# Patient Record
Sex: Male | Born: 1960 | Race: White | Hispanic: No | Marital: Married | Smoking: Former smoker
Health system: Southern US, Community
[De-identification: ages and names within clinical notes are randomized; demographics above are authoritative.]

## PROBLEM LIST (undated history)

## (undated) DIAGNOSIS — M1990 Unspecified osteoarthritis, unspecified site: Secondary | ICD-10-CM

## (undated) DIAGNOSIS — I10 Essential (primary) hypertension: Secondary | ICD-10-CM

## (undated) DIAGNOSIS — Z9889 Other specified postprocedural states: Secondary | ICD-10-CM

## (undated) DIAGNOSIS — R112 Nausea with vomiting, unspecified: Secondary | ICD-10-CM

## (undated) DIAGNOSIS — G4730 Sleep apnea, unspecified: Secondary | ICD-10-CM

## (undated) DIAGNOSIS — Z9289 Personal history of other medical treatment: Secondary | ICD-10-CM

## (undated) DIAGNOSIS — C801 Malignant (primary) neoplasm, unspecified: Secondary | ICD-10-CM

## (undated) DIAGNOSIS — R0989 Other specified symptoms and signs involving the circulatory and respiratory systems: Secondary | ICD-10-CM

## (undated) HISTORY — PX: OTHER SURGICAL HISTORY: SHX169

## (undated) HISTORY — PX: PICC LINE PLACE PERIPHERAL (ARMC HX): HXRAD1248

## (undated) HISTORY — PX: JOINT REPLACEMENT: SHX530

## (undated) HISTORY — PX: TONSILLECTOMY: SUR1361

## (undated) HISTORY — PX: BACK SURGERY: SHX140

---

## 2000-10-14 HISTORY — PX: UVULOPALATOPHARYNGOPLASTY: SHX827

## 2010-04-12 ENCOUNTER — Ambulatory Visit (HOSPITAL_COMMUNITY): Admission: RE | Admit: 2010-04-12 | Discharge: 2010-04-12 | Payer: Self-pay | Admitting: Orthopedic Surgery

## 2010-05-15 ENCOUNTER — Inpatient Hospital Stay (HOSPITAL_COMMUNITY): Admission: RE | Admit: 2010-05-15 | Discharge: 2010-05-18 | Payer: Self-pay | Admitting: Orthopedic Surgery

## 2010-05-19 ENCOUNTER — Inpatient Hospital Stay (HOSPITAL_COMMUNITY): Admission: EM | Admit: 2010-05-19 | Discharge: 2010-05-21 | Payer: Self-pay | Admitting: Emergency Medicine

## 2010-07-02 ENCOUNTER — Encounter
Admission: RE | Admit: 2010-07-02 | Discharge: 2010-09-30 | Payer: Self-pay | Source: Home / Self Care | Attending: Orthopedic Surgery | Admitting: Orthopedic Surgery

## 2010-11-06 ENCOUNTER — Ambulatory Visit
Admission: RE | Admit: 2010-11-06 | Discharge: 2010-11-06 | Payer: Self-pay | Source: Home / Self Care | Admitting: Emergency Medicine

## 2010-11-06 DIAGNOSIS — I10 Essential (primary) hypertension: Secondary | ICD-10-CM | POA: Insufficient documentation

## 2010-11-15 NOTE — Assessment & Plan Note (Signed)
Summary: HEAD CONGESTION/COUGH/SORE THROAT/NH   Vital Signs:  Patient Profile:   50 Years Old Male CC:      congestion x 1 week, sore throat x 1 week Height:     68 inches Weight:      221 pounds O2 Sat:      100 % O2 treatment:    Room Air Temp:     97.6 degrees F oral Pulse rate:   70 / minute Resp:     16 per minute BP sitting:   114 / 73  (left arm) Cuff size:   large  Vitals Entered By: Lajean Saver RN (November 06, 2010 8:23 AM)                  Updated Prior Medication List: AMLODIPINE BESYLATE 10 MG TABS (AMLODIPINE BESYLATE) once daily CARVEDILOL 6.25 MG TABS (CARVEDILOL) bid HYDROCHLOROTHIAZIDE 25 MG TABS (HYDROCHLOROTHIAZIDE)  DIOVAN 40 MG TABS (VALSARTAN)   Current Allergies: ! KEFLEXHistory of Present Illness Chief Complaint: congestion x 1 week, sore throat x 1 week History of Present Illness: 50 Years Old Male complains of onset of cold symptoms for7 days.  [name] has been using no OTC meds + sore throat (main symptom) No cough No pleuritic pain No wheezing + nasal congestion No post-nasal drainage + sinus pain/pressure No chest congestion No itchy/red eyes No earache No hemoptysis No SOB No chills/sweats No fever No nausea No vomiting No abdominal pain No diarrhea No skin rashes No fatigue No myalgias + headache    REVIEW OF SYSTEMS Constitutional Symptoms      Denies fever, chills, night sweats, weight loss, weight gain, and fatigue.  Eyes       Denies change in vision, eye pain, eye discharge, glasses, contact lenses, and eye surgery. Ear/Nose/Throat/Mouth       Complains of sinus problems and sore throat.      Denies hearing loss/aids, change in hearing, ear pain, ear discharge, dizziness, frequent runny nose, frequent nose bleeds, hoarseness, and tooth pain or bleeding.      Comments: congestion Respiratory       Complains of dry cough.      Denies productive cough, wheezing, shortness of breath, asthma, bronchitis, and  emphysema/COPD.  Cardiovascular       Denies murmurs, chest pain, and tires easily with exhertion.    Gastrointestinal       Denies stomach pain, nausea/vomiting, diarrhea, constipation, blood in bowel movements, and indigestion. Genitourniary       Denies painful urination, kidney stones, and loss of urinary control. Neurological       Denies paralysis, seizures, and fainting/blackouts. Musculoskeletal       Denies muscle pain, joint pain, joint stiffness, decreased range of motion, redness, swelling, muscle weakness, and gout.  Skin       Denies bruising, unusual mles/lumps or sores, and hair/skin or nail changes.  Psych       Denies mood changes, temper/anger issues, anxiety/stress, speech problems, depression, and sleep problems. Other Comments: Patient c/o congestion x 1 week and sore throat x 2 days. Minimal cough. Denies fever. No OTC meds   Past History:  Past Medical History: Hypertension  Past Surgical History: Total hip replacement spinal fusion  Social History: Occupation: Med Best boy for [employer] Married Never Smoked Alcohol use-yes Drug use-no Smoking Status:  never Drug Use:  no Physical Exam General appearance: well developed, well nourished, no acute distress Ears: normal, no lesions or deformities Nasal: mucosa pink, nonedematous, no septal deviation,  turbinates normal Oral/Pharynx: clear PND, no erythema Chest/Lungs: no rale or rhonchi bilateral, breath sounds equal without effort.  Mild upper chest wheezes, clear with coughing Heart: regular rate and  rhythm, no murmur Skin: no obvious rashes or lesions MSE: oriented to time, place, and person Assessment New Problems: UPPER RESPIRATORY INFECTION, ACUTE (ICD-465.9) HYPERTENSION (ICD-401.9)   Plan New Medications/Changes: ZITHROMAX Z-PAK 250 MG TABS (AZITHROMYCIN) use as directed  #1 x 0, 11/06/2010, Hoyt Koch MD PREDNISONE (PAK) 10 MG TABS (PREDNISONE) 6 day pack, use as directed  #1 x 0,  11/06/2010, Hoyt Koch MD  New Orders: New Patient Level III [phone] Rapid Strep [32440] Planning Comments:   1)  Take the prescribed antibiotic as instructed (hold for a few days to see if you feel better with the Prednisone) 2)  Use nasal saline solution (over the counter) at least 3 times a day. 3)  Use over the counter decongestants like Zyrtec-D every 12 hours as needed to help with congestion (caution with sudafed since you have HTN) 4)  Can take tylenol every 6 hours or motrin every 8 hours for pain or fever. 5)  Follow up with your primary doctor  if no improvement in 5-7 days, sooner if increasing pain, fever, or new symptoms.    The patient and/or caregiver has been counseled thoroughly with regard to medications prescribed including dosage, schedule, interactions, rationale for use, and possible side effects and they verbalize understanding.  Diagnoses and expected course of recovery discussed and will return if not improved as expected or if the condition worsens. Patient and/or caregiver verbalized understanding.  Prescriptions: ZITHROMAX Z-PAK 250 MG TABS (AZITHROMYCIN) use as directed  #1 x 0   Entered and Authorized by:   Hoyt Koch MD   Signed by:   Hoyt Koch MD on 11/06/2010   Method used:   Print then Give to Patient   RxID:   [id number] PREDNISONE (PAK) 10 MG TABS (PREDNISONE) 6 day pack, use as directed  #1 x 0   Entered and Authorized by:   Hoyt Koch MD   Signed by:   Hoyt Koch MD on 11/06/2010   Method used:   Print then Give to Patient   RxID:   [id number]   Orders Added: 1)  New Patient Level III [18841] 2)  Rapid Strep [66063]    Laboratory Results  Date/Time Received: November 06, 2010 8:28 AM  Date/Time Reported: November 06, 2010 8:28 AM   Other Tests  Rapid Strep: negative  Kit Test Internal QC: Negative   (Normal Range: Negative)

## 2010-12-28 LAB — DIFFERENTIAL
Eosinophils Absolute: 0.1 10*3/uL (ref 0.0–0.7)
Lymphocytes Relative: 18 % (ref 12–46)
Lymphs Abs: 0.7 10*3/uL (ref 0.7–4.0)
Neutro Abs: 2.6 10*3/uL (ref 1.7–7.7)
Neutrophils Relative %: 64 % (ref 43–77)

## 2010-12-28 LAB — CBC
HCT: 26.7 % — ABNORMAL LOW (ref 39.0–52.0)
Hemoglobin: 7.8 g/dL — ABNORMAL LOW (ref 13.0–17.0)
Hemoglobin: 8.9 g/dL — ABNORMAL LOW (ref 13.0–17.0)
MCH: 34.3 pg — ABNORMAL HIGH (ref 26.0–34.0)
MCH: 35 pg — ABNORMAL HIGH (ref 26.0–34.0)
MCHC: 35.4 g/dL (ref 30.0–36.0)
Platelets: 129 10*3/uL — ABNORMAL LOW (ref 150–400)
Platelets: 236 10*3/uL (ref 150–400)
RBC: 2.23 MIL/uL — ABNORMAL LOW (ref 4.22–5.81)
RBC: 2.59 MIL/uL — ABNORMAL LOW (ref 4.22–5.81)
RDW: 15.2 % (ref 11.5–15.5)
RDW: 15.4 % (ref 11.5–15.5)
RDW: 16 % — ABNORMAL HIGH (ref 11.5–15.5)
WBC: 3.8 10*3/uL — ABNORMAL LOW (ref 4.0–10.5)

## 2010-12-28 LAB — URINALYSIS, ROUTINE W REFLEX MICROSCOPIC
Glucose, UA: NEGATIVE mg/dL
Hgb urine dipstick: NEGATIVE
Nitrite: NEGATIVE
Protein, ur: NEGATIVE mg/dL

## 2010-12-28 LAB — ANAEROBIC CULTURE

## 2010-12-28 LAB — TYPE AND SCREEN: Antibody Screen: NEGATIVE

## 2010-12-28 LAB — BASIC METABOLIC PANEL
BUN: 7 mg/dL (ref 6–23)
CO2: 23 mEq/L (ref 19–32)
CO2: 28 mEq/L (ref 19–32)
Calcium: 7.9 mg/dL — ABNORMAL LOW (ref 8.4–10.5)
Calcium: 8.1 mg/dL — ABNORMAL LOW (ref 8.4–10.5)
Chloride: 100 mEq/L (ref 96–112)
Chloride: 96 mEq/L (ref 96–112)
Creatinine, Ser: 0.92 mg/dL (ref 0.4–1.5)
Creatinine, Ser: 1 mg/dL (ref 0.4–1.5)
GFR calc Af Amer: >60 mL/min (ref >60)
GFR calc Af Amer: >60 mL/min (ref >60)
GFR calc non Af Amer: >60 mL/min (ref >60)
GFR calc non Af Amer: >60 mL/min (ref >60)
Glucose, Bld: 112 mg/dL — ABNORMAL HIGH (ref 70–99)
Potassium: 3 mEq/L — ABNORMAL LOW (ref 3.5–5.1)
Potassium: 3.9 mEq/L (ref 3.5–5.1)
Sodium: 133 mEq/L — ABNORMAL LOW (ref 135–145)

## 2010-12-28 LAB — APTT: aPTT: 45 seconds — ABNORMAL HIGH (ref 24–37)

## 2010-12-28 LAB — CULTURE, BLOOD (ROUTINE X 2)

## 2010-12-28 LAB — ABO/RH: ABO/RH(D): A NEG

## 2010-12-28 LAB — PREPARE RBC (CROSSMATCH)

## 2010-12-29 LAB — URINALYSIS, ROUTINE W REFLEX MICROSCOPIC
Glucose, UA: NEGATIVE mg/dL
Ketones, ur: NEGATIVE mg/dL
Nitrite: NEGATIVE
Protein, ur: NEGATIVE mg/dL
pH: 6 (ref 5.0–8.0)

## 2010-12-29 LAB — BASIC METABOLIC PANEL
CO2: 26 mEq/L (ref 19–32)
Calcium: 9.1 mg/dL (ref 8.4–10.5)
Creatinine, Ser: 0.79 mg/dL (ref 0.4–1.5)
GFR calc non Af Amer: >60 mL/min (ref >60)
Glucose, Bld: 91 mg/dL (ref 70–99)
Sodium: 132 mEq/L — ABNORMAL LOW (ref 135–145)

## 2010-12-29 LAB — DIFFERENTIAL
Basophils Absolute: 0 10*3/uL (ref 0.0–0.1)
Basophils Relative: 1 % (ref 0–1)
Eosinophils Absolute: 0 10*3/uL (ref 0.0–0.7)
Monocytes Relative: 16 % — ABNORMAL HIGH (ref 3–12)
Neutro Abs: 1.5 10*3/uL — ABNORMAL LOW (ref 1.7–7.7)
Neutrophils Relative %: 59 % (ref 43–77)

## 2010-12-29 LAB — CBC
Hemoglobin: 12 g/dL — ABNORMAL LOW (ref 13.0–17.0)
MCH: 34.2 pg — ABNORMAL HIGH (ref 26.0–34.0)
MCHC: 35.9 g/dL (ref 30.0–36.0)
Platelets: 245 10*3/uL (ref 150–400)
RDW: 13.9 % (ref 11.5–15.5)

## 2010-12-29 LAB — APTT: aPTT: 36 seconds (ref 24–37)

## 2010-12-29 LAB — SURGICAL PCR SCREEN: Staphylococcus aureus: POSITIVE — AB

## 2010-12-29 LAB — PROTIME-INR
INR: 1.06 (ref 0.00–1.49)
Prothrombin Time: 13.7 seconds (ref 11.6–15.2)

## 2015-05-30 ENCOUNTER — Other Ambulatory Visit (HOSPITAL_COMMUNITY): Payer: Self-pay | Admitting: Orthopedic Surgery

## 2015-05-30 DIAGNOSIS — M25552 Pain in left hip: Secondary | ICD-10-CM

## 2015-06-02 ENCOUNTER — Encounter (HOSPITAL_COMMUNITY)
Admission: RE | Admit: 2015-06-02 | Discharge: 2015-06-02 | Disposition: A | Discharge status: Home / Self Care | Payer: Worker's Compensation | Source: Ambulatory Visit | Attending: Orthopedic Surgery | Admitting: Orthopedic Surgery

## 2015-06-02 ENCOUNTER — Ambulatory Visit (HOSPITAL_COMMUNITY)
Admission: RE | Admit: 2015-06-02 | Discharge: 2015-06-02 | Disposition: A | Discharge status: Home / Self Care | Payer: Worker's Compensation | Source: Ambulatory Visit | Attending: Orthopedic Surgery | Admitting: Orthopedic Surgery

## 2015-06-02 DIAGNOSIS — M25552 Pain in left hip: Secondary | ICD-10-CM | POA: Diagnosis present

## 2015-06-02 DIAGNOSIS — Z96642 Presence of left artificial hip joint: Secondary | ICD-10-CM | POA: Diagnosis not present

## 2015-06-02 MED ORDER — TECHNETIUM TC 99M MEDRONATE IV KIT
25.0000 | PACK | Freq: Once | INTRAVENOUS | Status: AC | PRN
  Administered 2015-06-02: 26.1 via INTRAVENOUS

## 2015-07-06 NOTE — Patient Instructions (Addendum)
Brandom Kerwin  07/06/2015   Your procedure is scheduled on: July 17, 2015  Report to [facility] Main  Entrance take [place]  elevators to 3rd floor to  [employer] at  [employer].   Call this number if you have problems the morning of surgery [phone]   Remember: ONLY 1 PERSON MAY GO WITH YOU TO SHORT STAY TO GET  READY MORNING OF YOUR SURGERY.  Do not eat food after midnight Sunday night.  Follow clear liquid diet until 7:30 a.m. Monday morning.  The nothing by mouth after 7:30 a.m. Morning of surgery    Take these medicines the morning of surgery with A SIP OF WATER: Carvedilol (coreg)                                 You may not have any metal on your body including hair pins and              piercings  Do not wear jewelry, lotions, powders or perfumes, deodorant                       Men may shave face and neck.   Do not bring valuables to the hospital. [place].  Contacts, dentures or bridgework may not be worn into surgery.  Leave suitcase in the car. After surgery it may be brought to your room.    Special Instructions: coughing and deep breathing exercises, leg exercises              Please read over the following fact sheets you were given: _____________________________________________________________________             [facility] - Preparing for Surgery Before surgery, you can play an important role.  Because skin is not sterile, your skin needs to be as free of germs as possible.  You can reduce the number of germs on your skin by washing with CHG (chlorahexidine gluconate) soap before surgery.  CHG is an antiseptic cleaner which kills germs and bonds with the skin to continue killing germs even after washing. Please DO NOT use if you have an allergy to CHG or antibacterial soaps.  If your skin becomes reddened/irritated stop using the CHG and inform your nurse when you arrive at Short  Stay. Do not shave (including legs and underarms) for at least 48 hours prior to the first CHG shower.  You may shave your face/neck. Please follow these instructions carefully:  1.  Shower with CHG Soap the night before surgery and the  morning of Surgery.  2.  If you choose to wash your hair, wash your hair first as usual with your  normal  shampoo.  3.  After you shampoo, rinse your hair and body thoroughly to remove the  shampoo.                           4.  Use CHG as you would any other liquid soap.  You can apply chg directly  to the skin and wash                       Gently  with a scrungie or clean washcloth.  5.  Apply the CHG Soap to your body ONLY FROM THE NECK DOWN.   Do not use on face/ open                           Wound or open sores. Avoid contact with eyes, ears mouth and genitals (private parts).                       Wash face,  Genitals (private parts) with your normal soap.             6.  Wash thoroughly, paying special attention to the area where your surgery  will be performed.  7.  Thoroughly rinse your body with warm water from the neck down.  8.  DO NOT shower/wash with your normal soap after using and rinsing off  the CHG Soap.                9.  Pat yourself dry with a clean towel.            10.  Wear clean pajamas.            11.  Place clean sheets on your bed the night of your first shower and do not  sleep with pets. Day of Surgery : Do not apply any lotions/deodorants the morning of surgery.  Please wear clean clothes to the hospital/surgery center.  FAILURE TO FOLLOW THESE INSTRUCTIONS MAY RESULT IN THE CANCELLATION OF YOUR SURGERY PATIENT SIGNATURE_________________________________  NURSE SIGNATURE__________________________________  ________________________________________________________________________  WHAT IS A BLOOD TRANSFUSION? Blood Transfusion Information  A transfusion is the replacement of blood or some of its parts. Blood is made up of  multiple cells which provide different functions.  Red blood cells carry oxygen and are used for blood loss replacement.  White blood cells fight against infection.  Platelets control bleeding.  Plasma helps clot blood.  Other blood products are available for specialized needs, such as hemophilia or other clotting disorders. BEFORE THE TRANSFUSION  Who gives blood for transfusions?   Healthy volunteers who are fully evaluated to make sure their blood is safe. This is blood bank blood. Transfusion therapy is the safest it has ever been in the practice of medicine. Before blood is taken from a donor, a complete history is taken to make sure that person has no history of diseases nor engages in risky social behavior (examples are intravenous drug use or sexual activity with multiple partners). The donor's travel history is screened to minimize risk of transmitting infections, such as malaria. The donated blood is tested for signs of infectious diseases, such as HIV and hepatitis. The blood is then tested to be sure it is compatible with you in order to minimize the chance of a transfusion reaction. If you or a relative donates blood, this is often done in anticipation of surgery and is not appropriate for emergency situations. It takes many days to process the donated blood. RISKS AND COMPLICATIONS Although transfusion therapy is very safe and saves many lives, the main dangers of transfusion include:  1. Getting an infectious disease. 2. Developing a transfusion reaction. This is an allergic reaction to something in the blood you were given. Every precaution is taken to prevent this. The decision to have a blood transfusion has been considered carefully by your caregiver before blood is given. Blood is not given unless the benefits outweigh the  risks. AFTER THE TRANSFUSION  Right after receiving a blood transfusion, you will usually feel much better and more energetic. This is especially true if  your red blood cells have gotten low (anemic). The transfusion raises the level of the red blood cells which carry oxygen, and this usually causes an energy increase.  The nurse administering the transfusion will monitor you carefully for complications. HOME CARE INSTRUCTIONS  No special instructions are needed after a transfusion. You may find your energy is better. Speak with your caregiver about any limitations on activity for underlying diseases you may have. SEEK MEDICAL CARE IF:   Your condition is not improving after your transfusion.  You develop redness or irritation at the intravenous (IV) site. SEEK IMMEDIATE MEDICAL CARE IF:  Any of the following symptoms occur over the next 12 hours:  Shaking chills.  You have a temperature by mouth above 102 F (38.9 C), not controlled by medicine.  Chest, back, or muscle pain.  People around you feel you are not acting correctly or are confused.  Shortness of breath or difficulty breathing.  Dizziness and fainting.  You get a rash or develop hives.  You have a decrease in urine output.  Your urine turns a dark color or changes to pink, red, or brown. Any of the following symptoms occur over the next 10 days:  You have a temperature by mouth above 102 F (38.9 C), not controlled by medicine.  Shortness of breath.  Weakness after normal activity.  The white part of the eye turns yellow (jaundice).  You have a decrease in the amount of urine or are urinating less often.  Your urine turns a dark color or changes to pink, red, or brown. Document Released: 09/27/2000 Document Revised: 12/23/2011 Document Reviewed: 05/16/2008 ExitCare Patient Information 2014 [place].  _______________________________________________________________________  Incentive Spirometer  An incentive spirometer is a tool that can help keep your lungs clear and active. This tool measures how well you are filling your lungs with each breath.  Taking long deep breaths may help reverse or decrease the chance of developing breathing (pulmonary) problems (especially infection) following:  A long period of time when you are unable to move or be active. BEFORE THE PROCEDURE   If the spirometer includes an indicator to show your best effort, your nurse or respiratory therapist will set it to a desired goal.  If possible, sit up straight or lean slightly forward. Try not to slouch.  Hold the incentive spirometer in an upright position. INSTRUCTIONS FOR USE  3. Sit on the edge of your bed if possible, or sit up as far as you can in bed or on a chair. 4. Hold the incentive spirometer in an upright position. 5. Breathe out normally. 6. Place the mouthpiece in your mouth and seal your lips tightly around it. 7. Breathe in slowly and as deeply as possible, raising the piston or the ball toward the top of the column. 8. Hold your breath for 3-5 seconds or for as long as possible. Allow the piston or ball to fall to the bottom of the column. 9. Remove the mouthpiece from your mouth and breathe out normally. 10. Rest for a few seconds and repeat Steps 1 through 7 at least 10 times every 1-2 hours when you are awake. Take your time and take a few normal breaths between deep breaths. 11. The spirometer may include an indicator to show your best effort. Use the indicator as a goal to work toward  during each repetition. 12. After each set of 10 deep breaths, practice coughing to be sure your lungs are clear. If you have an incision (the cut made at the time of surgery), support your incision when coughing by placing a pillow or rolled up towels firmly against it. Once you are able to get out of bed, walk around indoors and cough well. You may stop using the incentive spirometer when instructed by your caregiver.  RISKS AND COMPLICATIONS  Take your time so you do not get dizzy or light-headed.  If you are in pain, you may need to take or ask for  pain medication before doing incentive spirometry. It is harder to take a deep breath if you are having pain. AFTER USE  Rest and breathe slowly and easily.  It can be helpful to keep track of a log of your progress. Your caregiver can provide you with a simple table to help with this. If you are using the spirometer at home, follow these instructions: [place] IF:   You are having difficultly using the spirometer.  You have trouble using the spirometer as often as instructed.  Your pain medication is not giving enough relief while using the spirometer.  You develop fever of 100.5 F (38.1 C) or higher. SEEK IMMEDIATE MEDICAL CARE IF:   You cough up bloody sputum that had not been present before.  You develop fever of 102 F (38.9 C) or greater.  You develop worsening pain at or near the incision site. MAKE SURE YOU:   Understand these instructions.  Will watch your condition.  Will get help right away if you are not doing well or get worse. Document Released: 02/10/2007 Document Revised: 12/23/2011 Document Reviewed: 04/13/2007 ExitCare Patient Information 2014 ExitCare, [place].   ________________________________________________________________________    CLEAR LIQUID DIET   Foods Allowed                                                                     Foods Excluded  Coffee and tea, regular and decaf                             liquids that you cannot  Plain Jell-O in any flavor                                             see through such as: Fruit ices (not with fruit pulp)                                     milk, soups, orange juice  Iced Popsicles                                    All solid food Carbonated beverages, regular and diet  Cranberry, grape and apple juices Sports drinks like Gatorade Lightly seasoned clear broth or consume(fat free) Sugar, honey syrup  Sample Menu Breakfast                                 Lunch                                     Supper Cranberry juice                    Beef broth                            Chicken broth Jell-O                                     Grape juice                           Apple juice Coffee or tea                        Jell-O                                      Popsicle                                                Coffee or tea                        Coffee or tea  _____________________________________________________________________

## 2015-07-10 ENCOUNTER — Encounter (HOSPITAL_COMMUNITY): Payer: Self-pay

## 2015-07-10 ENCOUNTER — Encounter (INDEPENDENT_AMBULATORY_CARE_PROVIDER_SITE_OTHER): Payer: Self-pay

## 2015-07-10 ENCOUNTER — Encounter (HOSPITAL_COMMUNITY)
Admission: RE | Admit: 2015-07-10 | Discharge: 2015-07-10 | Disposition: A | Discharge status: Home / Self Care | Payer: Worker's Compensation | Source: Ambulatory Visit | Attending: Orthopedic Surgery | Admitting: Orthopedic Surgery

## 2015-07-10 DIAGNOSIS — Z9660 Presence of unspecified orthopedic joint implant: Secondary | ICD-10-CM | POA: Diagnosis not present

## 2015-07-10 DIAGNOSIS — Z01818 Encounter for other preprocedural examination: Secondary | ICD-10-CM | POA: Insufficient documentation

## 2015-07-10 HISTORY — DX: Nausea with vomiting, unspecified: R11.2

## 2015-07-10 HISTORY — DX: Essential (primary) hypertension: I10

## 2015-07-10 HISTORY — DX: Other specified postprocedural states: Z98.890

## 2015-07-10 LAB — URINALYSIS, ROUTINE W REFLEX MICROSCOPIC
BILIRUBIN URINE: NEGATIVE
Glucose, UA: NEGATIVE mg/dL
HGB URINE DIPSTICK: NEGATIVE
Ketones, ur: NEGATIVE mg/dL
Leukocytes, UA: NEGATIVE
Nitrite: NEGATIVE
PROTEIN: NEGATIVE mg/dL
Specific Gravity, Urine: 1.012 (ref 1.005–1.030)
UROBILINOGEN UA: 0.2 mg/dL (ref 0.0–1.0)
pH: 6 (ref 5.0–8.0)

## 2015-07-10 LAB — BASIC METABOLIC PANEL
ANION GAP: 7 (ref 5–15)
BUN: 16 mg/dL (ref 6–20)
CALCIUM: 9 mg/dL (ref 8.9–10.3)
CO2: 22 mmol/L (ref 22–32)
CREATININE: 0.71 mg/dL (ref 0.61–1.24)
Chloride: 101 mmol/L (ref 101–111)
GFR calc Af Amer: >60 mL/min (ref >60)
GFR calc non Af Amer: >60 mL/min (ref >60)
GLUCOSE: 94 mg/dL (ref 65–99)
Potassium: 4.4 mmol/L (ref 3.5–5.1)
Sodium: 130 mmol/L — ABNORMAL LOW (ref 135–145)

## 2015-07-10 LAB — SURGICAL PCR SCREEN
MRSA, PCR: NEGATIVE
STAPHYLOCOCCUS AUREUS: NEGATIVE

## 2015-07-10 LAB — APTT: aPTT: 33 seconds (ref 24–37)

## 2015-07-10 LAB — PROTIME-INR
INR: 1.12 (ref 0.00–1.49)
Prothrombin Time: 14.6 seconds (ref 11.6–15.2)

## 2015-07-10 NOTE — Progress Notes (Signed)
06-21-15 - EKG - in chart 06-21-15 - CBC w/diff - in chart 06-21-15 - CMP - in chart  (drew BMP at preop visit on 07-10-15 due to increased sodium level on 06-21-15) 06-21-15 - LOV - surgical clearance - D. [place], [place] ([facility] - care everywhere - EPIC

## 2015-07-14 NOTE — Progress Notes (Signed)
07-14-15 - Pt. Notified of surgery time change (from 1400 to 1445).  Advised pt. To be at short stay at 1245 on 07-17-15.

## 2015-07-16 MED ORDER — SODIUM CHLORIDE 0.9 % IV SOLN
1500.0000 mg | INTRAVENOUS | Status: AC
  Administered 2015-07-17: 1500 mg via INTRAVENOUS
  Filled 2015-07-16: qty 1500

## 2015-07-16 NOTE — H&P (Signed)
TOTAL HIP REVISION ADMISSION H&P  Patient is admitted for left revision total hip arthroplasty.  Subjective:  Chief Complaint: Failed left THA  HPI: [name], 54 y.o. 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 The indications for the revision total hip arthroplasty are fracture or mechanical failure of one or more component.  Onset of symptoms was gradual starting >10 years ago with gradually worsening course since that time.  Prior procedures on the left hip include arthroplasty.  Patient currently rates pain in the left hip at 9 out of 10 with activity.  There is night pain, worsening of pain with activity and weight bearing, trendelenberg gait, pain that interfers with activities of daily living and pain with passive range of motion. Patient has evidence of malalignment by imaging studies.  This condition presents safety issues increasing the risk of falls.   There is no current active infection.   Risks, benefits and expectations were discussed with the patient.  Risks including but not limited to the risk of anesthesia, blood clots, nerve damage, blood vessel damage, failure of the prosthesis, infection and up to and including death.  Patient understand the risks, benefits and expectations and wishes to proceed with surgery.   PCP: No primary care provider on file.  D/C Plans:      Home with HHPT  Post-op Meds:       No Rx given   Tranexamic Acid:      To be given - IV  Decadron:      Is to be given  FYI:     ASA post-op  Norco post-op    Patient Active Problem List   Diagnosis Date Noted  . HYPERTENSION 11/06/2010   Past Medical History  Diagnosis Date  . PONV (postoperative nausea and vomiting)   . Hypertension   . Pneumonia     hx of    Past  Surgical History  Procedure Laterality Date  . Tonsillectomy      adnoids removed  . Back surgery      four back surgeries (3 fusions)  . Joint replacement      3 surgeries on right hip, 1 on left hip  . Right knee surgery - torn maniscus      No prescriptions prior to admission   Allergies  Allergen Reactions  . Ace Inhibitors Rash  . Cephalexin Rash  . Doxycycline Rash    Social History  Substance Use Topics  . Smoking status: Former Smoker    Quit date: 10/14/2004  . Smokeless tobacco: Never Used  . Alcohol Use: 1.2 oz/week    2 Cans of beer per week     Comment: occasionally    No family history on file.    Review of Systems  Constitutional: Negative.   HENT: Negative.   Eyes: Negative.   Respiratory: Negative.   Cardiovascular: Negative.   Gastrointestinal: Negative.   Genitourinary: Negative.   Musculoskeletal: Positive for back pain and joint pain.  Skin: Negative.   Neurological: Negative.   Endo/Heme/Allergies: Negative.   Psychiatric/Behavioral: Negative.     Objective:  Physical Exam  Constitutional: He is oriented to person, place, and time. He appears well-developed.  HENT:  Head: Normocephalic.  Eyes: Pupils are equal, round, and reactive to light.  Neck: Neck supple. No  JVD present. No tracheal deviation present. No thyromegaly present.  Cardiovascular: Normal rate, regular rhythm, normal heart sounds and intact distal pulses.   Respiratory: Effort normal and breath sounds normal. No stridor. No respiratory distress. He has no wheezes.  GI: Soft. There is no tenderness. There is no guarding.  Musculoskeletal:       Left hip: He exhibits decreased range of motion, decreased strength, tenderness, bony tenderness and laceration (healed previous incision). He exhibits no swelling and no deformity.  Lymphadenopathy:    He has no cervical adenopathy.  Neurological: He is alert and oriented to person, place, and time.  Skin: Skin is warm and dry.   Psychiatric: He has a normal mood and affect.      Labs:  Estimated body mass index is 33.61 kg/(m^2) as calculated from the following:   Height as of 11/06/10: 5\' 8"  (1.727 m).   Weight as of 11/06/10: 100.245 kg (221 lb).  Imaging Review:  Plain radiographs demonstrate previous THA of the left hip(s). There is evidence of failure of the components.The bone quality appears to be good for age and reported activity level.   Assessment/Plan:  Left hip with failed previous arthroplasty.  The patient history, physical examination, clinical judgement of the provider and imaging studies are consistent with failure of the left hip(s), previous total hip arthroplasty. Revision total hip arthroplasty is deemed medically necessary. The treatment options including medical management, injection therapy, arthroscopy and arthroplasty were discussed at length. The risks and benefits of total hip arthroplasty were presented and reviewed. The risks due to aseptic loosening, infection, stiffness, dislocation/subluxation,  thromboembolic complications and other imponderables were discussed.  The patient acknowledged the explanation, agreed to proceed with the plan and consent was signed. Patient is being admitted for inpatient treatment for surgery, pain control, PT, OT, prophylactic antibiotics, VTE prophylaxis, progressive ambulation and ADL's and discharge planning. The patient is planning to be discharged home with home health services.      West Pugh Toris Laverdiere   PA-C  07/16/2015, 10:12 PM

## 2015-07-17 ENCOUNTER — Inpatient Hospital Stay (HOSPITAL_COMMUNITY): Payer: Worker's Compensation | Admitting: Anesthesiology

## 2015-07-17 ENCOUNTER — Encounter (HOSPITAL_COMMUNITY)
Admission: RE | Disposition: A | Discharge status: Home Health | Payer: Self-pay | Source: Ambulatory Visit | Attending: Orthopedic Surgery

## 2015-07-17 ENCOUNTER — Inpatient Hospital Stay (HOSPITAL_COMMUNITY)
Admission: RE | Admit: 2015-07-17 | Discharge: 2015-07-19 | DRG: 468 | Disposition: A | Discharge status: Home Health | Payer: Worker's Compensation | Source: Ambulatory Visit | Attending: Orthopedic Surgery | Admitting: Orthopedic Surgery

## 2015-07-17 ENCOUNTER — Inpatient Hospital Stay (HOSPITAL_COMMUNITY): Payer: Worker's Compensation

## 2015-07-17 ENCOUNTER — Encounter (HOSPITAL_COMMUNITY): Payer: Self-pay | Admitting: *Deleted

## 2015-07-17 DIAGNOSIS — Z96649 Presence of unspecified artificial hip joint: Secondary | ICD-10-CM

## 2015-07-17 DIAGNOSIS — Z01812 Encounter for preprocedural laboratory examination: Secondary | ICD-10-CM | POA: Diagnosis not present

## 2015-07-17 DIAGNOSIS — E669 Obesity, unspecified: Secondary | ICD-10-CM | POA: Diagnosis present

## 2015-07-17 DIAGNOSIS — T84091A Other mechanical complication of internal left hip prosthesis, initial encounter: Secondary | ICD-10-CM | POA: Diagnosis present

## 2015-07-17 DIAGNOSIS — Z881 Allergy status to other antibiotic agents status: Secondary | ICD-10-CM | POA: Diagnosis not present

## 2015-07-17 DIAGNOSIS — Z6835 Body mass index (BMI) 35.0-35.9, adult: Secondary | ICD-10-CM

## 2015-07-17 DIAGNOSIS — I10 Essential (primary) hypertension: Secondary | ICD-10-CM | POA: Diagnosis present

## 2015-07-17 DIAGNOSIS — Z87891 Personal history of nicotine dependence: Secondary | ICD-10-CM | POA: Diagnosis not present

## 2015-07-17 DIAGNOSIS — Y792 Prosthetic and other implants, materials and accessory orthopedic devices associated with adverse incidents: Secondary | ICD-10-CM | POA: Diagnosis present

## 2015-07-17 DIAGNOSIS — M25552 Pain in left hip: Secondary | ICD-10-CM | POA: Diagnosis present

## 2015-07-17 DIAGNOSIS — T84018A Broken internal joint prosthesis, other site, initial encounter: Secondary | ICD-10-CM

## 2015-07-17 HISTORY — PX: ANTERIOR HIP REVISION: SHX6527

## 2015-07-17 LAB — TYPE AND SCREEN
ABO/RH(D): A NEG
Antibody Screen: NEGATIVE

## 2015-07-17 SURGERY — REVISION, TOTAL ARTHROPLASTY, HIP, ANTERIOR APPROACH
Anesthesia: General | Site: Hip | Laterality: Left

## 2015-07-17 MED ORDER — IRBESARTAN 300 MG PO TABS
300.0000 mg | ORAL_TABLET | Freq: Every day | ORAL | Status: DC
  Administered 2015-07-17 – 2015-07-19 (×3): 300 mg via ORAL
  Filled 2015-07-17 (×3): qty 1

## 2015-07-17 MED ORDER — ONDANSETRON HCL 4 MG/2ML IJ SOLN
INTRAMUSCULAR | Status: DC | PRN
  Administered 2015-07-17: 4 mg via INTRAVENOUS

## 2015-07-17 MED ORDER — DEXAMETHASONE SODIUM PHOSPHATE 10 MG/ML IJ SOLN
INTRAMUSCULAR | Status: DC | PRN
  Administered 2015-07-17: 10 mg via INTRAVENOUS

## 2015-07-17 MED ORDER — FERROUS SULFATE 325 (65 FE) MG PO TABS
325.0000 mg | ORAL_TABLET | Freq: Three times a day (TID) | ORAL | Status: DC
  Administered 2015-07-18 – 2015-07-19 (×4): 325 mg via ORAL
  Filled 2015-07-17 (×7): qty 1

## 2015-07-17 MED ORDER — DIPHENHYDRAMINE HCL 25 MG PO CAPS
25.0000 mg | ORAL_CAPSULE | Freq: Four times a day (QID) | ORAL | Status: DC | PRN
  Administered 2015-07-18: 25 mg via ORAL
  Filled 2015-07-17: qty 1

## 2015-07-17 MED ORDER — MEPERIDINE HCL 50 MG/ML IJ SOLN: 6.2500 mg | INTRAMUSCULAR | Status: DC | PRN

## 2015-07-17 MED ORDER — FENTANYL CITRATE (PF) 100 MCG/2ML IJ SOLN
INTRAMUSCULAR | Status: DC | PRN
  Administered 2015-07-17: 100 ug via INTRAVENOUS
  Administered 2015-07-17: 50 ug via INTRAVENOUS
  Administered 2015-07-17 (×2): 100 ug via INTRAVENOUS

## 2015-07-17 MED ORDER — PROMETHAZINE HCL 25 MG/ML IJ SOLN: 6.2500 mg | INTRAMUSCULAR | Status: DC | PRN

## 2015-07-17 MED ORDER — NEOSTIGMINE METHYLSULFATE 10 MG/10ML IV SOLN
INTRAVENOUS | Status: AC
  Filled 2015-07-17: qty 1

## 2015-07-17 MED ORDER — FENTANYL CITRATE (PF) 100 MCG/2ML IJ SOLN
INTRAMUSCULAR | Status: AC
  Filled 2015-07-17: qty 4

## 2015-07-17 MED ORDER — CELECOXIB 200 MG PO CAPS
200.0000 mg | ORAL_CAPSULE | Freq: Two times a day (BID) | ORAL | Status: DC
  Administered 2015-07-17 – 2015-07-19 (×4): 200 mg via ORAL
  Filled 2015-07-17 (×5): qty 1

## 2015-07-17 MED ORDER — DOCUSATE SODIUM 100 MG PO CAPS
100.0000 mg | ORAL_CAPSULE | Freq: Two times a day (BID) | ORAL | Status: DC
  Administered 2015-07-17 – 2015-07-19 (×4): 100 mg via ORAL

## 2015-07-17 MED ORDER — LIDOCAINE HCL (CARDIAC) 20 MG/ML IV SOLN
INTRAVENOUS | Status: DC | PRN
  Administered 2015-07-17: 100 mg via INTRAVENOUS

## 2015-07-17 MED ORDER — LACTATED RINGERS IV SOLN
INTRAVENOUS | Status: DC
  Administered 2015-07-17 (×3): via INTRAVENOUS

## 2015-07-17 MED ORDER — ROCURONIUM BROMIDE 100 MG/10ML IV SOLN
INTRAVENOUS | Status: AC
  Filled 2015-07-17: qty 1

## 2015-07-17 MED ORDER — EPHEDRINE SULFATE 50 MG/ML IJ SOLN
INTRAMUSCULAR | Status: DC | PRN
  Administered 2015-07-17: 5 mg via INTRAVENOUS

## 2015-07-17 MED ORDER — PROPOFOL 10 MG/ML IV BOLUS
INTRAVENOUS | Status: DC | PRN
  Administered 2015-07-17: 170 mg via INTRAVENOUS
  Administered 2015-07-17: 200 mg via INTRAVENOUS

## 2015-07-17 MED ORDER — DEXAMETHASONE SODIUM PHOSPHATE 10 MG/ML IJ SOLN
INTRAMUSCULAR | Status: AC
  Filled 2015-07-17: qty 1

## 2015-07-17 MED ORDER — FENTANYL CITRATE (PF) 100 MCG/2ML IJ SOLN
INTRAMUSCULAR | Status: AC
  Filled 2015-07-17: qty 2

## 2015-07-17 MED ORDER — MIDAZOLAM HCL 5 MG/5ML IJ SOLN
INTRAMUSCULAR | Status: DC | PRN
  Administered 2015-07-17: 2 mg via INTRAVENOUS

## 2015-07-17 MED ORDER — PHENOL 1.4 % MT LIQD: 1.0000 | OROMUCOSAL | Status: DC | PRN

## 2015-07-17 MED ORDER — GLYCOPYRROLATE 0.2 MG/ML IJ SOLN
INTRAMUSCULAR | Status: AC
  Filled 2015-07-17: qty 1

## 2015-07-17 MED ORDER — HYDROMORPHONE HCL 1 MG/ML IJ SOLN
0.5000 mg | INTRAMUSCULAR | Status: DC | PRN
  Administered 2015-07-17: 0.5 mg via INTRAVENOUS
  Administered 2015-07-17: 1 mg via INTRAVENOUS
  Filled 2015-07-17 (×2): qty 1

## 2015-07-17 MED ORDER — MENTHOL 3 MG MT LOZG: 1.0000 | LOZENGE | OROMUCOSAL | Status: DC | PRN

## 2015-07-17 MED ORDER — LACTATED RINGERS IV SOLN: INTRAVENOUS | Status: DC

## 2015-07-17 MED ORDER — INFLUENZA VAC SPLIT QUAD 0.5 ML IM SUSY
0.5000 mL | PREFILLED_SYRINGE | INTRAMUSCULAR | Status: DC
  Filled 2015-07-17 (×2): qty 0.5

## 2015-07-17 MED ORDER — SODIUM CHLORIDE 0.9 % IV SOLN
100.0000 mL/h | INTRAVENOUS | Status: DC
  Administered 2015-07-17: 100 mL/h via INTRAVENOUS
  Filled 2015-07-17 (×6): qty 1000

## 2015-07-17 MED ORDER — SODIUM CHLORIDE 0.9 % IR SOLN
Status: DC | PRN
  Administered 2015-07-17: 1000 mL

## 2015-07-17 MED ORDER — CETYLPYRIDINIUM CHLORIDE 0.05 % MT LIQD
7.0000 mL | Freq: Two times a day (BID) | OROMUCOSAL | Status: DC
  Administered 2015-07-17 – 2015-07-19 (×4): 7 mL via OROMUCOSAL

## 2015-07-17 MED ORDER — BISACODYL 10 MG RE SUPP: 10.0000 mg | Freq: Every day | RECTAL | Status: DC | PRN

## 2015-07-17 MED ORDER — GLYCOPYRROLATE 0.2 MG/ML IJ SOLN
INTRAMUSCULAR | Status: AC
  Filled 2015-07-17: qty 2

## 2015-07-17 MED ORDER — METHOCARBAMOL 1000 MG/10ML IJ SOLN
500.0000 mg | Freq: Four times a day (QID) | INTRAVENOUS | Status: DC | PRN
  Administered 2015-07-17: 500 mg via INTRAVENOUS
  Filled 2015-07-17 (×2): qty 5

## 2015-07-17 MED ORDER — MAGNESIUM CITRATE PO SOLN: 1.0000 | Freq: Once | ORAL | Status: DC | PRN

## 2015-07-17 MED ORDER — HYDROCODONE-ACETAMINOPHEN 7.5-325 MG PO TABS
1.0000 | ORAL_TABLET | ORAL | Status: DC
  Administered 2015-07-17: 1 via ORAL
  Administered 2015-07-18 (×6): 2 via ORAL
  Administered 2015-07-19 (×2): 1 via ORAL
  Administered 2015-07-19: 2 via ORAL
  Administered 2015-07-19: 1 via ORAL
  Filled 2015-07-17: qty 1
  Filled 2015-07-17 (×5): qty 2
  Filled 2015-07-17 (×2): qty 1
  Filled 2015-07-17 (×2): qty 2
  Filled 2015-07-17: qty 1

## 2015-07-17 MED ORDER — METOCLOPRAMIDE HCL 5 MG PO TABS
5.0000 mg | ORAL_TABLET | Freq: Three times a day (TID) | ORAL | Status: DC | PRN
  Filled 2015-07-17: qty 2

## 2015-07-17 MED ORDER — ROCURONIUM BROMIDE 100 MG/10ML IV SOLN
INTRAVENOUS | Status: DC | PRN
  Administered 2015-07-17: 40 mg via INTRAVENOUS

## 2015-07-17 MED ORDER — ONDANSETRON HCL 4 MG/2ML IJ SOLN
INTRAMUSCULAR | Status: AC
  Filled 2015-07-17: qty 2

## 2015-07-17 MED ORDER — TRANEXAMIC ACID 1000 MG/10ML IV SOLN
1000.0000 mg | Freq: Once | INTRAVENOUS | Status: AC
  Administered 2015-07-17: 1000 mg via INTRAVENOUS
  Filled 2015-07-17: qty 10

## 2015-07-17 MED ORDER — METOCLOPRAMIDE HCL 5 MG/ML IJ SOLN: 5.0000 mg | Freq: Three times a day (TID) | INTRAMUSCULAR | Status: DC | PRN

## 2015-07-17 MED ORDER — CHLORHEXIDINE GLUCONATE 4 % EX LIQD: 60.0000 mL | Freq: Once | CUTANEOUS | Status: DC

## 2015-07-17 MED ORDER — FENTANYL CITRATE (PF) 100 MCG/2ML IJ SOLN
25.0000 ug | INTRAMUSCULAR | Status: DC | PRN
  Administered 2015-07-17 (×4): 25 ug via INTRAVENOUS

## 2015-07-17 MED ORDER — PROPOFOL 10 MG/ML IV BOLUS
INTRAVENOUS | Status: AC
  Filled 2015-07-17: qty 20

## 2015-07-17 MED ORDER — ONDANSETRON HCL 4 MG PO TABS: 4.0000 mg | ORAL_TABLET | Freq: Four times a day (QID) | ORAL | Status: DC | PRN

## 2015-07-17 MED ORDER — LIDOCAINE HCL (CARDIAC) 20 MG/ML IV SOLN
INTRAVENOUS | Status: AC
  Filled 2015-07-17: qty 5

## 2015-07-17 MED ORDER — METHOCARBAMOL 500 MG PO TABS
500.0000 mg | ORAL_TABLET | Freq: Four times a day (QID) | ORAL | Status: DC | PRN
  Administered 2015-07-19: 500 mg via ORAL
  Filled 2015-07-17 (×2): qty 1

## 2015-07-17 MED ORDER — ASPIRIN EC 325 MG PO TBEC
325.0000 mg | DELAYED_RELEASE_TABLET | Freq: Two times a day (BID) | ORAL | Status: DC
  Administered 2015-07-18 – 2015-07-19 (×3): 325 mg via ORAL
  Filled 2015-07-17 (×5): qty 1

## 2015-07-17 MED ORDER — VANCOMYCIN HCL 10 G IV SOLR
1500.0000 mg | Freq: Two times a day (BID) | INTRAVENOUS | Status: AC
  Administered 2015-07-18: 1500 mg via INTRAVENOUS
  Filled 2015-07-17: qty 1500

## 2015-07-17 MED ORDER — NEOSTIGMINE METHYLSULFATE 10 MG/10ML IV SOLN
INTRAVENOUS | Status: DC | PRN
  Administered 2015-07-17: 5 mg via INTRAVENOUS

## 2015-07-17 MED ORDER — POLYETHYLENE GLYCOL 3350 17 G PO PACK
17.0000 g | PACK | Freq: Two times a day (BID) | ORAL | Status: DC
  Administered 2015-07-17 – 2015-07-18 (×3): 17 g via ORAL

## 2015-07-17 MED ORDER — DEXAMETHASONE SODIUM PHOSPHATE 10 MG/ML IJ SOLN: 10.0000 mg | Freq: Once | INTRAMUSCULAR | Status: DC

## 2015-07-17 MED ORDER — GLYCOPYRROLATE 0.2 MG/ML IJ SOLN
INTRAMUSCULAR | Status: DC | PRN
  Administered 2015-07-17: 0.6 mg via INTRAVENOUS
  Administered 2015-07-17: 0.4 mg via INTRAVENOUS

## 2015-07-17 MED ORDER — MIDAZOLAM HCL 2 MG/2ML IJ SOLN
INTRAMUSCULAR | Status: AC
  Filled 2015-07-17: qty 4

## 2015-07-17 MED ORDER — ALUM & MAG HYDROXIDE-SIMETH 200-200-20 MG/5ML PO SUSP: 30.0000 mL | ORAL | Status: DC | PRN

## 2015-07-17 MED ORDER — FENTANYL CITRATE (PF) 250 MCG/5ML IJ SOLN
INTRAMUSCULAR | Status: AC
  Filled 2015-07-17: qty 25

## 2015-07-17 MED ORDER — CARVEDILOL 12.5 MG PO TABS
12.5000 mg | ORAL_TABLET | Freq: Two times a day (BID) | ORAL | Status: DC
  Administered 2015-07-17 – 2015-07-19 (×4): 12.5 mg via ORAL
  Filled 2015-07-17 (×7): qty 1

## 2015-07-17 MED ORDER — SUCCINYLCHOLINE CHLORIDE 20 MG/ML IJ SOLN
INTRAMUSCULAR | Status: DC | PRN
  Administered 2015-07-17 (×2): 100 mg via INTRAVENOUS

## 2015-07-17 MED ORDER — ONDANSETRON HCL 4 MG/2ML IJ SOLN: 4.0000 mg | Freq: Four times a day (QID) | INTRAMUSCULAR | Status: DC | PRN

## 2015-07-17 MED ORDER — DEXAMETHASONE SODIUM PHOSPHATE 10 MG/ML IJ SOLN
10.0000 mg | Freq: Once | INTRAMUSCULAR | Status: AC
  Administered 2015-07-18: 10 mg via INTRAVENOUS
  Filled 2015-07-17: qty 1

## 2015-07-17 SURGICAL SUPPLY — 49 items
BAG ZIPLOCK 12X15 (MISCELLANEOUS) IMPLANT
COVER PERINEAL POST (MISCELLANEOUS) IMPLANT
DRAPE C-ARM 42X120 X-RAY (DRAPES) IMPLANT
DRAPE STERI IOBAN 125X83 (DRAPES) ×2 IMPLANT
DRAPE SURG 17X11 SM STRL (DRAPES) ×2 IMPLANT
DRAPE U-SHAPE 47X51 STRL (DRAPES) ×2 IMPLANT
DRSG AQUACEL AG ADV 3.5X10 (GAUZE/BANDAGES/DRESSINGS) IMPLANT
DRSG AQUACEL AG ADV 3.5X14 (GAUZE/BANDAGES/DRESSINGS) ×2 IMPLANT
DURAPREP 26ML APPLICATOR (WOUND CARE) ×2 IMPLANT
ELECT BLADE TIP CTD 4 INCH (ELECTRODE) IMPLANT
ELECT PENCIL ROCKER SW 15FT (MISCELLANEOUS) IMPLANT
ELECT REM PT RETURN 15FT ADLT (MISCELLANEOUS) IMPLANT
ELECT REM PT RETURN 9FT ADLT (ELECTROSURGICAL) ×2
ELECTRODE REM PT RTRN 9FT ADLT (ELECTROSURGICAL) ×1 IMPLANT
FACESHIELD WRAPAROUND (MASK) ×10 IMPLANT
GLOVE BIOGEL PI IND STRL 7.5 (GLOVE) ×1 IMPLANT
GLOVE BIOGEL PI IND STRL 8.5 (GLOVE) ×1 IMPLANT
GLOVE BIOGEL PI INDICATOR 7.5 (GLOVE) ×1
GLOVE BIOGEL PI INDICATOR 8.5 (GLOVE) ×1
GLOVE ECLIPSE 8.0 STRL XLNG CF (GLOVE) ×4 IMPLANT
GLOVE ORTHO TXT STRL SZ7.5 (GLOVE) ×4 IMPLANT
GOWN SPEC L3 XXLG W/TWL (GOWN DISPOSABLE) ×2 IMPLANT
GOWN STRL REUS W/TWL LRG LVL3 (GOWN DISPOSABLE) ×2 IMPLANT
GRAFT IC CHAMBER LG (Bone Implant) ×2 IMPLANT
HOLDER FOLEY CATH W/STRAP (MISCELLANEOUS) ×2 IMPLANT
INSERT REST ADM X3 28 28/52 (Orthopedic Implant) ×2 IMPLANT
KIT BASIN OR (CUSTOM PROCEDURE TRAY) ×2 IMPLANT
LINER MDM 46MM (Orthopedic Implant) ×2 IMPLANT
LIQUID BAND (GAUZE/BANDAGES/DRESSINGS) ×2 IMPLANT
PACK TOTAL JOINT (CUSTOM PROCEDURE TRAY) ×2 IMPLANT
SAW OSC TIP CART 19.5X105X1.3 (SAW) IMPLANT
SCREW GAP PLATE REST 6.5X15MM (Screw) ×2 IMPLANT
SCREW GAP PLATE REST 6.5X25MM (Screw) ×4 IMPLANT
SCREW GAP PLATE REST 6.5X30MM (Screw) ×2 IMPLANT
SHELL HEMI TRID TRITANIUM 58 (Shell) ×2 IMPLANT
SUCTION FRAZIER 12FR DISP (SUCTIONS) ×2 IMPLANT
SUT MNCRL 3 0 VIOLET RB1 (SUTURE) ×1 IMPLANT
SUT MNCRL AB 3-0 PS2 18 (SUTURE) ×2 IMPLANT
SUT MNCRL AB 4-0 PS2 18 (SUTURE) ×2 IMPLANT
SUT MONOCRYL 3 0 RB1 (SUTURE) ×1
SUT VIC AB 1 CT1 36 (SUTURE) ×8 IMPLANT
SUT VIC AB 2-0 CT1 27 (SUTURE) ×2
SUT VIC AB 2-0 CT1 TAPERPNT 27 (SUTURE) ×2 IMPLANT
SUT VLOC 180 0 24IN GS25 (SUTURE) ×2 IMPLANT
TOWEL OR 17X26 10 PK STRL BLUE (TOWEL DISPOSABLE) ×4 IMPLANT
TOWEL OR NON WOVEN STRL DISP B (DISPOSABLE) ×2 IMPLANT
TRAY FOLEY W/METER SILVER 14FR (SET/KITS/TRAYS/PACK) IMPLANT
TRAY FOLEY W/METER SILVER 16FR (SET/KITS/TRAYS/PACK) ×2 IMPLANT
WATER STERILE IRR 1500ML POUR (IV SOLUTION) ×2 IMPLANT

## 2015-07-17 NOTE — Anesthesia Preprocedure Evaluation (Addendum)
Anesthesia Evaluation  Patient identified by MRN, date of birth, ID band Patient awake    Reviewed: Allergy & Precautions, NPO status , Patient's Chart, lab work & pertinent test results  History of Anesthesia Complications (+) PONV  Airway Mallampati: II  TM Distance: >3 FB Neck ROM: Full    Dental no notable dental hx.    Pulmonary former smoker,    Pulmonary exam normal breath sounds clear to auscultation       Cardiovascular hypertension, Pt. on medications Normal cardiovascular exam Rhythm:Regular Rate:Normal     Neuro/Psych negative neurological ROS  negative psych ROS   GI/Hepatic negative GI ROS, Neg liver ROS,   Endo/Other  negative endocrine ROS  Renal/GU negative Renal ROS  negative genitourinary   Musculoskeletal negative musculoskeletal ROS (+)   Abdominal   Peds negative pediatric ROS (+)  Hematology negative hematology ROS (+)   Anesthesia Other Findings   Reproductive/Obstetrics negative OB ROS                            Anesthesia Physical Anesthesia Plan  ASA: II  Anesthesia Plan: General   Post-op Pain Management:    Induction: Intravenous  Airway Management Planned: Oral ETT  Additional Equipment:   Intra-op Plan:   Post-operative Plan: Extubation in OR  Informed Consent: I have reviewed the patients History and Physical, chart, labs and discussed the procedure including the risks, benefits and alternatives for the proposed anesthesia with the patient or authorized representative who has indicated his/her understanding and acceptance.   Dental advisory given  Plan Discussed with: CRNA  Anesthesia Plan Comments: (S/p spinal fusions L3-S1. Not a candidate for spinal)        Anesthesia Quick Evaluation

## 2015-07-17 NOTE — Anesthesia Postprocedure Evaluation (Signed)
  Anesthesia Post-op Note  Patient: [name]  Procedure(s) Performed: Procedure(s): LEFT POSTERIOR  HIP REVISION (Left)  Patient Location: PACU  Anesthesia Type:General  Level of Consciousness: awake  Airway and Oxygen Therapy: Patient Spontanous Breathing  Post-op Pain: mild  Post-op Assessment: Post-op Vital signs reviewed LLE Motor Response: Purposeful movement LLE Sensation: Full sensation RLE Motor Response: Purposeful movement RLE Sensation: Full sensation L Sensory Level: S1-Sole of foot, small toes R Sensory Level: S1-Sole of foot, small toes  Post-op Vital Signs: Reviewed  Last Vitals:  Filed Vitals:   07/17/15 2035  BP: 186/67  Pulse: 60  Temp: 36.5 C  Resp: 16    Complications: No apparent anesthesia complications

## 2015-07-17 NOTE — Brief Op Note (Signed)
07/17/2015  5:20 PM  PATIENT:  [name]  54 y.o. male  PRE-OPERATIVE DIAGNOSIS:  failed left total hip, aseptic loosening of the acetabular component  POST-OPERATIVE DIAGNOSIS:  failed left total hip, aseptic loosening of the acetabular component  PROCEDURE:  Procedure(s): Revision left total hip replacement   SURGEON:  Surgeon(s) and Role:    * Paralee Cancel, MD - Primary  PHYSICIAN ASSISTANT: Danae Orleans, PA-C  ANESTHESIA:   general  EBL:  Total I/O In: 2000 [I.V.:2000] Out: 950 [Urine:600; Blood:350]  BLOOD ADMINISTERED:none1  DRAINS: none   LOCAL MEDICATIONS USED:  NONE  SPECIMEN:  No Specimen  DISPOSITION OF SPECIMEN:  N/A  COUNTS:  YES  TOURNIQUET:  * No tourniquets in log *  DICTATION: .Other Dictation: Dictation Number [id number]  PLAN OF CARE: Admit to inpatient   PATIENT DISPOSITION:  PACU - hemodynamically stable.   Delay start of Pharmacological VTE agent (>24hrs) due to surgical blood loss or risk of bleeding: no

## 2015-07-17 NOTE — Transfer of Care (Signed)
Immediate Anesthesia Transfer of Care Note  Patient: [name]  Procedure(s) Performed: Procedure(s): LEFT POSTERIOR  HIP REVISION (Left)  Patient Location: PACU  Anesthesia Type:General  Level of Consciousness: sedated  Airway & Oxygen Therapy: Patient Spontanous Breathing and Patient connected to face mask oxygen  Post-op Assessment: Report given to RN and Post -op Vital signs reviewed and stable  Post vital signs: Reviewed and stable  Last Vitals:  Filed Vitals:   07/17/15 1306  BP: 187/80  Pulse:   Temp:   Resp:     Complications: No apparent anesthesia complications

## 2015-07-17 NOTE — Anesthesia Procedure Notes (Signed)
Procedure Name: Intubation Date/Time: 07/17/2015 3:30 PM Performed by: Lind Covert Pre-anesthesia Checklist: Patient identified, Emergency Drugs available, Suction available, Patient being monitored and Timeout performed Patient Re-evaluated:Patient Re-evaluated prior to inductionOxygen Delivery Method: Circle system utilized Preoxygenation: Pre-oxygenation with 100% oxygen Intubation Type: IV induction Ventilation: Oral airway inserted - appropriate to patient size Laryngoscope Size: Glidescope and 3 Grade View: Grade II Tube type: Oral Tube size: 7.5 mm Number of attempts: 3 (Tried once by [employer], once by CRNA, and lastly by MDA with glidescope) Airway Equipment and Method: Video-laryngoscopy Placement Confirmation: ETT inserted through vocal cords under direct vision,  positive ETCO2 and breath sounds checked- equal and bilateral Secured at: 21 cm Tube secured with: Tape Dental Injury: Teeth and Oropharynx as per pre-operative assessment  Difficulty Due To: Difficulty was unanticipated Future Recommendations: Recommend- induction with short-acting agent, and alternative techniques readily available Comments: Pt was unexpected difficult airway, direct visualization of cords via MAC 4 was not successful, Attempt with bougie unsuccessful. Glidescope gave grade 2 view with successful intubation. Pt was a 2 hand mask with oral airway.

## 2015-07-17 NOTE — Interval H&P Note (Signed)
History and Physical Interval Note:  07/17/2015 2:27 PM  [name]  has presented today for surgery, with the diagnosis of failed left total hip  The various methods of treatment have been discussed with the patient and family. After consideration of risks, benefits and other options for treatment, the patient has consented to  Procedure(s): LEFT POSTERIOR  HIP REVISION (Left) as a surgical intervention .  The patient's history has been reviewed, patient examined, no change in status, stable for surgery.  I have reviewed the patient's chart and labs.  Questions were answered to the patient's satisfaction.     [name]

## 2015-07-18 ENCOUNTER — Encounter (HOSPITAL_COMMUNITY): Payer: Self-pay | Admitting: Orthopedic Surgery

## 2015-07-18 LAB — BASIC METABOLIC PANEL
ANION GAP: 8 (ref 5–15)
BUN: 9 mg/dL (ref 6–20)
CALCIUM: 8.9 mg/dL (ref 8.9–10.3)
CO2: 25 mmol/L (ref 22–32)
CREATININE: 0.75 mg/dL (ref 0.61–1.24)
Chloride: 98 mmol/L — ABNORMAL LOW (ref 101–111)
GFR calc Af Amer: >60 mL/min (ref >60)
Glucose, Bld: 158 mg/dL — ABNORMAL HIGH (ref 65–99)
Potassium: 4.3 mmol/L (ref 3.5–5.1)
SODIUM: 131 mmol/L — AB (ref 135–145)

## 2015-07-18 LAB — CBC
HEMATOCRIT: 33.3 % — AB (ref 39.0–52.0)
Hemoglobin: 11.4 g/dL — ABNORMAL LOW (ref 13.0–17.0)
MCH: 33.3 pg (ref 26.0–34.0)
MCHC: 34.2 g/dL (ref 30.0–36.0)
MCV: 97.4 fL (ref 78.0–100.0)
PLATELETS: 187 10*3/uL (ref 150–400)
RBC: 3.42 MIL/uL — ABNORMAL LOW (ref 4.22–5.81)
RDW: 13.4 % (ref 11.5–15.5)
WBC: 9.9 10*3/uL (ref 4.0–10.5)

## 2015-07-18 NOTE — Evaluation (Signed)
Physical Therapy Evaluation Patient Details Name: Haydyn Girvan MRN: [id number] DOB: 03/26/1961 Today's Date: 07/18/2015   History of Present Illness  54 yo male s/p L TH revision 07/17/15. Hx of R TH revision.  Clinical Impression  On eval, pt was Min guard assist for mobility-walked ~65 feet with RW. Pain rated 2/10. Pt tolerated activity well.     Follow Up Recommendations Home health PT    Equipment Recommendations  None recommended by PT    Recommendations for Other Services       Precautions / Restrictions Precautions Precautions: None Restrictions Weight Bearing Restrictions: Yes LLE Weight Bearing: Partial weight bearing LLE Partial Weight Bearing Percentage or Pounds: 25-50%      Mobility  Bed Mobility Overal bed mobility: Needs Assistance Bed Mobility: Supine to Sit     Supine to sit: Min guard;HOB elevated     General bed mobility comments: close guard for safety. VCs safety, adherence to precautions.   Transfers Overall transfer level: Needs assistance Equipment used: Rolling walker (2 wheeled) Transfers: Sit to/from Stand Sit to Stand: Min guard         General transfer comment: close guard for safety. VCs safety, technique, hand placement.  Ambulation/Gait Ambulation/Gait assistance: Min guard Ambulation Distance (Feet): 65 Feet Assistive device: Rolling walker (2 wheeled) Gait Pattern/deviations: Step-to pattern     General Gait Details: VCs for sequence, adherence WB status, and proper use of RW. Pt states he prefers to pick walker up instead of rolling it.   Stairs            Wheelchair Mobility    Modified Rankin (Stroke Patients Only)       Balance                                             Pertinent Vitals/Pain Pain Assessment: 0-10 Pain Score: 2  Pain Location: L hip Pain Descriptors / Indicators: Sore Pain Intervention(s): Monitored during session;Ice applied;Repositioned    Home Living  Family/patient expects to be discharged to:: Private residence Living Arrangements: Spouse/significant other Available Help at Discharge: Family Type of Home: House Home Access: Stairs to enter   Technical brewer of Steps: 1 Home Layout: Able to live on main level with bedroom/bathroom;Two level Home Equipment: Walker - 2 wheels;Adaptive equipment;Crutches      Prior Function Level of Independence: Independent               Hand Dominance        Extremity/Trunk Assessment   Upper Extremity Assessment: Defer to OT evaluation           Lower Extremity Assessment: LLE deficits/detail   LLE Deficits / Details: moves ankle well. LE strength at least 3/5 throughout except hip flex ~2/5  Cervical / Trunk Assessment: Normal  Communication   Communication: No difficulties  Cognition Arousal/Alertness: Awake/alert Behavior During Therapy: WFL for tasks assessed/performed Overall Cognitive Status: Within Functional Limits for tasks assessed                      General Comments      Exercises        Assessment/Plan    PT Assessment Patient needs continued PT services  PT Diagnosis Difficulty walking;Acute pain   PT Problem List Decreased strength;Decreased range of motion;Decreased activity tolerance;Decreased balance;Decreased mobility;Pain;Decreased knowledge of use of DME  PT Treatment  Interventions DME instruction;Gait training;Stair training;Functional mobility training;Therapeutic activities;Patient/family education;Balance training;Therapeutic exercise   PT Goals (Current goals can be found in the Care Plan section) Acute Rehab PT Goals Patient Stated Goal: home. regain PLOF PT Goal Formulation: With patient Time For Goal Achievement: 07/25/15 Potential to Achieve Goals: Good    Frequency 7X/week   Barriers to discharge        Co-evaluation               End of Session Equipment Utilized During Treatment: Gait belt Activity  Tolerance: Patient tolerated treatment well Patient left: in chair;with call bell/phone within reach           Time: 0917-0935 PT Time Calculation (min) (ACUTE ONLY): 18 min   Charges:   PT Evaluation $Initial PT Evaluation Tier I: 1 Procedure     PT G Codes:        Weston Anna, MPT Pager: [phone]

## 2015-07-18 NOTE — Progress Notes (Signed)
Physical Therapy Treatment Patient Details Name: Tayquan Gassman MRN: [id number] DOB: [birth year] Today's Date: 07/18/2015    History of Present Illness 54 yo male s/p L TH revision 07/17/15. Hx of R TH revision.    PT Comments    Progressing with mobility. Pain ~3/10 during session. Reviewed PWB status while ambulating.   Follow Up Recommendations  Home health PT     Equipment Recommendations  None recommended by PT    Recommendations for Other Services       Precautions / Restrictions Precautions Precautions: None Restrictions Weight Bearing Restrictions: Yes LLE Weight Bearing: Partial weight bearing LLE Partial Weight Bearing Percentage or Pounds: 25-50%    Mobility  Bed Mobility               General bed mobility comments: oob in recliner  Transfers Overall transfer level: Needs assistance Equipment used: Rolling walker (2 wheeled) Transfers: Sit to/from Stand Sit to Stand: Min guard         General transfer comment: close guard for safety. VCs safety, technique, hand placement.  Ambulation/Gait Ambulation/Gait assistance: Min guard Ambulation Distance (Feet): 115 Feet Assistive device: Rolling walker (2 wheeled) Gait Pattern/deviations: Step-to pattern     General Gait Details: VCs for sequence, adherence WB status, and proper use of RW.    Stairs            Wheelchair Mobility    Modified Rankin (Stroke Patients Only)       Balance                                    Cognition Arousal/Alertness: Awake/alert Behavior During Therapy: WFL for tasks assessed/performed Overall Cognitive Status: Within Functional Limits for tasks assessed                      Exercises Total Joint Exercises Ankle Circles/Pumps: AROM;Both;10 reps;Supine Quad Sets: Both;10 reps;Supine Heel Slides: AAROM;Left;10 reps;Supine Hip ABduction/ADduction: Left;10 reps;Supine;AROM Long Arc Quad: AROM;Left;10 reps;Supine    General  Comments        Pertinent Vitals/Pain Pain Assessment: 0-10 Pain Score: 3  Pain Location: L hip Pain Descriptors / Indicators: Sore Pain Intervention(s): Monitored during session;Ice applied;Repositioned    Home Living                      Prior Function            PT Goals (current goals can now be found in the care plan section) Progress towards PT goals: Progressing toward goals    Frequency  7X/week    PT Plan Current plan remains appropriate    Co-evaluation             End of Session   Activity Tolerance: Patient tolerated treatment well Patient left: in chair;with call bell/phone within reach     Time: 1358-1414 PT Time Calculation (min) (ACUTE ONLY): 16 min  Charges:  $Gait Training: 8-22 mins                    G Codes:      Weston Anna, MPT Pager: [phone]

## 2015-07-18 NOTE — Plan of Care (Signed)
Problem: Consults Goal: Diagnosis- Total Joint Replacement Outcome: Completed/Met Date Met:  07/17/15 Revision Total Hip

## 2015-07-18 NOTE — Progress Notes (Signed)
Patient ID: [name], male   DOB: 01-08-1961, 54 y.o.   MRN: [id number] Subjective: 1 Day Post-Op Procedure(s) (LRB): LEFT POSTERIOR  HIP REVISION (Left)    Patient reports pain as mild.  No events overnight  Objective:   VITALS:   Filed Vitals:   07/18/15 0630  BP:   Pulse: 64  Temp:   Resp: 18    Neurovascular intact Incision: dressing C/D/I  LABS  Recent Labs  07/18/15 0450  HGB 11.4*  HCT 33.3*  WBC 9.9  PLT 187     Recent Labs  07/18/15 0450  NA 131*  K 4.3  BUN 9  CREATININE 0.75  GLUCOSE 158*    No results for input(s): LABPT, INR in the last 72 hours.   Assessment/Plan: 1 Day Post-Op Procedure(s) (LRB): LEFT POSTERIOR  HIP REVISION (Left)   Advance diet Up with therapy Plan for discharge tomorrow Discharge home with home health

## 2015-07-18 NOTE — Care Management Note (Addendum)
Case Management Note  Patient Details  Name: [name] MRN: [id number] Date of Birth: 01-Nov-1960  Subjective/Objective:                   LEFT POSTERIOR HIP REVISION (Left)  Action/Plan:  Discharging planning Expected Discharge Date:                  Expected Discharge Plan:  [place]  In-House Referral:     Discharge planning Services  CM Consult  Post Acute Care Choice:    Choice offered to:  NA  DME Arranged:  Shower stool DME Agency:     HH Arranged:  PT [place] Agency:  Other - See comment  Status of Service:  In process, will continue to follow  Medicare Important Message Given:    Date Medicare IM Given:    Medicare IM give by:    Date Additional Medicare IM Given:    Additional Medicare Important Message give by:     If discussed at [place] of Stay Meetings, dates discussed:    Additional Comments: 16:58 CM received callback from [place] who requested I fax facesheet, H&P, orders, OP note and face to face to [phone]. CM faxed requested information. Pam states she will arrange for both shower stool and HHPT and ok to discharge pt home when medically stable.  No other CM needs were communicated.  CM met with pt and he dos not have a contact number with him for workers comp [facility]).  CM called Carrington Clamp with [facility] [phone] who states she will have the pt's WC NCM call me to arrange for HHPT.  Cm waiting for callback. Dellie Catholic, RN 07/18/2015, 2:29 PM

## 2015-07-18 NOTE — Op Note (Signed)
NAMEMarland Riddle  Christopher, GRAMM NO.:  [id number]  MEDICAL RECORD NO.:  [id number]  LOCATION:  64                         FACILITY:  [facility]  PHYSICIAN:  Pietro Cassis. Alvan Dame, M.D.  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  PROCEDURE:  Revision left total hip arthroplasty.  COMPONENTS USED:  We did an off-label utilization of the retained 28-mm with a +8 inner sleeve for the Richard stem identified by old OP notes as opposed to having to perform a trochanteric osteotomy and removing the femoral stem.  I made it this with an Architectural technologist MDM system with a size 56 cup, a 9F polyethylene liner and a F metal liner.  We also used four cancellous screws into the ilium as well as autograft, 15 mL.  SURGEON:  Pietro Cassis. Alvan Dame, M.D.  ASSISTANT:  Danae Orleans, PA-C.  ANESTHESIA:  General.  SPECIMENS:  None.  COMPLICATIONS:  None.  DRAINS:  None.  BLOOD LOSS:  About 350 mL.  INDICATIONS FOR PROCEDURE:  [name] is a 54 year old patient of mine who I followed for sometime now.  His hip replacements were done remotely in the 90s.  We have already revised his right hip and at that time, had done a trochanteric osteotomy, removal of femoral stem and performed acetabular revision.  He has done very well with that procedure over time.  He presented recently with relatively acute onset of left hip pain. There was obvious change in the appearance of his acetabulum, but bone scan confirmation did not indicate uptake in the acetabulum.  However, it was barely evident there was  change in position of the acetabulum. Given the pain that he was having on his radiographic findings consistent with loosening and failed acetabulum, surgery was scheduled for revision acetabulum.  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  He was scheduled for surgery with plan for acetabular revision.  Consent was obtained noting risks of infection, DVT, component failure, and need for future revision surgery.  PROCEDURE IN DETAIL:  The patient was brought to the operative theater. Once adequate anesthesia, preoperative antibiotics, 1500 mg of vancomycin due to allergy to cephalosporin, he was positioned into the right lateral decubitus position with left side up.  The left lower extremity was then prepped and draped in sterile fashion.  Time-out was performed identifying the patient, planned procedure, and extremity.  His old incision was excised.  Soft tissue dissection was carried down to the iliotibial band and gluteal fascia for posterior approach to the hip.  These were then incised for posterior approach.  Following initial exposure of the posterior aspect of the hip, exposure of the acetabulum, required  capsulectomy of remaining pseudocapsule identifying significant osteolytic, synovitic changes inside the acetabulum.  Once the hip was exposed, posterior two-third of the hip was dislocated, and the femoral head was removed and saved.  We recognized that this was a Richard stem and the 28-mm head that was present, would fit into the MDM cup liner system from [employer].  This was cleaned and washed, and held on the back table.  At this point, we identified that the acetabulum was completely loose and was removed using a rongeur.  Behind this, the acetabulum had significant osteolytic damage with significant osteolytic  fibrous tissue as well as the defect centrally and very shallowed out anterior and posterior walls.  Following debridement with a large curette, I was able to identify the remaining anatomy including the anterior and posterior columns and superior ilium albeit shallow.  I reamed with a 55 reamer initially and then a 57 reamer, and we felt that we had adequate bone stalk within the acetabulum, but did not want to ream any further bone.  At this point, I opened 15 mL of cancellous bone chips and packed this into the acetabulum and reversed the ream with a 56 reamer to fill the voids to the ischium posteriorly and the ilium anteriorly.  We then selected a 58-mm Trident hemispherical shell, multi-hole from [place] and impacted it.  The initial scratch fit was pretty weak.  Cup position did indicate that the cup was forward flexed about 20 degrees and abducted about 40 degrees.  At this point, I was able to place four cancellous screws; three into the ilium with excellent purchase into the iliac bone, which was appeared to be sclerotic and then placed one in the inferior-anterior aspect into the area of the pubic and rami.  Given these findings, I went ahead and selected the F liner to match the 58 shell.  It was impacted down with good visualized rim fit.  Following this, we opened up the 46 F polyethylene head ball for the MDM shell to match the F liner.  On the back table, a 28 head with an 8-mm inner- sleeve was then compressed into the MDM ball with normal popping sensation that goes along with the system.  Following this, the two were impacted onto clean and dry trunnion, and the hip was reduced.  The hip was stable, leg lengths appeared equal.  The hip was irrigated throughout the case and again at this point. Given the capsulectomy was performed, no posterior repair was carried out; instead the iliotibial band and gluteal fascia were reapproximated using #1 Vicryl and 0  V-Loc suture.  The remainder of the wound was closed with 2-0 Vicryl and running 3-0 Monocryl.  The hip was then cleaned, dried and dressed sterilely using surgical glue and an Aquacel dressing.  He was then brought to the recovery room in stable condition tolerating the procedure well.  Based on the findings intraoperatively including the bone stalk available, I will make him 25-50% weightbearing on the left lower extremity at least for the next 6-8 weeks to make certain we have bone union on the acetabular shell.  This would be imperative for longevity for the remainder of his components.  Mr. Cina is only 54 years of age.  The hope is by exchanging his acetabulum and eliminate the osteolytic source from this old polyethylene that we can provide him with a hip that would last another 20+ years.  Findings were reviewed with family.     Rodman Key  Marian Sorrow, M.D.     MDO/MEDQ  D:  07/17/2015  T:  07/18/2015  Job:  [id number]

## 2015-07-19 DIAGNOSIS — E669 Obesity, unspecified: Secondary | ICD-10-CM | POA: Diagnosis present

## 2015-07-19 LAB — CBC
HCT: 31.1 % — ABNORMAL LOW (ref 39.0–52.0)
HEMOGLOBIN: 10.9 g/dL — AB (ref 13.0–17.0)
MCH: 34.3 pg — AB (ref 26.0–34.0)
MCHC: 35 g/dL (ref 30.0–36.0)
MCV: 97.8 fL (ref 78.0–100.0)
Platelets: 192 10*3/uL (ref 150–400)
RBC: 3.18 MIL/uL — ABNORMAL LOW (ref 4.22–5.81)
RDW: 13.3 % (ref 11.5–15.5)
WBC: 9.7 10*3/uL (ref 4.0–10.5)

## 2015-07-19 LAB — BASIC METABOLIC PANEL
Anion gap: 5 (ref 5–15)
BUN: 14 mg/dL (ref 6–20)
CHLORIDE: 101 mmol/L (ref 101–111)
CO2: 27 mmol/L (ref 22–32)
CREATININE: 0.9 mg/dL (ref 0.61–1.24)
Calcium: 9.2 mg/dL (ref 8.9–10.3)
GFR calc Af Amer: >60 mL/min (ref >60)
GFR calc non Af Amer: >60 mL/min (ref >60)
GLUCOSE: 160 mg/dL — AB (ref 65–99)
Potassium: 4.4 mmol/L (ref 3.5–5.1)
SODIUM: 133 mmol/L — AB (ref 135–145)

## 2015-07-19 MED ORDER — POLYETHYLENE GLYCOL 3350 17 G PO PACK: 17.0000 g | PACK | Freq: Two times a day (BID) | ORAL | Status: DC

## 2015-07-19 MED ORDER — METHOCARBAMOL 500 MG PO TABS: 500.0000 mg | ORAL_TABLET | Freq: Four times a day (QID) | ORAL | Status: DC | PRN

## 2015-07-19 MED ORDER — DOCUSATE SODIUM 100 MG PO CAPS: 100.0000 mg | ORAL_CAPSULE | Freq: Two times a day (BID) | ORAL | Status: DC

## 2015-07-19 MED ORDER — FERROUS SULFATE 325 (65 FE) MG PO TABS: 325.0000 mg | ORAL_TABLET | Freq: Three times a day (TID) | ORAL | Status: DC

## 2015-07-19 MED ORDER — HYDROCODONE-ACETAMINOPHEN 7.5-325 MG PO TABS: 1.0000 | ORAL_TABLET | ORAL | Status: DC | PRN

## 2015-07-19 MED ORDER — ASPIRIN 325 MG PO TBEC: 325.0000 mg | DELAYED_RELEASE_TABLET | Freq: Two times a day (BID) | ORAL | Status: AC

## 2015-07-19 NOTE — Progress Notes (Signed)
RN reviewed discharge instructions with patient and family. All questions answered.  Paperwork and prescriptions given.   NT rolled patient down in wheelchair to family car.  

## 2015-07-19 NOTE — Progress Notes (Signed)
     Subjective: 2 Days Post-Op Procedure(s) (LRB): LEFT POSTERIOR  HIP REVISION (Left)   Patient reports pain as mild, pain controlled. No events throughout the night.  Discussed restrictions and the dressing.   Ready to be discharged home.   Objective:   VITALS:   Filed Vitals:   07/18/15 2200  BP: 143/76  Pulse: 60  Temp: 98.1 F (36.7 C)  Resp: 18    Dorsiflexion/Plantar flexion intact Incision: dressing C/D/I No cellulitis present Compartment soft  LABS  Recent Labs  07/18/15 0450 07/19/15 0506  HGB 11.4* 10.9*  HCT 33.3* 31.1*  WBC 9.9 9.7  PLT 187 192     Recent Labs  07/18/15 0450 07/19/15 0506  NA 131* 133*  K 4.3 4.4  BUN 9 14  CREATININE 0.75 0.90  GLUCOSE 158* 160*     Assessment/Plan: 2 Days Post-Op Procedure(s) (LRB): LEFT POSTERIOR  HIP REVISION (Left) Maintain 25-50% WB left leg Up with therapy Discharge home with home health  Follow up in 2 weeks at [facility]. Follow up with OLIN,Rolonda Pontarelli D in 2 weeks.  Contact information:  [facility] [address] [place] [phone]    Obese (BMI 30-39.9) Estimated body mass index is 35.39 kg/(m^2) as calculated from the following:   Height as of this encounter: 5\' 7"  (1.702 m).   Weight as of this encounter: 102.513 kg (226 lb). Patient also counseled that weight may inhibit the healing process Patient counseled that losing weight will help with future health issues         West Pugh. Athina Fahey   PAC  07/19/2015, 8:07 AM

## 2015-07-19 NOTE — Discharge Instructions (Signed)
INSTRUCTIONS AFTER JOINT REPLACEMENT   o Remove items at home which could result in a fall. This includes throw rugs or furniture in walking pathways o ICE to the affected joint every three hours while awake for 30 minutes at a time, for at least the first 3-5 days, and then as needed for pain and swelling.  Continue to use ice for pain and swelling. You may notice swelling that will progress down to the foot and ankle.  This is normal after surgery.  Elevate your leg when you are not up walking on it.   o Continue to use the breathing machine you got in the hospital (incentive spirometer) which will help keep your temperature down.  It is common for your temperature to cycle up and down following surgery, especially at night when you are not up moving around and exerting yourself.  The breathing machine keeps your lungs expanded and your temperature down.   DIET:  As you were doing prior to hospitalization, we recommend a well-balanced diet.  DRESSING / WOUND CARE / SHOWERING  Keep the surgical dressing until follow up.  The dressing is water proof, so you can shower without any extra covering.  IF THE DRESSING FALLS OFF or the wound gets wet inside, change the dressing with sterile gauze.  Please use good hand washing techniques before changing the dressing.  Do not use any lotions or creams on the incision until instructed by your surgeon.    ACTIVITY  o Increase activity slowly as tolerated, but follow the weight bearing instructions below.   o No driving for 6 weeks or until further direction given by your physician.  You cannot drive while taking narcotics.  o No lifting or carrying greater than 10 lbs. until further directed by your surgeon. o Avoid periods of inactivity such as sitting longer than an hour when not asleep. This helps prevent blood clots.  o You may return to work once you are authorized by your doctor.     WEIGHT BEARING   Partial weight bearing with assist device as  directed.  25-50%   EXERCISES  Results after joint replacement surgery are often greatly improved when you follow the exercise, range of motion and muscle strengthening exercises prescribed by your doctor. Safety measures are also important to protect the joint from further injury. Any time any of these exercises cause you to have increased pain or swelling, decrease what you are doing until you are comfortable again and then slowly increase them. If you have problems or questions, call your caregiver or physical therapist for advice.   Rehabilitation is important following a joint replacement. After just a few days of immobilization, the muscles of the leg can become weakened and shrink (atrophy).  These exercises are designed to build up the tone and strength of the thigh and leg muscles and to improve motion. Often times heat used for twenty to thirty minutes before working out will loosen up your tissues and help with improving the range of motion but do not use heat for the first two weeks following surgery (sometimes heat can increase post-operative swelling).   These exercises can be done on a training (exercise) mat, on the floor, on a table or on a bed. Use whatever works the best and is most comfortable for you.    Use music or television while you are exercising so that the exercises are a pleasant break in your day. This will make your life better with the exercises  acting as a break in your routine that you can look forward to.   Perform all exercises about fifteen times, three times per day or as directed.  You should exercise both the operative leg and the other leg as well.  Exercises include:    Quad Sets - Tighten up the muscle on the front of the thigh (Quad) and hold for 5-10 seconds.    Straight Leg Raises - With your knee straight (if you were given a brace, keep it on), lift the leg to 60 degrees, hold for 3 seconds, and slowly lower the leg.  Perform this exercise against  resistance later as your leg gets stronger.   Leg Slides: Lying on your back, slowly slide your foot toward your buttocks, bending your knee up off the floor (only go as far as is comfortable). Then slowly slide your foot back down until your leg is flat on the floor again.   Angel Wings: Lying on your back spread your legs to the side as far apart as you can without causing discomfort.   Hamstring Strength:  Lying on your back, push your heel against the floor with your leg straight by tightening up the muscles of your buttocks.  Repeat, but this time bend your knee to a comfortable angle, and push your heel against the floor.  You may put a pillow under the heel to make it more comfortable if necessary.   A rehabilitation program following joint replacement surgery can speed recovery and prevent re-injury in the future due to weakened muscles. Contact your doctor or a physical therapist for more information on knee rehabilitation.    CONSTIPATION  Constipation is defined medically as fewer than three stools per week and severe constipation as less than one stool per week.  Even if you have a regular bowel pattern at home, your normal regimen is likely to be disrupted due to multiple reasons following surgery.  Combination of anesthesia, postoperative narcotics, change in appetite and fluid intake all can affect your bowels.   YOU MUST use at least one of the following options; they are listed in order of increasing strength to get the job done.  They are all available over the counter, and you may need to use some, POSSIBLY even all of these options:    Drink plenty of fluids (prune juice may be helpful) and high fiber foods Colace 100 mg by mouth twice a day  Senokot for constipation as directed and as needed Dulcolax (bisacodyl), take with full glass of water  Miralax (polyethylene glycol) once or twice a day as needed.  If you have tried all these things and are unable to have a bowel  movement in the first 3-4 days after surgery call either your surgeon or your primary doctor.    If you experience loose stools or diarrhea, hold the medications until you stool forms back up.  If your symptoms do not get better within 1 week or if they get worse, check with your doctor.  If you experience "the worst abdominal pain ever" or develop nausea or vomiting, please contact the office immediately for further recommendations for treatment.   ITCHING:  If you experience itching with your medications, try taking only a single pain pill, or even half a pain pill at a time.  You can also use Benadryl over the counter for itching or also to help with sleep.   TED HOSE STOCKINGS:  Use stockings on both legs until for at least  2 weeks or as directed by physician office. They may be removed at night for sleeping.  MEDICATIONS:  See your medication summary on the After Visit Summary that nursing will review with you.  You may have some home medications which will be placed on hold until you complete the course of blood thinner medication.  It is important for you to complete the blood thinner medication as prescribed.  PRECAUTIONS:  If you experience chest pain or shortness of breath - call 911 immediately for transfer to the hospital emergency department.   If you develop a fever greater that 101 F, purulent drainage from wound, increased redness or drainage from wound, foul odor from the wound/dressing, or calf pain - CONTACT YOUR SURGEON.                                                   FOLLOW-UP APPOINTMENTS:  If you do not already have a post-op appointment, please call the office for an appointment to be seen by your surgeon.  Guidelines for how soon to be seen are listed in your After Visit Summary, but are typically between 1-4 weeks after surgery.  OTHER INSTRUCTIONS:   Knee Replacement:  Do not place pillow under knee, focus on keeping the knee straight while resting.   MAKE SURE  YOU:   Understand these instructions.   Get help right away if you are not doing well or get worse.    Thank you for letting us be a part of your medical care team.  It is a privilege we respect greatly.  We hope these instructions will help you stay on track for a fast and full recovery!

## 2015-07-19 NOTE — Evaluation (Signed)
Occupational Therapy Evaluation Patient Details Name: [name] MRN: [id number] DOB: 12/11/1960 Today's Date: 07/19/2015    History of Present Illness 54 yo male s/p L TH revision 07/17/15. Hx of R TH revision.   Clinical Impression   Patient admitted with above. Patient independent PTA. Patient currently functioning at an overall mod I level. D/C from acute OT services and no additional follow-up OT needs at this time. All appropriate education provided to patient. Please re-order OT if needed.      Follow Up Recommendations  No OT follow up;Supervision - Intermittent    Equipment Recommendations  Tub/shower bench    Recommendations for Other Services  None at this time  Precautions / Restrictions Precautions Precautions: Posterior Hip;Fall Restrictions Weight Bearing Restrictions: Yes LLE Weight Bearing: Partial weight bearing LLE Partial Weight Bearing Percentage or Pounds: 25-50%    Mobility Bed Mobility Overal bed mobility: Modified Independent  Transfers Overall transfer level: Modified independent Equipment used: Rolling walker (2 wheeled) Transfers: Sit to/from [facility] Sit to Stand: Modified independent (Device/Increase time) Stand pivot transfers: Modified independent (Device/Increase time) General transfer comment: supervision for safety.     Balance Overall balance assessment: Needs assistance Sitting-balance support: No upper extremity supported;Feet supported Sitting balance-Leahy Scale: Good     Standing balance support: Bilateral upper extremity supported;During functional activity Standing balance-Leahy Scale: Fair    ADL Overall ADL's : At baseline;Modified independent General ADL Comments: Pt overall supervision to mod I with functional mobility using RW and ADLs. Pt uses AE (reacher, sock aid, LH sponge, LH shoe horn) for LB ADLs. Pt able to state posterior hip precautions independently and 25-50% WB restriction > LLE.      Pertinent Vitals/Pain Pain Assessment: Faces Pain Score: 3  Faces Pain Scale: Hurts a little bit Pain Location: left hip Pain Descriptors / Indicators: Sore Pain Intervention(s): Monitored during session;Repositioned     Hand Dominance Right   Extremity/Trunk Assessment Upper Extremity Assessment Upper Extremity Assessment: Overall WFL for tasks assessed   Lower Extremity Assessment Lower Extremity Assessment: Defer to PT evaluation   Cervical / Trunk Assessment Cervical / Trunk Assessment: Normal   Communication Communication Communication: No difficulties   Cognition Arousal/Alertness: Awake/alert Behavior During Therapy: WFL for tasks assessed/performed Overall Cognitive Status: Within Functional Limits for tasks assessed              Home Living Family/patient expects to be discharged to:: Private residence Living Arrangements: Spouse/significant other Available Help at Discharge: Family Type of Home: House Home Access: Stairs to enter Technical brewer of Steps: 1   Home Layout: Able to live on main level with bedroom/bathroom;Two level     Bathroom Shower/Tub: Teacher, early years/pre: Handicapped height     Home Equipment: Environmental consultant - 2 wheels;Adaptive equipment;Crutches;Bedside commode Adaptive Equipment: Sock aid;Reacher;Long-handled shoe horn   Prior Functioning/Environment Level of Independence: Independent     OT Diagnosis: Generalized weakness;Acute pain   OT Problem List:  n/a, no acute OT needs identified    OT Treatment/Interventions:   n/a, no acute OT needs identified    OT Goals(Current goals can be found in the care plan section) Acute Rehab OT Goals Patient Stated Goal: go home today OT Goal Formulation: All assessment and education complete, DC therapy  OT Frequency:   n/a, no acute OT needs identified    Barriers to D/C:  None known at this time    End of Session Equipment Utilized During Treatment: Rolling  walker  Activity Tolerance:  Patient tolerated treatment well Patient left: in chair;with call bell/phone within reach   Time: 0930-0942 OT Time Calculation (min): 12 min Charges:  OT General Charges $OT Visit: 1 Procedure OT Evaluation $Initial OT Evaluation Tier I: 1 Procedure  Charlei Ramsaran , MS, OTR/L, CLT Pager: [phone]  07/19/2015, 12:04 PM

## 2015-07-19 NOTE — Progress Notes (Signed)
Physical Therapy Treatment Patient Details Name: [name] MRN: [id number] DOB: Nov 22, 1960 Today's Date: 07/19/2015    History of Present Illness 54 yo male s/p L TH revision 07/17/15. Hx of R TH revision.    PT Comments    Progressing very well with mobility. All education completed. Ready to d/c from PT standpoint.   Follow Up Recommendations  Home health PT     Equipment Recommendations  None recommended by PT    Recommendations for Other Services       Precautions / Restrictions Precautions Precautions: None Restrictions Weight Bearing Restrictions: Yes LLE Weight Bearing: Partial weight bearing LLE Partial Weight Bearing Percentage or Pounds: 25-50%    Mobility  Bed Mobility               General bed mobility comments: oob in recliner  Transfers Overall transfer level: Needs assistance Equipment used: Rolling walker (2 wheeled) Transfers: Sit to/from Stand Sit to Stand: Supervision         General transfer comment: supervision for safety.   Ambulation/Gait Ambulation/Gait assistance: Supervision Ambulation Distance (Feet): 150 Feet Assistive device: Rolling walker (2 wheeled) Gait Pattern/deviations: Step-to pattern     General Gait Details: supervision for safety   Stairs Stairs: Yes Stairs assistance: Supervision Stair Management: Backwards;Step to pattern Number of Stairs: 1 General stair comments: VCs safety, technique, sequence. supervision for safety  Wheelchair Mobility    Modified Rankin (Stroke Patients Only)       Balance                                    Cognition Arousal/Alertness: Awake/alert Behavior During Therapy: WFL for tasks assessed/performed Overall Cognitive Status: Within Functional Limits for tasks assessed                      Exercises Total Joint Exercises Ankle Circles/Pumps: AROM;Both;10 reps;Supine Quad Sets: Both;10 reps;Supine Heel Slides: Left;10  reps;Supine;AROM Hip ABduction/ADduction: Left;10 reps;Supine;AROM Long Arc Quad: AROM;Left;10 reps;Supine    General Comments        Pertinent Vitals/Pain Pain Assessment: 0-10 Pain Score: 3  Pain Location: L hip Pain Descriptors / Indicators: Sore Pain Intervention(s): Monitored during session;Ice applied;Repositioned    Home Living                      Prior Function            PT Goals (current goals can now be found in the care plan section) Progress towards PT goals: Progressing toward goals    Frequency  7X/week    PT Plan Current plan remains appropriate    Co-evaluation             End of Session Equipment Utilized During Treatment: Gait belt Activity Tolerance: Patient tolerated treatment well Patient left: in chair;with call bell/phone within reach     Time: 4128-7867 PT Time Calculation (min) (ACUTE ONLY): 15 min  Charges:  $Gait Training: 8-22 mins                    G Codes:      Weston Anna, MPT Pager: [phone]

## 2015-07-19 NOTE — Progress Notes (Signed)
CM received call from [facility] requesting a tub transfer bench order.  CM requested order.  CM faxed order to [facility] and received confirmation of receipt.  No other CM needs were communicated.

## 2015-07-20 NOTE — Discharge Summary (Signed)
Physician Discharge Summary  Patient ID: [name] MRN: [id number] DOB/AGE: 1961/10/12 54 y.o.  Admit date: 07/17/2015 Discharge date: 07/19/2015   Procedures:  Procedure(s) (LRB): LEFT POSTERIOR  HIP REVISION (Left)  Attending Physician:  Dr. Paralee Cancel   Admission Diagnoses:   Failed left THA  Discharge Diagnoses:  Active Problems:   S/P revision of total hip   Obese  Past Medical History  Diagnosis Date  . PONV (postoperative nausea and vomiting)   . Hypertension   . Pneumonia     hx of    HPI:    [name], 54 y.o. 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 The indications for the revision total hip arthroplasty are fracture or mechanical failure of one or more component. Onset of symptoms was gradual starting >10 years ago with gradually worsening course since that time. Prior procedures on the left hip include arthroplasty. Patient currently rates pain in the left hip at 9 out of 10 with activity. There is night pain, worsening of pain with activity and weight bearing, trendelenberg gait, pain that interfers with activities of daily living and pain with passive range of motion. Patient has evidence of malalignment by imaging studies. This condition presents safety issues increasing the risk of falls.  There is no current active infection. Risks, benefits and expectations were discussed with the patient. Risks including but not limited to the risk of anesthesia, blood clots, nerve damage, blood vessel damage, failure of the prosthesis, infection and up to and including death. Patient understand the risks, benefits and expectations and wishes to proceed with surgery.   PCP: No primary care provider on file.   Discharged Condition:  good  Hospital Course:  Patient underwent the above stated procedure on 07/17/2015. Patient tolerated the procedure well and brought to the recovery room in good condition and subsequently to the floor.  POD #1 BP: 143/62 ; Pulse: 65 ; Temp: 98.6 F (37 C) ; Resp: 18 Patient reports pain as mild. No events overnight. Neurovascular intact and incision: dressing C/D/I  LABS  Basename    HGB  11.4  HCT  33.3   POD #2  BP: 143/76 ; Pulse: 60 ; Temp: 98.1 F (36.7 C) ; Resp: 18 Patient reports pain as mild, pain controlled. No events throughout the night. Discussed restrictions and the dressing. Ready to be discharged home.  Neurovascular intact and incision: dressing C/D/I  LABS  Basename    HGB  10.9  HCT  31.1    Discharge Exam: General appearance: alert, cooperative and no distress Extremities: Homans sign is negative, no sign of DVT, no edema, redness or tenderness in the calves or thighs and no ulcers, gangrene or trophic changes  Disposition: Home with follow up in 2 weeks   Follow-up Information    Follow up with [name], MD. Schedule an appointment as soon as possible for a visit in 2 weeks.   Specialty:  Orthopedic Surgery   Contact information:   [address] [place] [address] [phone]       Discharge Instructions    Call MD / Call 911    Complete by:  As directed   If you experience chest pain or shortness of breath, CALL 911 and be transported to the hospital emergency room.  If you develope a fever above 101 F, pus (white drainage) or increased drainage or redness at the wound, or calf pain, call your surgeon's office.     Change dressing    Complete by:  As directed   Maintain surgical dressing until follow up in the clinic. If the edges start to pull up, may reinforce with tape. If the dressing is no longer working, may remove and cover with gauze and tape, but must keep the area dry and clean.  Call with any questions or  concerns.     Constipation Prevention    Complete by:  As directed   Drink plenty of fluids.  Prune juice may be helpful.  You may use a stool softener, such as Colace (over the counter) 100 mg twice a day.  Use MiraLax (over the counter) for constipation as needed.     Diet - low sodium heart healthy    Complete by:  As directed      Discharge instructions    Complete by:  As directed   Maintain surgical dressing until follow up in the clinic. If the edges start to pull up, may reinforce with tape. If the dressing is no longer working, may remove and cover with gauze and tape, but must keep the area dry and clean.  Follow up in 2 weeks at [facility]. Call with any questions or concerns.     Partial weight bearing    Complete by:  As directed   % Body Weight:  25-50  Laterality:  left  Extremity:  Lower     TED hose    Complete by:  As directed   Use stockings (TED hose) for 2 weeks on both leg(s).  You may remove them at night for sleeping.             Medication List    STOP taking these medications        ALEVE 220 MG Caps  Generic drug:  Naproxen Sodium     HYDROcodone-acetaminophen 5-325 MG tablet  Commonly known as:  NORCO/VICODIN  Replaced by:  HYDROcodone-acetaminophen 7.5-325 MG tablet      TAKE these medications        aspirin 325 MG EC tablet  Take 1 tablet (325 mg total) by mouth 2 (two) times daily.     carvedilol 12.5 MG tablet  Commonly known as:  COREG  Take 12.5 mg by mouth 2 (two) times daily.     docusate sodium 100 MG capsule  Commonly known as:  COLACE  Take 1 capsule (100 mg total) by mouth 2 (two) times daily.     ferrous sulfate 325 (65 FE) MG tablet  Take 1 tablet (325 mg total) by mouth 3 (three) times daily after meals.     HYDROcodone-acetaminophen 7.5-325 MG tablet  Commonly known as:  NORCO  Take 1-2 tablets by mouth every 4 (four) hours as needed for moderate pain.     methocarbamol 500 MG tablet  Commonly known as:   ROBAXIN  Take 1 tablet (500 mg total) by mouth every 6 (six) hours as needed for muscle spasms.     multivitamin with minerals Tabs tablet  Take 1 tablet by mouth daily.     polyethylene glycol packet  Commonly known as:  MIRALAX / GLYCOLAX  Take 17 g by mouth 2 (two) times daily.     valsartan 320 MG tablet  Commonly known as:  DIOVAN  Take 320 mg by mouth daily.  Signed: West Pugh. Delva Derden   PA-C  07/20/2015, 11:27 AM

## 2016-05-14 NOTE — Patient Instructions (Addendum)
Rennick Awadallah  05/14/2016   Your procedure is scheduled on: 05/27/2016    Report to [facility] Main  Entrance take [place]  elevators to 3rd floor to  [place] at  0830 AM.  Call this number if you have problems the morning of surgery [phone]   Remember: ONLY 1 PERSON MAY GO WITH YOU TO SHORT STAY TO GET  READY MORNING OF [place].  Do not eat food or drink liquids :After Midnight.     Take these medicines the morning of surgery with A SIP OF WATER: Carvedilol ( coreg), Norvasc                                You may not have any metal on your body including hair pins and              piercings  Do not wear jewelry, , lotions, powders or perfumes, deodorant                          Men may shave face and neck.   Do not bring valuables to the hospital. [place].  Contacts, dentures or bridgework may not be worn into surgery.  Leave suitcase in the car. After surgery it may be brought to your room.         Special Instructions: coughing and deep breathing exercises, leg exercises               Please read over the following fact sheets you were given: _____________________________________________________________________             [facility] - Preparing for Surgery Before surgery, you can play an important role.  Because skin is not sterile, your skin needs to be as free of germs as possible.  You can reduce the number of germs on your skin by washing with CHG (chlorahexidine gluconate) soap before surgery.  CHG is an antiseptic cleaner which kills germs and bonds with the skin to continue killing germs even after washing. Please DO NOT use if you have an allergy to CHG or antibacterial soaps.  If your skin becomes reddened/irritated stop using the CHG and inform your nurse when you arrive at Short Stay. Do not shave (including legs and underarms) for at least 48 hours prior to the first CHG  shower.  You may shave your face/neck. Please follow these instructions carefully:  1.  Shower with CHG Soap the night before surgery and the  morning of Surgery.  2.  If you choose to wash your hair, wash your hair first as usual with your  normal  shampoo.  3.  After you shampoo, rinse your hair and body thoroughly to remove the  shampoo.                           4.  Use CHG as you would any other liquid soap.  You can apply chg directly  to the skin and wash                       Gently with a scrungie or clean washcloth.  5.  Apply the CHG Soap to your body ONLY FROM THE NECK DOWN.   Do not use on face/ open                           Wound or open sores. Avoid contact with eyes, ears mouth and genitals (private parts).                       Wash face,  Genitals (private parts) with your normal soap.             6.  Wash thoroughly, paying special attention to the area where your surgery  will be performed.  7.  Thoroughly rinse your body with warm water from the neck down.  8.  DO NOT shower/wash with your normal soap after using and rinsing off  the CHG Soap.                9.  Pat yourself dry with a clean towel.            10.  Wear clean pajamas.            11.  Place clean sheets on your bed the night of your first shower and do not  sleep with pets. Day of Surgery : Do not apply any lotions/deodorants the morning of surgery.  Please wear clean clothes to the hospital/surgery center.  FAILURE TO FOLLOW THESE INSTRUCTIONS MAY RESULT IN THE CANCELLATION OF YOUR SURGERY PATIENT SIGNATURE_________________________________  NURSE SIGNATURE__________________________________  ________________________________________________________________________  WHAT IS A BLOOD TRANSFUSION? Blood Transfusion Information  A transfusion is the replacement of blood or some of its parts. Blood is made up of multiple cells which provide different functions.  Red blood cells carry oxygen and are used for  blood loss replacement.  White blood cells fight against infection.  Platelets control bleeding.  Plasma helps clot blood.  Other blood products are available for specialized needs, such as hemophilia or other clotting disorders. BEFORE THE TRANSFUSION  Who gives blood for transfusions?   Healthy volunteers who are fully evaluated to make sure their blood is safe. This is blood bank blood. Transfusion therapy is the safest it has ever been in the practice of medicine. Before blood is taken from a donor, a complete history is taken to make sure that person has no history of diseases nor engages in risky social behavior (examples are intravenous drug use or sexual activity with multiple partners). The donor's travel history is screened to minimize risk of transmitting infections, such as malaria. The donated blood is tested for signs of infectious diseases, such as HIV and hepatitis. The blood is then tested to be sure it is compatible with you in order to minimize the chance of a transfusion reaction. If you or a relative donates blood, this is often done in anticipation of surgery and is not appropriate for emergency situations. It takes many days to process the donated blood. RISKS AND COMPLICATIONS Although transfusion therapy is very safe and saves many lives, the main dangers of transfusion include:   Getting an infectious disease.  Developing a transfusion reaction. This is an allergic reaction to something in the blood you were given. Every precaution is taken to prevent this. The decision to have a blood transfusion has been considered carefully by your caregiver before blood is given. Blood is not given unless the benefits outweigh the risks. AFTER THE TRANSFUSION  Right after receiving a  blood transfusion, you will usually feel much better and more energetic. This is especially true if your red blood cells have gotten low (anemic). The transfusion raises the level of the red blood  cells which carry oxygen, and this usually causes an energy increase.  The nurse administering the transfusion will monitor you carefully for complications. HOME CARE INSTRUCTIONS  No special instructions are needed after a transfusion. You may find your energy is better. Speak with your caregiver about any limitations on activity for underlying diseases you may have. SEEK MEDICAL CARE IF:   Your condition is not improving after your transfusion.  You develop redness or irritation at the intravenous (IV) site. SEEK IMMEDIATE MEDICAL CARE IF:  Any of the following symptoms occur over the next 12 hours:  Shaking chills.  You have a temperature by mouth above 102 F (38.9 C), not controlled by medicine.  Chest, back, or muscle pain.  People around you feel you are not acting correctly or are confused.  Shortness of breath or difficulty breathing.  Dizziness and fainting.  You get a rash or develop hives.  You have a decrease in urine output.  Your urine turns a dark color or changes to pink, red, or brown. Any of the following symptoms occur over the next 10 days:  You have a temperature by mouth above 102 F (38.9 C), not controlled by medicine.  Shortness of breath.  Weakness after normal activity.  The white part of the eye turns yellow (jaundice).  You have a decrease in the amount of urine or are urinating less often.  Your urine turns a dark color or changes to pink, red, or brown. Document Released: 09/27/2000 Document Revised: 12/23/2011 Document Reviewed: 05/16/2008 ExitCare Patient Information 2014 [place].  _______________________________________________________________________  Incentive Spirometer  An incentive spirometer is a tool that can help keep your lungs clear and active. This tool measures how well you are filling your lungs with each breath. Taking long deep breaths may help reverse or decrease the chance of developing breathing  (pulmonary) problems (especially infection) following:  A long period of time when you are unable to move or be active. BEFORE THE PROCEDURE   If the spirometer includes an indicator to show your best effort, your nurse or respiratory therapist will set it to a desired goal.  If possible, sit up straight or lean slightly forward. Try not to slouch.  Hold the incentive spirometer in an upright position. INSTRUCTIONS FOR USE  1. Sit on the edge of your bed if possible, or sit up as far as you can in bed or on a chair. 2. Hold the incentive spirometer in an upright position. 3. Breathe out normally. 4. Place the mouthpiece in your mouth and seal your lips tightly around it. 5. Breathe in slowly and as deeply as possible, raising the piston or the ball toward the top of the column. 6. Hold your breath for 3-5 seconds or for as long as possible. Allow the piston or ball to fall to the bottom of the column. 7. Remove the mouthpiece from your mouth and breathe out normally. 8. Rest for a few seconds and repeat Steps 1 through 7 at least 10 times every 1-2 hours when you are awake. Take your time and take a few normal breaths between deep breaths. 9. The spirometer may include an indicator to show your best effort. Use the indicator as a goal to work toward during each repetition. 10. After each set of 10  deep breaths, practice coughing to be sure your lungs are clear. If you have an incision (the cut made at the time of surgery), support your incision when coughing by placing a pillow or rolled up towels firmly against it. Once you are able to get out of bed, walk around indoors and cough well. You may stop using the incentive spirometer when instructed by your caregiver.  RISKS AND COMPLICATIONS  Take your time so you do not get dizzy or light-headed.  If you are in pain, you may need to take or ask for pain medication before doing incentive spirometry. It is harder to take a deep breath if you  are having pain. AFTER USE  Rest and breathe slowly and easily.  It can be helpful to keep track of a log of your progress. Your caregiver can provide you with a simple table to help with this. If you are using the spirometer at home, follow these instructions: [place] IF:   You are having difficultly using the spirometer.  You have trouble using the spirometer as often as instructed.  Your pain medication is not giving enough relief while using the spirometer.  You develop fever of 100.5 F (38.1 C) or higher. SEEK IMMEDIATE MEDICAL CARE IF:   You cough up bloody sputum that had not been present before.  You develop fever of 102 F (38.9 C) or greater.  You develop worsening pain at or near the incision site. MAKE SURE YOU:   Understand these instructions.  Will watch your condition.  Will get help right away if you are not doing well or get worse. Document Released: 02/10/2007 Document Revised: 12/23/2011 Document Reviewed: 04/13/2007 [facility] Patient Information 2014 [place], [place].   ________________________________________________________________________

## 2016-05-16 ENCOUNTER — Encounter (HOSPITAL_COMMUNITY)
Admission: RE | Admit: 2016-05-16 | Discharge: 2016-05-16 | Disposition: A | Discharge status: Home / Self Care | Payer: Worker's Compensation | Source: Ambulatory Visit | Attending: Orthopedic Surgery | Admitting: Orthopedic Surgery

## 2016-05-16 ENCOUNTER — Encounter (HOSPITAL_COMMUNITY): Payer: Self-pay

## 2016-05-16 DIAGNOSIS — T84090A Other mechanical complication of internal right hip prosthesis, initial encounter: Secondary | ICD-10-CM | POA: Insufficient documentation

## 2016-05-16 DIAGNOSIS — Z01812 Encounter for preprocedural laboratory examination: Secondary | ICD-10-CM | POA: Insufficient documentation

## 2016-05-16 DIAGNOSIS — Z0183 Encounter for blood typing: Secondary | ICD-10-CM | POA: Diagnosis not present

## 2016-05-16 DIAGNOSIS — Y838 Other surgical procedures as the cause of abnormal reaction of the patient, or of later complication, without mention of misadventure at the time of the procedure: Secondary | ICD-10-CM | POA: Diagnosis not present

## 2016-05-16 HISTORY — DX: Personal history of other medical treatment: Z92.89

## 2016-05-16 HISTORY — DX: Unspecified osteoarthritis, unspecified site: M19.90

## 2016-05-16 LAB — BASIC METABOLIC PANEL
Anion gap: 10 (ref 5–15)
BUN: 17 mg/dL (ref 6–20)
CHLORIDE: 96 mmol/L — AB (ref 101–111)
CO2: 23 mmol/L (ref 22–32)
CREATININE: 0.87 mg/dL (ref 0.61–1.24)
Calcium: 9.7 mg/dL (ref 8.9–10.3)
GFR calc Af Amer: >60 mL/min (ref >60)
GFR calc non Af Amer: >60 mL/min (ref >60)
Glucose, Bld: 119 mg/dL — ABNORMAL HIGH (ref 65–99)
Potassium: 4.7 mmol/L (ref 3.5–5.1)
SODIUM: 129 mmol/L — AB (ref 135–145)

## 2016-05-16 LAB — CBC
HCT: 39.2 % (ref 39.0–52.0)
Hemoglobin: 14 g/dL (ref 13.0–17.0)
MCH: 33.7 pg (ref 26.0–34.0)
MCHC: 35.7 g/dL (ref 30.0–36.0)
MCV: 94.2 fL (ref 78.0–100.0)
PLATELETS: 230 10*3/uL (ref 150–400)
RBC: 4.16 MIL/uL — ABNORMAL LOW (ref 4.22–5.81)
RDW: 13 % (ref 11.5–15.5)
WBC: 3.7 10*3/uL — AB (ref 4.0–10.5)

## 2016-05-16 LAB — TYPE AND SCREEN
ABO/RH(D): A NEG
ANTIBODY SCREEN: NEGATIVE

## 2016-05-16 LAB — SURGICAL PCR SCREEN
MRSA, PCR: NEGATIVE
STAPHYLOCOCCUS AUREUS: NEGATIVE

## 2016-05-16 NOTE — Progress Notes (Signed)
   05/16/16 1018  [place]  Have you ever been diagnosed with sleep apnea through a sleep study? Yes  If yes, do you have and use a CPAP or BPAP machine every night? 1  Do you snore loudly (loud enough to be heard through closed doors)?  0  Do you often feel tired, fatigued, or sleepy during the daytime (such as falling asleep during driving or talking to someone)? 0  Has anyone observed you stop breathing during your sleep? 0  Do you have, or are you being treated for high blood pressure? 1  BMI more than 35 kg/m2? 1  Age > 50 (1-yes) 1  Neck circumference greater than:Male 16 inches or larger, Male 17inches or larger? 1  Male Gender (Yes=1) 1  Obstructive Sleep Apnea Score 5  Score 5 or greater  Results sent to PCP

## 2016-05-16 NOTE — Progress Notes (Signed)
   05/16/16 1018  [place]  Have you ever been diagnosed with sleep apnea through a sleep study? Yes  If yes, do you have and use a CPAP or BPAP machine every night? 1  Do you snore loudly (loud enough to be heard through closed doors)?  0  Do you often feel tired, fatigued, or sleepy during the daytime (such as falling asleep during driving or talking to someone)? 0  Has anyone observed you stop breathing during your sleep? 0  Do you have, or are you being treated for high blood pressure? 1  BMI more than 35 kg/m2? 1  Age > 50 (1-yes) 1  Neck circumference greater than:Male 16 inches or larger, Male 17inches or larger? 1  Male Gender (Yes=1) 1  Obstructive Sleep Apnea Score 5  Score 5 or greater  Results sent to PCP     05/16/16 1018  OBSTRUCTIVE SLEEP APNEA  Have you ever been diagnosed with sleep apnea through a sleep study? Yes  If yes, do you have and use a CPAP or BPAP machine every night? 1  Do you snore loudly (loud enough to be heard through closed doors)?  0  Do you often feel tired, fatigued, or sleepy during the daytime (such as falling asleep during driving or talking to someone)? 0  Has anyone observed you stop breathing during your sleep? 0  Do you have, or are you being treated for high blood pressure? 1  BMI more than 35 kg/m2? 1  Age > 50 (1-yes) 1  Neck circumference greater than:Male 16 inches or larger, Male 17inches or larger? 1  Male Gender (Yes=1) 1  Obstructive Sleep Apnea Score 5  Score 5 or greater  Results sent to PCP

## 2016-05-23 NOTE — Progress Notes (Signed)
ekg 7/17 on chart, clearance dr corbett, stress eccho 05/23/16 all on chart

## 2016-05-23 NOTE — Progress Notes (Signed)
Requested clearance, last ekg dr corbett req stat report from stress eccho done this am at [facility]

## 2016-05-26 MED ORDER — VANCOMYCIN HCL 10 G IV SOLR
1500.0000 mg | INTRAVENOUS | Status: AC
  Administered 2016-05-27: 1500 mg via INTRAVENOUS
  Filled 2016-05-26: qty 1500

## 2016-05-26 NOTE — H&P (Signed)
TOTAL HIP REVISION ADMISSION H&P  Patient is admitted for right revision total hip arthroplasty.  Subjective:  Chief Complaint: Right hip pain S/P THA  HPI: [name], 55 y.o. male, has a history of pain and functional disability in the right hip due to [facility] and patient has failed non-surgical conservative treatments for greater than 12 weeks to include NSAID's and/or analgesics, use of assistive devices and activity modification. The indications for the revision total hip arthroplasty are bearing surface wear leading to  symptomatic synovitis.  Onset of symptoms was abrupt starting 6+ months ago with rapidlly worsening course since that time.  Prior procedures on the right hip include arthroplasty.  Patient currently rates pain in the right hip at 4 out of 10 with activity.  There is worsening of pain with activity and weight bearing, trendelenberg gait, pain that interfers with activities of daily living and pain with passive range of motion. Patient has evidence of previous metal-on-metal THA by imaging studies.  This condition presents safety issues increasing the risk of falls.   There is no current active infection.   Risks, benefits and expectations were discussed with the patient.  Risks including but not limited to the risk of anesthesia, blood clots, nerve damage, blood vessel damage, failure of the prosthesis, infection and up to and including death.  Patient understand the risks, benefits and expectations and wishes to proceed with surgery.   PCP: No PCP Per Patient  D/C Plans:      Home with HHPT  Post-op Meds:       No Rx given  Tranexamic Acid:      To be given - IV   Decadron:      Is to be given  FYI:     ASA  Norco    Patient Active Problem List   Diagnosis Date Noted  . Obese 07/19/2015  . 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 HYPERTENSION 11/06/2010   Past Medical History:  Diagnosis Date  . Arthritis   . History of blood transfusion   . Hypertension    . Pneumonia    hx of  . PONV (postoperative nausea and vomiting)     Past Surgical History:  Procedure Laterality Date  . ANTERIOR HIP REVISION Left 07/17/2015   Procedure: LEFT POSTERIOR  HIP REVISION;  Surgeon: Paralee Cancel, MD;  Location: [facility];  Service: Orthopedics;  Laterality: Left;  . BACK SURGERY     four back surgeries (3 fusions)  . JOINT REPLACEMENT     3 surgeries on right hip, 1 on left hip  . right knee surgery - torn maniscus    . TONSILLECTOMY     adnoids removed    No prescriptions prior to admission.   Allergies  Allergen Reactions  . Ace Inhibitors Rash  . Cephalexin Rash  . Doxycycline Rash    Social History  Substance Use Topics  . Smoking status: Former Smoker    Quit date: 10/14/2004  . Smokeless tobacco: Never Used  . Alcohol use 1.2 oz/week    2 Cans of beer per week     Comment: occasionally        Review of Systems  Constitutional: Negative.   HENT: Negative.   Eyes: Negative.   Respiratory: Negative.   Cardiovascular: Negative.   Gastrointestinal: Negative.   Genitourinary: Negative.   Musculoskeletal: Positive for back pain and joint pain.  Skin: Negative.   Neurological: Negative.   Endo/Heme/Allergies: Negative.   Psychiatric/Behavioral: Negative.  Objective:  Physical Exam  Constitutional: He is oriented to person, place, and time. He appears well-developed.  HENT:  Head: Normocephalic.  Eyes: Pupils are equal, round, and reactive to light.  Neck: Neck supple. No JVD present. No tracheal deviation present. No thyromegaly present.  Cardiovascular: Normal rate, regular rhythm, normal heart sounds and intact distal pulses.   Respiratory: Effort normal and breath sounds normal. No respiratory distress. He has no wheezes.  GI: Soft. There is no tenderness. There is no guarding.  Musculoskeletal:       Right hip: He exhibits decreased range of motion, decreased strength, tenderness, bony tenderness and laceration (healed  previous incision).  Lymphadenopathy:    He has no cervical adenopathy.  Neurological: He is alert and oriented to person, place, and time.  Skin: Skin is warm and dry.  Psychiatric: He has a normal mood and affect.     Labs:  Estimated body mass index is 36.02 kg/m as calculated from the following:   Height as of 05/16/16: 5\' 7"  (1.702 m).   Weight as of 05/16/16: 104.3 kg (230 lb).  Imaging Review:  Plain radiographs demonstrate previous metal-on-metal THA of the right hip(s). The bone quality appears to be good for age and reported activity level.   Assessment/Plan:  Right hip(s) with failed previous arthroplasty.  The patient history, physical examination, clinical judgement of the provider and imaging studies are consistent with failure of the right hip(s), previous total hip arthroplasty. Revision total hip arthroplasty is deemed medically necessary. The treatment options including medical management, injection therapy, arthroscopy and arthroplasty were discussed at length. The risks and benefits of total hip arthroplasty were presented and reviewed. The risks due to aseptic loosening, infection, stiffness, dislocation/subluxation,  thromboembolic complications and other imponderables were discussed.  The patient acknowledged the explanation, agreed to proceed with the plan and consent was signed. Patient is being admitted for inpatient treatment for surgery, pain control, PT, OT, prophylactic antibiotics, VTE prophylaxis, progressive ambulation and ADL's and discharge planning. The patient is planning to be discharged home with home health services.     [place] Krissa Utke   PA-C  05/26/2016, 9:44 PM

## 2016-05-27 ENCOUNTER — Inpatient Hospital Stay (HOSPITAL_COMMUNITY): Payer: Worker's Compensation

## 2016-05-27 ENCOUNTER — Inpatient Hospital Stay (HOSPITAL_COMMUNITY): Payer: Worker's Compensation | Admitting: Anesthesiology

## 2016-05-27 ENCOUNTER — Encounter (HOSPITAL_COMMUNITY)
Admission: RE | Disposition: A | Discharge status: Home Health | Payer: Self-pay | Source: Ambulatory Visit | Attending: Orthopedic Surgery

## 2016-05-27 ENCOUNTER — Encounter (HOSPITAL_COMMUNITY): Payer: Self-pay

## 2016-05-27 ENCOUNTER — Inpatient Hospital Stay (HOSPITAL_COMMUNITY)
Admission: RE | Admit: 2016-05-27 | Discharge: 2016-05-29 | DRG: 468 | Disposition: A | Discharge status: Home Health | Payer: Worker's Compensation | Source: Ambulatory Visit | Attending: Orthopedic Surgery | Admitting: Orthopedic Surgery

## 2016-05-27 DIAGNOSIS — Z6836 Body mass index (BMI) 36.0-36.9, adult: Secondary | ICD-10-CM | POA: Diagnosis not present

## 2016-05-27 DIAGNOSIS — Z888 Allergy status to other drugs, medicaments and biological substances status: Secondary | ICD-10-CM | POA: Diagnosis not present

## 2016-05-27 DIAGNOSIS — I10 Essential (primary) hypertension: Secondary | ICD-10-CM | POA: Diagnosis present

## 2016-05-27 DIAGNOSIS — T84090A Other mechanical complication of internal right hip prosthesis, initial encounter: Secondary | ICD-10-CM | POA: Diagnosis present

## 2016-05-27 DIAGNOSIS — E669 Obesity, unspecified: Secondary | ICD-10-CM | POA: Diagnosis present

## 2016-05-27 DIAGNOSIS — Z87891 Personal history of nicotine dependence: Secondary | ICD-10-CM | POA: Diagnosis not present

## 2016-05-27 DIAGNOSIS — Y838 Other surgical procedures as the cause of abnormal reaction of the patient, or of later complication, without mention of misadventure at the time of the procedure: Secondary | ICD-10-CM | POA: Diagnosis present

## 2016-05-27 DIAGNOSIS — Z881 Allergy status to other antibiotic agents status: Secondary | ICD-10-CM

## 2016-05-27 DIAGNOSIS — Y792 Prosthetic and other implants, materials and accessory orthopedic devices associated with adverse incidents: Secondary | ICD-10-CM | POA: Diagnosis present

## 2016-05-27 DIAGNOSIS — M25551 Pain in right hip: Secondary | ICD-10-CM | POA: Diagnosis present

## 2016-05-27 DIAGNOSIS — Z96649 Presence of unspecified artificial hip joint: Secondary | ICD-10-CM

## 2016-05-27 HISTORY — PX: TOTAL HIP REVISION: SHX763

## 2016-05-27 SURGERY — TOTAL HIP REVISION
Anesthesia: General | Site: Hip | Laterality: Right

## 2016-05-27 MED ORDER — MIDAZOLAM HCL 5 MG/5ML IJ SOLN
INTRAMUSCULAR | Status: DC | PRN
  Administered 2016-05-27: 2 mg via INTRAVENOUS

## 2016-05-27 MED ORDER — PHENYLEPHRINE 40 MCG/ML (10ML) SYRINGE FOR IV PUSH (FOR BLOOD PRESSURE SUPPORT)
PREFILLED_SYRINGE | INTRAVENOUS | Status: AC
  Filled 2016-05-27: qty 10

## 2016-05-27 MED ORDER — GLYCOPYRROLATE 0.2 MG/ML IJ SOLN
INTRAMUSCULAR | Status: DC | PRN
  Administered 2016-05-27: 0.2 mg via INTRAVENOUS

## 2016-05-27 MED ORDER — POLYETHYLENE GLYCOL 3350 17 G PO PACK
17.0000 g | PACK | Freq: Two times a day (BID) | ORAL | Status: DC
  Administered 2016-05-27 – 2016-05-29 (×4): 17 g via ORAL
  Filled 2016-05-27 (×4): qty 1

## 2016-05-27 MED ORDER — DIPHENHYDRAMINE HCL 25 MG PO CAPS
25.0000 mg | ORAL_CAPSULE | Freq: Four times a day (QID) | ORAL | Status: DC | PRN
  Administered 2016-05-27: 25 mg via ORAL
  Filled 2016-05-27: qty 1

## 2016-05-27 MED ORDER — CELECOXIB 200 MG PO CAPS
200.0000 mg | ORAL_CAPSULE | Freq: Two times a day (BID) | ORAL | Status: DC
  Administered 2016-05-27 – 2016-05-29 (×4): 200 mg via ORAL
  Filled 2016-05-27 (×4): qty 1

## 2016-05-27 MED ORDER — FERROUS SULFATE 325 (65 FE) MG PO TABS
325.0000 mg | ORAL_TABLET | Freq: Three times a day (TID) | ORAL | Status: DC
  Administered 2016-05-28 – 2016-05-29 (×4): 325 mg via ORAL
  Filled 2016-05-27 (×6): qty 1

## 2016-05-27 MED ORDER — DEXAMETHASONE SODIUM PHOSPHATE 10 MG/ML IJ SOLN: 10.0000 mg | Freq: Once | INTRAMUSCULAR | Status: DC

## 2016-05-27 MED ORDER — METOCLOPRAMIDE HCL 5 MG/ML IJ SOLN: 5.0000 mg | Freq: Three times a day (TID) | INTRAMUSCULAR | Status: DC | PRN

## 2016-05-27 MED ORDER — FENTANYL CITRATE (PF) 100 MCG/2ML IJ SOLN
INTRAMUSCULAR | Status: AC
  Filled 2016-05-27: qty 2

## 2016-05-27 MED ORDER — TRANEXAMIC ACID 1000 MG/10ML IV SOLN
1000.0000 mg | INTRAVENOUS | Status: AC
  Administered 2016-05-27: 1000 mg via INTRAVENOUS
  Filled 2016-05-27: qty 10

## 2016-05-27 MED ORDER — ONDANSETRON HCL 4 MG/2ML IJ SOLN
INTRAMUSCULAR | Status: DC | PRN
  Administered 2016-05-27: 4 mg via INTRAVENOUS

## 2016-05-27 MED ORDER — METHOCARBAMOL 500 MG PO TABS
500.0000 mg | ORAL_TABLET | Freq: Four times a day (QID) | ORAL | Status: DC | PRN
Start: 2016-05-27 — End: 2016-05-29

## 2016-05-27 MED ORDER — LIDOCAINE HCL (CARDIAC) 20 MG/ML IV SOLN
INTRAVENOUS | Status: DC | PRN
  Administered 2016-05-27: 100 mg via INTRAVENOUS

## 2016-05-27 MED ORDER — PHENOL 1.4 % MT LIQD
1.0000 | OROMUCOSAL | Status: DC | PRN
  Filled 2016-05-27: qty 177

## 2016-05-27 MED ORDER — MAGNESIUM CITRATE PO SOLN: 1.0000 | Freq: Once | ORAL | Status: DC | PRN

## 2016-05-27 MED ORDER — PROPOFOL 10 MG/ML IV BOLUS
INTRAVENOUS | Status: DC | PRN
  Administered 2016-05-27: 210 mg via INTRAVENOUS

## 2016-05-27 MED ORDER — PROPOFOL 10 MG/ML IV BOLUS
INTRAVENOUS | Status: AC
  Filled 2016-05-27: qty 40

## 2016-05-27 MED ORDER — MENTHOL 3 MG MT LOZG: 1.0000 | LOZENGE | OROMUCOSAL | Status: DC | PRN

## 2016-05-27 MED ORDER — DOCUSATE SODIUM 100 MG PO CAPS
100.0000 mg | ORAL_CAPSULE | Freq: Two times a day (BID) | ORAL | Status: DC
  Administered 2016-05-27 – 2016-05-29 (×4): 100 mg via ORAL
  Filled 2016-05-27 (×4): qty 1

## 2016-05-27 MED ORDER — HYDROMORPHONE HCL 1 MG/ML IJ SOLN
INTRAMUSCULAR | Status: AC
  Filled 2016-05-27: qty 1

## 2016-05-27 MED ORDER — ONDANSETRON HCL 4 MG/2ML IJ SOLN: 4.0000 mg | Freq: Four times a day (QID) | INTRAMUSCULAR | Status: DC | PRN

## 2016-05-27 MED ORDER — EPHEDRINE SULFATE 50 MG/ML IJ SOLN
INTRAMUSCULAR | Status: DC | PRN
  Administered 2016-05-27: 10 mg via INTRAVENOUS

## 2016-05-27 MED ORDER — FENTANYL CITRATE (PF) 100 MCG/2ML IJ SOLN
INTRAMUSCULAR | Status: AC
Start: 2016-05-27 — End: 2016-05-27
  Filled 2016-05-27: qty 2

## 2016-05-27 MED ORDER — SUGAMMADEX SODIUM 200 MG/2ML IV SOLN
INTRAVENOUS | Status: DC | PRN
  Administered 2016-05-27: 300 mg via INTRAVENOUS

## 2016-05-27 MED ORDER — SUGAMMADEX SODIUM 500 MG/5ML IV SOLN
INTRAVENOUS | Status: AC
  Filled 2016-05-27: qty 5

## 2016-05-27 MED ORDER — MIDAZOLAM HCL 2 MG/2ML IJ SOLN
INTRAMUSCULAR | Status: AC
  Filled 2016-05-27: qty 2

## 2016-05-27 MED ORDER — SODIUM CHLORIDE 0.9 % IV SOLN
1000.0000 mg | Freq: Once | INTRAVENOUS | Status: AC
  Administered 2016-05-27: 1000 mg via INTRAVENOUS
  Filled 2016-05-27: qty 1100

## 2016-05-27 MED ORDER — BISACODYL 10 MG RE SUPP: 10.0000 mg | Freq: Every day | RECTAL | Status: DC | PRN

## 2016-05-27 MED ORDER — GLYCOPYRROLATE 0.2 MG/ML IJ SOLN
INTRAMUSCULAR | Status: AC
  Filled 2016-05-27: qty 1

## 2016-05-27 MED ORDER — DEXAMETHASONE SODIUM PHOSPHATE 10 MG/ML IJ SOLN
10.0000 mg | Freq: Once | INTRAMUSCULAR | Status: AC
  Administered 2016-05-28: 10 mg via INTRAVENOUS
  Filled 2016-05-27: qty 1

## 2016-05-27 MED ORDER — ASPIRIN 81 MG PO CHEW
81.0000 mg | CHEWABLE_TABLET | Freq: Two times a day (BID) | ORAL | Status: DC
  Administered 2016-05-27 – 2016-05-29 (×4): 81 mg via ORAL
  Filled 2016-05-27 (×4): qty 1

## 2016-05-27 MED ORDER — PHENYLEPHRINE HCL 10 MG/ML IJ SOLN
INTRAMUSCULAR | Status: DC | PRN
  Administered 2016-05-27 (×2): 80 ug via INTRAVENOUS

## 2016-05-27 MED ORDER — ONDANSETRON HCL 4 MG/2ML IJ SOLN
INTRAMUSCULAR | Status: AC
  Filled 2016-05-27: qty 2

## 2016-05-27 MED ORDER — ROCURONIUM BROMIDE 100 MG/10ML IV SOLN
INTRAVENOUS | Status: DC | PRN
  Administered 2016-05-27: 50 mg via INTRAVENOUS
  Administered 2016-05-27: 20 mg via INTRAVENOUS

## 2016-05-27 MED ORDER — ROCURONIUM BROMIDE 100 MG/10ML IV SOLN
INTRAVENOUS | Status: AC
  Filled 2016-05-27: qty 1

## 2016-05-27 MED ORDER — ONDANSETRON HCL 4 MG PO TABS: 4.0000 mg | ORAL_TABLET | Freq: Four times a day (QID) | ORAL | Status: DC | PRN

## 2016-05-27 MED ORDER — DEXAMETHASONE SODIUM PHOSPHATE 10 MG/ML IJ SOLN
INTRAMUSCULAR | Status: AC
  Filled 2016-05-27: qty 1

## 2016-05-27 MED ORDER — SODIUM CHLORIDE 0.9 % IV SOLN
INTRAVENOUS | Status: DC
  Administered 2016-05-27 – 2016-05-28 (×3): via INTRAVENOUS

## 2016-05-27 MED ORDER — IRBESARTAN 150 MG PO TABS
300.0000 mg | ORAL_TABLET | Freq: Every day | ORAL | Status: DC
  Administered 2016-05-29: 300 mg via ORAL
  Filled 2016-05-27 (×3): qty 2

## 2016-05-27 MED ORDER — METOCLOPRAMIDE HCL 5 MG PO TABS: 5.0000 mg | ORAL_TABLET | Freq: Three times a day (TID) | ORAL | Status: DC | PRN

## 2016-05-27 MED ORDER — HYDROMORPHONE HCL 1 MG/ML IJ SOLN: 0.5000 mg | INTRAMUSCULAR | Status: DC | PRN

## 2016-05-27 MED ORDER — HYDROCODONE-ACETAMINOPHEN 7.5-325 MG PO TABS
1.0000 | ORAL_TABLET | ORAL | Status: DC
  Administered 2016-05-27 – 2016-05-28 (×6): 2 via ORAL
  Administered 2016-05-28: 1 via ORAL
  Administered 2016-05-28 – 2016-05-29 (×4): 2 via ORAL
  Filled 2016-05-27 (×12): qty 2

## 2016-05-27 MED ORDER — SODIUM CHLORIDE 0.9 % IR SOLN
Status: DC | PRN
  Administered 2016-05-27: 1000 mL

## 2016-05-27 MED ORDER — HYDROMORPHONE HCL 1 MG/ML IJ SOLN
0.2500 mg | INTRAMUSCULAR | Status: DC | PRN
  Administered 2016-05-27 (×4): 0.5 mg via INTRAVENOUS

## 2016-05-27 MED ORDER — SUCCINYLCHOLINE CHLORIDE 20 MG/ML IJ SOLN
INTRAMUSCULAR | Status: DC | PRN
  Administered 2016-05-27: 100 mg via INTRAVENOUS

## 2016-05-27 MED ORDER — DEXAMETHASONE SODIUM PHOSPHATE 10 MG/ML IJ SOLN
10.0000 mg | Freq: Once | INTRAMUSCULAR | Status: AC
  Administered 2016-05-27: 10 mg via INTRAVENOUS

## 2016-05-27 MED ORDER — CARVEDILOL 12.5 MG PO TABS
12.5000 mg | ORAL_TABLET | Freq: Two times a day (BID) | ORAL | Status: DC
  Administered 2016-05-27 – 2016-05-29 (×4): 12.5 mg via ORAL
  Filled 2016-05-27 (×4): qty 1

## 2016-05-27 MED ORDER — ALUM & MAG HYDROXIDE-SIMETH 200-200-20 MG/5ML PO SUSP: 30.0000 mL | ORAL | Status: DC | PRN

## 2016-05-27 MED ORDER — FENTANYL CITRATE (PF) 100 MCG/2ML IJ SOLN
INTRAMUSCULAR | Status: DC | PRN
  Administered 2016-05-27 (×2): 100 ug via INTRAVENOUS

## 2016-05-27 MED ORDER — LIDOCAINE HCL (CARDIAC) 20 MG/ML IV SOLN
INTRAVENOUS | Status: AC
  Filled 2016-05-27: qty 5

## 2016-05-27 MED ORDER — CHLORHEXIDINE GLUCONATE 4 % EX LIQD: 60.0000 mL | Freq: Once | CUTANEOUS | Status: DC

## 2016-05-27 MED ORDER — LACTATED RINGERS IV SOLN
INTRAVENOUS | Status: DC
  Administered 2016-05-27 (×3): via INTRAVENOUS

## 2016-05-27 MED ORDER — AMLODIPINE BESYLATE 10 MG PO TABS
10.0000 mg | ORAL_TABLET | Freq: Every morning | ORAL | Status: DC
  Administered 2016-05-29: 10 mg via ORAL
  Filled 2016-05-27 (×2): qty 1

## 2016-05-27 MED ORDER — METHOCARBAMOL 1000 MG/10ML IJ SOLN
500.0000 mg | Freq: Four times a day (QID) | INTRAVENOUS | Status: DC | PRN
  Filled 2016-05-27: qty 5

## 2016-05-27 MED ORDER — VANCOMYCIN HCL 10 G IV SOLR
1500.0000 mg | Freq: Two times a day (BID) | INTRAVENOUS | Status: AC
  Administered 2016-05-27: 1500 mg via INTRAVENOUS
  Filled 2016-05-27: qty 1500

## 2016-05-27 SURGICAL SUPPLY — 34 items
BAG ZIPLOCK 12X15 (MISCELLANEOUS) IMPLANT
CLOTH BEACON ORANGE TIMEOUT ST (SAFETY) ×2 IMPLANT
COVER PERINEAL POST (MISCELLANEOUS) ×2 IMPLANT
DRAPE STERI IOBAN 125X83 (DRAPES) ×2 IMPLANT
DRAPE U-SHAPE 47X51 STRL (DRAPES) ×4 IMPLANT
DRSG AQUACEL AG ADV 3.5X10 (GAUZE/BANDAGES/DRESSINGS) ×2 IMPLANT
DRSG AQUACEL AG ADV 3.5X14 (GAUZE/BANDAGES/DRESSINGS) ×2 IMPLANT
DURAPREP 26ML APPLICATOR (WOUND CARE) ×2 IMPLANT
ELECT REM PT RETURN 15FT ADLT (MISCELLANEOUS) IMPLANT
ELECT REM PT RETURN 9FT ADLT (ELECTROSURGICAL) ×2
ELECTRODE REM PT RTRN 9FT ADLT (ELECTROSURGICAL) ×1 IMPLANT
GLOVE BIOGEL M 7.0 STRL (GLOVE) IMPLANT
GLOVE BIOGEL PI IND STRL 7.5 (GLOVE) ×1 IMPLANT
GLOVE BIOGEL PI IND STRL 8.5 (GLOVE) ×1 IMPLANT
GLOVE BIOGEL PI INDICATOR 7.5 (GLOVE) ×1
GLOVE BIOGEL PI INDICATOR 8.5 (GLOVE) ×1
GLOVE ECLIPSE 8.0 STRL XLNG CF (GLOVE) ×2 IMPLANT
GLOVE ORTHO TXT STRL SZ7.5 (GLOVE) ×4 IMPLANT
GOWN STRL REUS W/TWL LRG LVL3 (GOWN DISPOSABLE) ×2 IMPLANT
GOWN STRL REUS W/TWL XL LVL3 (GOWN DISPOSABLE) ×2 IMPLANT
HEAD FEM DLT TS CER 36X+5.0 (Head) ×2 IMPLANT
HOLDER FOLEY CATH W/STRAP (MISCELLANEOUS) ×2 IMPLANT
LINER NEUTRAL 64X36 P4 (Hips) ×2 IMPLANT
LIQUID BAND (GAUZE/BANDAGES/DRESSINGS) ×2 IMPLANT
PACK ANTERIOR HIP CUSTOM (KITS) ×2 IMPLANT
SAW OSC TIP CART 19.5X105X1.3 (SAW) ×2 IMPLANT
SUT MNCRL AB 4-0 PS2 18 (SUTURE) ×2 IMPLANT
SUT VIC AB 1 CT1 36 (SUTURE) ×6 IMPLANT
SUT VIC AB 2-0 CT1 27 (SUTURE) ×2
SUT VIC AB 2-0 CT1 TAPERPNT 27 (SUTURE) ×2 IMPLANT
SUT VLOC 180 0 24IN GS25 (SUTURE) ×2 IMPLANT
TRAY FOLEY W/METER SILVER 14FR (SET/KITS/TRAYS/PACK) ×2 IMPLANT
TRAY FOLEY W/METER SILVER 16FR (SET/KITS/TRAYS/PACK) ×2 IMPLANT
WATER STERILE IRR 1500ML POUR (IV SOLUTION) ×2 IMPLANT

## 2016-05-27 NOTE — Op Note (Signed)
Christopher, Riddle NO.:  [id number]  MEDICAL RECORD NO.:  [id number]  LOCATION:  50                         FACILITY:  [facility]  PHYSICIAN:  [name], M.D.  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  PROCEDURE:  Revision of right total hip arthroplasty utilizing DePuy component, size 64 x 36 +4 neutral AltrX liner, a size 36+ 5 Delta ceramic ball with a titanium sleeve.  SURGEON:  [name], M.D.  ASSISTANT:  Danae Orleans, PA-C.  ANESTHESIA:  General.  BLOOD LOSS:  300 mL.  COMPLICATION:  None.  DRAINS:  None.  FINDINGS:  The patient was noted to have a significant large effusion comparable that with the MRI findings.  He had metal-stained synovial lining, but no evidence of any muscle or bone damage.  INDICATIONS FOR PROCEDURE:  [name] is a pleasant 55 year old male, patient of mine with previous revision right total hip arthroplasty with a large head metal-on-metal hip system.  We had subsequently revised his left hip.  He recently began to having some problems with the right hip, workup based on his metal-on-metal Pinnacle cup with 44-mm head revealed that he had an elevated Cobalt and chromium levels as well as MRI findings of fluid collection considering in the joint.  Based on this pain and the laboratory findings, we felt it was in his best interest to proceed with revision hip surgery.  We reviewed the risk of infection, DVT, component failure, dislocation, potential need for revision surgery.  Based on the previously-revised components and the stability of the femoral and acetabular shell, I did not plan to revise these components.  Consent was obtained.  PROCEDURE IN DETAIL:  The patient was brought to the  operative theater. Once adequate anesthesia, preoperative antibiotics, vancomycin 1.5 g, 1 g of tranexamic acid, and 10 mg of Decadron administered, he was positioned into the left lateral decubitus position with the right side up.  The right lower extremity was then prepped and draped in sterile fashion.  Time-out was performed identifying the patient, planned procedure and extremity.  Portion of his old incision was utilized.  Soft tissue dissection was carried down to the iliotibial band and gluteal fascia.  There was some pseudocapsular layer in this bursal region that I excised.  The iliotibial band and gluteal fascia were then incised for posterior approach.  At this point, the posterior aspect of the hip was exposed. Once I entered the pseudocapsule of the hip joint, there was a large metal-stained fluid collection that was evacuated from the joint itself.  At this point, we spent time, performing the synovectomy, removing this metal-stained synovium as previously identified.  Once I had the hip exposed, we were able to dislocate the hip.  The acetabular shell was noted to be in anteversion, which I knew preoperatively, but again based on the remaining bone stock in the acetabulum, I did not want to change this out.  A femoral head was  then removed.  At this point, I spent further time debriding the synovium in the remaining anterior third of the acetabulum as best as possible.  At this point, I was able to use the jig to remove the acetabular liner. It was challenge to move the trunnion out of the way of the acetabular shell based on the orientation and tightness of the soft tissues, but we were able to negotiate this.  Once the acetabular liner was removed, I irrigated the acetabular shell and then placed the 36+ 4 neutral AltrX liner to match this 64 shell.  It was then positioned into the acetabulum and the metal liner was removed and then impacted and positioned with  good secure rim fit.  At this point, did trial reductions the 44 ball and used the -2 ball, but I wanted to offset here to help with leg lengths, but also to allow for improved range of motion to prevent any potential neck impingement. I trialed with a 1.5 and then a 5 ball.  The 5 ball, we were able to reduce, I felt that we were going to add some help with his leg lengths, but also to improve this distance and mobility of the femoral head in relation to the trunnion by improving the head-neck ratio.  The final 36+ 5 Delta ceramic ball with a titanium sleeve was then impacted on the clean and dried trunnion.  The hip was reduced.  We irrigated the hip throughout the case again at this point.  There was no posterior capsule to repair based on the synovectomy as required.  With thus reapproximated the iliotibial band and gluteal fascia using #1 Vicryl and 0 V-Loc suture.  The remainder of the wound was closed with 2- 0 Vicryl and a running 3-0 Monocryl.  The hip was cleaned, dried and dressed sterilely using surgical glue and Aquacel dressing.  He was then woken from anesthesia and brought to the recovery room in stable condition tolerating the procedure well.  He will be weightbearing as tolerated.  Posterior hip precaution could be reminded.     [name], M.D.     MDO/MEDQ  D:  05/27/2016  T:  05/27/2016  Job:  [id number]

## 2016-05-27 NOTE — Brief Op Note (Signed)
05/27/2016  12:26 PM  PATIENT:  [name]  55 y.o. 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   Delay start of Pharmacological VTE agent (>24hrs) due to surgical blood loss or risk of bleeding: no

## 2016-05-27 NOTE — Anesthesia Preprocedure Evaluation (Addendum)
Anesthesia Evaluation  Patient identified by MRN, date of birth, ID band Patient awake  General Assessment Comment:History noted. CE  Reviewed: Allergy & Precautions, NPO status , Patient's Chart, lab work & pertinent test results  History of Anesthesia Complications (+) PONV  Airway Mallampati: II  TM Distance: >3 FB Neck ROM: Full    Dental   Pulmonary pneumonia, former smoker,    breath sounds clear to auscultation       Cardiovascular hypertension,  Rhythm:Regular Rate:Normal     Neuro/Psych    GI/Hepatic negative GI ROS, Neg liver ROS,   Endo/Other  negative endocrine ROS  Renal/GU negative Renal ROS     Musculoskeletal  (+) Arthritis ,   Abdominal   Peds  Hematology   Anesthesia Other Findings   Reproductive/Obstetrics                            Anesthesia Physical Anesthesia Plan  ASA: III  Anesthesia Plan: General   Post-op Pain Management:    Induction: Intravenous  Airway Management Planned: Oral ETT and Video Laryngoscope Planned  Additional Equipment:   Intra-op Plan:   Post-operative Plan: Extubation in OR  Informed Consent:   Dental advisory given  Plan Discussed with: CRNA and Anesthesiologist  Anesthesia Plan Comments:         Anesthesia Quick Evaluation

## 2016-05-27 NOTE — Interval H&P Note (Signed)
History and Physical Interval Note:  05/27/2016 11:11 AM  [name]  has presented today for surgery, with the diagnosis of failed right total hip due to metallosis  The various methods of treatment have been discussed with the patient and family. After consideration of risks, benefits and other options for treatment, the patient has consented to  Procedure(s): TOTAL HIP REVISION (Right) as a surgical intervention .  The patient's history has been reviewed, patient examined, no change in status, stable for surgery.  I have reviewed the patient's chart and labs.  Questions were answered to the patient's satisfaction.     [name]

## 2016-05-27 NOTE — Transfer of Care (Signed)
Immediate Anesthesia Transfer of Care Note  Patient: [name]  Procedure(s) Performed: Procedure(s): TOTAL HIP REVISION (Right)  Patient Location: PACU  Anesthesia Type:General  Level of Consciousness: sedated  Airway & Oxygen Therapy: Patient Spontanous Breathing and Patient connected to face mask oxygen  Post-op Assessment: Report given to RN and Post -op Vital signs reviewed and stable  Post vital signs: Reviewed and stable  Last Vitals:  Vitals:   05/27/16 0731  BP: (!) 166/73  Pulse: 68  Resp: 16  Temp: 36.7 C    Last Pain:  Vitals:   05/27/16 1112  TempSrc:   PainSc: 0-No pain         Complications: No apparent anesthesia complications

## 2016-05-27 NOTE — Anesthesia Procedure Notes (Signed)
Procedure Name: Intubation Date/Time: 05/27/2016 12:16 PM Performed by: Lind Covert Pre-anesthesia Checklist: Patient identified, Timeout performed, Emergency Drugs available, Suction available and Patient being monitored Patient Re-evaluated:Patient Re-evaluated prior to inductionOxygen Delivery Method: Circle system utilized Preoxygenation: Pre-oxygenation with 100% oxygen Intubation Type: IV induction Laryngoscope Size: Mac, 4 and Glidescope Grade View: Grade I Tube type: Oral Tube size: 7.5 mm Number of attempts: 1 Airway Equipment and Method: Stylet and Video-laryngoscopy (glidescope used due to previous airway difficulty) Placement Confirmation: ETT inserted through vocal cords under direct vision,  positive ETCO2 and breath sounds checked- equal and bilateral Secured at: 22 cm Tube secured with: Tape Dental Injury: Teeth and Oropharynx as per pre-operative assessment

## 2016-05-27 NOTE — Anesthesia Postprocedure Evaluation (Signed)
Anesthesia Post Note  Patient: [name]  Procedure(s) Performed: Procedure(s) (LRB): TOTAL HIP REVISION (Right)  Patient location during evaluation: PACU Anesthesia Type: General Level of consciousness: awake Pain management: pain level controlled Vital Signs Assessment: post-procedure vital signs reviewed and stable Respiratory status: spontaneous breathing Cardiovascular status: stable Anesthetic complications: no    Last Vitals:  Vitals:   05/27/16 1507 05/27/16 1519  BP: 129/72 133/64  Pulse: (!) 59 62  Resp: 11 16  Temp:  36.4 C    Last Pain:  Vitals:   05/27/16 1507  TempSrc:   PainSc: 4                  EDWARDS,Elizer Bostic

## 2016-05-28 LAB — BASIC METABOLIC PANEL WITH GFR
Anion gap: 8 (ref 5–15)
BUN: 8 mg/dL (ref 6–20)
CO2: 24 mmol/L (ref 22–32)
Calcium: 8.9 mg/dL (ref 8.9–10.3)
Chloride: 100 mmol/L — ABNORMAL LOW (ref 101–111)
Creatinine, Ser: 0.7 mg/dL (ref 0.61–1.24)
GFR calc Af Amer: >60 mL/min
GFR calc non Af Amer: >60 mL/min
Glucose, Bld: 140 mg/dL — ABNORMAL HIGH (ref 65–99)
Potassium: 4 mmol/L (ref 3.5–5.1)
Sodium: 132 mmol/L — ABNORMAL LOW (ref 135–145)

## 2016-05-28 LAB — CBC
HCT: 37 % — ABNORMAL LOW (ref 39.0–52.0)
Hemoglobin: 13.2 g/dL (ref 13.0–17.0)
MCH: 34.2 pg — ABNORMAL HIGH (ref 26.0–34.0)
MCHC: 35.7 g/dL (ref 30.0–36.0)
MCV: 95.9 fL (ref 78.0–100.0)
Platelets: 222 10*3/uL (ref 150–400)
RBC: 3.86 MIL/uL — ABNORMAL LOW (ref 4.22–5.81)
RDW: 13 % (ref 11.5–15.5)
WBC: 9.4 10*3/uL (ref 4.0–10.5)

## 2016-05-28 NOTE — Evaluation (Signed)
Physical Therapy Evaluation Patient Details Name: [name] MRN: [id number] DOB: 1960-10-18 Today's Date: 05/28/2016   History of Present Illness  55 y.o. male admitted for R THA revision. PMH of prior R THA revision and L THA, multiple back surgeries.   Clinical Impression  Pt is s/p THA revision resulting in the deficits listed below (see PT Problem List). Pt moving well, he ambulated 100' with RW, no loss of balance. Reviewed posterior precautions and R THA exercises. Good progress expected.  Pt will benefit from skilled PT to increase their independence and safety with mobility to allow discharge to the venue listed below.      Follow Up Recommendations Home health PT    Equipment Recommendations  None recommended by PT    Recommendations for Other Services       Precautions / Restrictions Precautions Precautions: Posterior Hip Precaution Booklet Issued: Yes (comment) Restrictions Weight Bearing Restrictions: No Other Position/Activity Restrictions: wbat      Mobility  Bed Mobility Overal bed mobility: Modified Independent             General bed mobility comments: HOB up 30*  Transfers Overall transfer level: Needs assistance Equipment used: Rolling walker (2 wheeled) Transfers: Sit to/from Stand Sit to Stand: Min guard         General transfer comment: VCs hand placement  Ambulation/Gait Ambulation/Gait assistance: Min guard Ambulation Distance (Feet): 100 Feet Assistive device: Rolling walker (2 wheeled) Gait Pattern/deviations: Step-to pattern   Gait velocity interpretation: Below normal speed for age/gender General Gait Details: VCs to roll rather than lift RW, VCs initially for sequencing, no LOB  Stairs            Wheelchair Mobility    Modified Rankin (Stroke Patients Only)       Balance Overall balance assessment: Modified Independent                                           Pertinent Vitals/Pain Pain  Assessment: 0-10 Pain Score: 6  Pain Location: R hip with movement    Home Living Family/patient expects to be discharged to:: Private residence Living Arrangements: Spouse/significant other Available Help at Discharge: Family;Available 24 hours/day Type of Home: House Home Access: Stairs to enter   [employer] of Steps: 1 Home Layout: Able to live on main level with bedroom/bathroom;Two level Home Equipment: Walker - 2 wheels;Adaptive equipment;Crutches;Bedside commode      Prior Function Level of Independence: Independent               Hand Dominance   Dominant Hand: Right    Extremity/Trunk Assessment   Upper Extremity Assessment: Overall WFL for tasks assessed           Lower Extremity Assessment: RLE deficits/detail RLE Deficits / Details: R knee ext +3/5, hip -3/5, ankle WNL    Cervical / Trunk Assessment: Normal  Communication   Communication: No difficulties  Cognition Arousal/Alertness: Awake/alert Behavior During Therapy: WFL for tasks assessed/performed Overall Cognitive Status: Within Functional Limits for tasks assessed                      General Comments      Exercises Total Joint Exercises Ankle Circles/Pumps: AROM;Both;10 reps Quad Sets: AROM;Both;5 reps Heel Slides: AAROM;Right;10 reps;Supine Hip ABduction/ADduction: AAROM;Right;10 reps;Supine Long Arc Quad: AROM;Right;10 reps;Seated      Assessment/Plan  PT Assessment Patient needs continued PT services  PT Diagnosis Difficulty walking   PT Problem List Decreased strength;Decreased activity tolerance;Decreased knowledge of precautions;Decreased mobility;Pain  PT Treatment Interventions DME instruction;Gait training;Stair training;Functional mobility training;Therapeutic exercise;Therapeutic activities;Patient/family education   PT Goals (Current goals can be found in the Care Plan section) Acute Rehab PT Goals 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, mining at lab PT Goal Formulation: With patient Time For Goal Achievement: 06/04/16 Potential to Achieve Goals: Good    Frequency 7X/week   Barriers to discharge        Co-evaluation               End of Session Equipment Utilized During Treatment: Gait belt Activity Tolerance: Patient tolerated treatment well Patient left: in chair;with call bell/phone within reach;with chair alarm set Nurse Communication: Mobility status         Time: [id number] PT Time Calculation (min) (ACUTE ONLY): 28 min   Charges:   PT Evaluation $PT Eval Low Complexity: 1 Procedure PT Treatments $Gait Training: 8-22 mins   PT G Codes:        Philomena Doheny 05/28/2016, 9:12 AM [phone]

## 2016-05-28 NOTE — Addendum Note (Signed)
Addendum  created 05/28/16 [id number] by Lind Covert, CRNA   Charge Capture section accepted

## 2016-05-28 NOTE — Care Management Note (Signed)
Case Management Note  Patient Details  Name: [name] MRN: [id number] Date of Birth: 11/28/1960  Subjective/Objective:                  TOTAL HIP REVISION (Right) Action/Plan: Discharge planning  Expected Discharge Date:                  Expected Discharge Plan:  Home w Home Health Services  In-House Referral:     Discharge planning Services  CM Consult  Post Acute Care Choice:  Home Health Choice offered to:  Patient  DME Arranged:  N/A DME Agency:  NA  HH Arranged:  Patient Refused HH Agency:  NA  Status of Service:  Completed, signed off  If discussed at Long Length of Stay Meetings, dates discussed:    Additional Comments: CM met with pt in room to get contact information for [employer] Worker's [employer] to arrange for home health services.  Pt states this is his 6th surgery and will NOT be having home health.  Pt states he has all the DME from previous surgeries and denies need for additional DME.  No other CM needs were communicated. Jeffries, Sarah Christine, RN 05/28/2016, 12:57 PM  

## 2016-05-28 NOTE — Progress Notes (Addendum)
Physical Therapy Treatment Patient Details Name: [name] MRN: [id number] DOB: 08-24-1961 Today's Date: 05/28/2016    History of Present Illness 55 y.o. male admitted for R THA revision. PMH of prior R THA revision and L THA, multiple back surgeries.     PT Comments    Pt progressing well with mobility, he tolerated increased distance with ambulation. Verbal cues for adhering to posterior hip precautions ( pt rotated trunk in standing to reach for something causing internal rotation at R hip).   Follow Up Recommendations  Home health PT     Equipment Recommendations  None recommended by PT    Recommendations for Other Services       Precautions / Restrictions Precautions Precautions: Posterior Hip Precaution Booklet Issued: Yes (comment) Precaution Comments: reviewed posterior hip precautions Restrictions Weight Bearing Restrictions: No Other Position/Activity Restrictions: wbat    Mobility  Bed Mobility Overal bed mobility: Modified Independent             General bed mobility comments: up in chair  Transfers Overall transfer level: Needs assistance Equipment used: Rolling walker (2 wheeled) Transfers: Sit to/from Stand Sit to Stand: Min guard         General transfer comment: VCs hand placement  Ambulation/Gait Ambulation/Gait assistance: Min guard Ambulation Distance (Feet): 150 Feet Assistive device: Rolling walker (2 wheeled) Gait Pattern/deviations: Step-to pattern   Gait velocity interpretation: at or above normal speed for age/gender General Gait Details: VCs to roll rather than lift RW, good sequencing, no LOB   Stairs            Wheelchair Mobility    Modified Rankin (Stroke Patients Only)       Balance Overall balance assessment: Modified Independent                                  Cognition Arousal/Alertness: Awake/alert Behavior During Therapy: WFL for tasks assessed/performed Overall Cognitive Status:  Within Functional Limits for tasks assessed                      Exercises Total Joint Exercises Ankle Circles/Pumps: AROM;Both;10 reps Quad Sets: AROM;Both;5 reps Heel Slides: AAROM;Right;10 reps;Supine Hip ABduction/ADduction: AAROM;Right;10 reps;Supine Long Arc Quad: AROM;Right;10 reps;Seated    General Comments        Pertinent Vitals/Pain Pain Assessment: 0-10 Pain Score: 2  Pain Location: R hip with walking Pain Descriptors / Indicators: Sore Pain Intervention(s): Limited activity within patient's tolerance;Monitored during session;Ice applied    Home Living Family/patient expects to be discharged to:: Private residence Living Arrangements: Spouse/significant other Available Help at Discharge: Family;Available 24 hours/day Type of Home: House Home Access: Stairs to enter   Home Layout: Able to live on main level with bedroom/bathroom;Two level Home Equipment: Walker - 2 wheels;Adaptive equipment;Crutches;Bedside commode      Prior Function Level of Independence: Independent          PT Goals (current goals can now be found in the care plan section) Acute Rehab PT Goals 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, sport and exercise at lab, to walk for exercise PT Goal Formulation: With patient Time For Goal Achievement: 06/04/16 Potential to Achieve Goals: Good Progress towards PT goals: Progressing toward goals    Frequency  7X/week    PT Plan Current plan remains appropriate    Co-evaluation             End of Session Equipment  Utilized During Treatment: Gait belt Activity Tolerance: Patient tolerated treatment well Patient left: with call bell/phone within reach;in bed     Time: 1205-1217 PT Time Calculation (min) (ACUTE ONLY): 12 min  Charges:  $Gait Training: 8-22 mins                    G Codes:      Philomena Doheny 05/28/2016, 12:21 PM [phone]

## 2016-05-28 NOTE — Progress Notes (Signed)
OT Cancellation Note  Patient Details Name: [name] MRN: [id number] DOB: 03-25-1961   Cancelled Treatment:    Reason Eval/Treat Not Completed: Other (comment). Screen. This is pt's 4th hip sx; revision done due to metallosis.  Pt has DME/AE and feels comfortable with posterior THPs.  Reviewed aiming out to side to avoid IR and standing for hygiene for 90 degrees.  No further OT needs at this time  [facility] 05/28/2016, 9:50 AM  Lesle Chris, OTR/L [phone] 05/28/2016

## 2016-05-28 NOTE — Progress Notes (Signed)
     Subjective: 1 Day Post-Op Procedure(s) (LRB): TOTAL HIP REVISION (Right)   Patient reports pain as moderate, pain controlled. No reported events throughout the night.  Ready to work with PT to progress with the right hip.  Objective:   VITALS:   Vitals:   05/28/16 0956 05/28/16 1005  BP: (!) 120/56 121/68  Pulse: (!) 59 (!) 58  Resp: 18   Temp: 97.5 F (36.4 C)     Dorsiflexion/Plantar flexion intact Incision: dressing C/D/I No cellulitis present Compartment soft  LABS  Recent Labs  05/28/16 0418  HGB 13.2  HCT 37.0*  WBC 9.4  PLT 222     Recent Labs  05/28/16 0418  NA 132*  K 4.0  BUN 8  CREATININE 0.70  GLUCOSE 140*     Assessment/Plan: 1 Day Post-Op Procedure(s) (LRB): TOTAL HIP REVISION (Right) Foley cath d/c'ed Advance diet Up with therapy D/C IV fluids Discharge home, eventually when ready  Obese (BMI 30-39.9) Estimated body mass index is 36.02 kg/m as calculated from the following:   Height as of this encounter: 5\' 7"  (1.702 m).   Weight as of this encounter: 104.3 kg (230 lb). Patient also counseled that weight may inhibit the healing process Patient counseled that losing weight will help with future health issues      [name]. [name]   PAC  05/28/2016, 11:06 AM

## 2016-05-29 LAB — BASIC METABOLIC PANEL
Anion gap: 7 (ref 5–15)
BUN: 14 mg/dL (ref 6–20)
CO2: 25 mmol/L (ref 22–32)
Calcium: 9.2 mg/dL (ref 8.9–10.3)
Chloride: 103 mmol/L (ref 101–111)
Creatinine, Ser: 0.78 mg/dL (ref 0.61–1.24)
GFR calc Af Amer: >60 mL/min (ref >60)
GFR calc non Af Amer: >60 mL/min (ref >60)
Glucose, Bld: 136 mg/dL — ABNORMAL HIGH (ref 65–99)
Potassium: 4.1 mmol/L (ref 3.5–5.1)
Sodium: 135 mmol/L (ref 135–145)

## 2016-05-29 LAB — CBC
HCT: 34.8 % — ABNORMAL LOW (ref 39.0–52.0)
Hemoglobin: 12.4 g/dL — ABNORMAL LOW (ref 13.0–17.0)
MCH: 33.9 pg (ref 26.0–34.0)
MCHC: 35.6 g/dL (ref 30.0–36.0)
MCV: 95.1 fL (ref 78.0–100.0)
PLATELETS: 212 10*3/uL (ref 150–400)
RBC: 3.66 MIL/uL — ABNORMAL LOW (ref 4.22–5.81)
RDW: 13.1 % (ref 11.5–15.5)
WBC: 10.9 10*3/uL — ABNORMAL HIGH (ref 4.0–10.5)

## 2016-05-29 MED ORDER — METHOCARBAMOL 500 MG PO TABS: 500.0000 mg | ORAL_TABLET | Freq: Four times a day (QID) | ORAL | 0 refills | Status: DC | PRN

## 2016-05-29 MED ORDER — FERROUS SULFATE 325 (65 FE) MG PO TABS: 325.0000 mg | ORAL_TABLET | Freq: Three times a day (TID) | ORAL | 3 refills | Status: DC

## 2016-05-29 MED ORDER — HYDROCODONE-ACETAMINOPHEN 7.5-325 MG PO TABS: 1.0000 | ORAL_TABLET | ORAL | 0 refills | Status: DC | PRN

## 2016-05-29 MED ORDER — ASPIRIN 81 MG PO CHEW: 81.0000 mg | CHEWABLE_TABLET | Freq: Two times a day (BID) | ORAL | 0 refills | Status: AC

## 2016-05-29 MED ORDER — POLYETHYLENE GLYCOL 3350 17 G PO PACK: 17.0000 g | PACK | Freq: Two times a day (BID) | ORAL | 0 refills | Status: DC

## 2016-05-29 MED ORDER — DOCUSATE SODIUM 100 MG PO CAPS: 100.0000 mg | ORAL_CAPSULE | Freq: Two times a day (BID) | ORAL | 0 refills | Status: DC

## 2016-05-29 NOTE — Progress Notes (Signed)
Physical Therapy Treatment Patient Details Name: [name] MRN: [id number] DOB: [birth year] Today's Date: 05/29/2016    History of Present Illness 55 y.o. male admitted for R THA revision. PMH of prior R THA revision and L THA, multiple back surgeries.     PT Comments    POD # 2 Assisted with amb a greater distance, practiced one step then performed TE's following HEP.  Instructed on proper tech and freq plus use of ICE.   Pt ready for D/C to home.  Follow Up Recommendations  Home health PT     Equipment Recommendations  None recommended by PT    Recommendations for Other Services       Precautions / Restrictions Precautions Precautions: Posterior Hip Precaution Comments: reviewed posterior hip precautions, demonstarted and instructed Restrictions Weight Bearing Restrictions: No Other Position/Activity Restrictions: wbat    Mobility  Bed Mobility Overal bed mobility: Modified Independent             General bed mobility comments: increased time  Transfers Overall transfer level: Needs assistance Equipment used: Rolling walker (2 wheeled) Transfers: Sit to/from Stand Sit to Stand: Supervision         General transfer comment: VCs hand placement esp with stand to sit  Ambulation/Gait Ambulation/Gait assistance: Supervision Ambulation Distance (Feet): 145 Feet Assistive device: Rolling walker (2 wheeled) Gait Pattern/deviations: Step-to pattern;Step-through pattern Gait velocity: decreased   General Gait Details: increased time and one VC safety with turns   Stairs Stairs: Yes Stairs assistance: Min guard Stair Management: No rails;Step to pattern;Forwards Number of Stairs: 1 General stair comments: 25% VC's on proper sequencing and walker placement  Wheelchair Mobility    Modified Rankin (Stroke Patients Only)       Balance                                    Cognition Arousal/Alertness: Awake/alert Behavior During  Therapy: WFL for tasks assessed/performed Overall Cognitive Status: Within Functional Limits for tasks assessed                      Exercises   Total Hip Replacement TE's 10 reps ankle pumps 10 reps knee presses 10 reps heel slides 10 reps SAQ's 10 reps ABD Followed by ICE     General Comments        Pertinent Vitals/Pain Pain Assessment: 0-10 Pain Score: 3  Pain Location: R hip with TE's Pain Descriptors / Indicators: Sore;Tender Pain Intervention(s): Monitored during session;Repositioned;Ice applied    Home Living                      Prior Function            PT Goals (current goals can now be found in the care plan section) Progress towards PT goals: Progressing toward goals    Frequency  7X/week    PT Plan Current plan remains appropriate    Co-evaluation             End of Session Equipment Utilized During Treatment: Gait belt Activity Tolerance: Patient tolerated treatment well Patient left: in bed;with call bell/phone within reach     Time: 1110-1134 PT Time Calculation (min) (ACUTE ONLY): 24 min  Charges:  $Gait Training: 8-22 mins $Therapeutic Exercise: 8-22 mins                    G  Codes:      Rica Koyanagi  PTA [facility]  Acute  Rehab Pager      [phone]

## 2016-05-29 NOTE — Progress Notes (Signed)
     Subjective: 2 Days Post-Op Procedure(s) (LRB): TOTAL HIP REVISION (Right)   Patient reports pain as mild, pain well controlled. No events throughout the night.  Understands his restrictions.  Ready to be discharged home.   Objective:   VITALS:   Vitals:   05/28/16 2130 05/29/16 0441  BP: (!) 142/67 (!) 153/69  Pulse: 65 60  Resp: 18 14  Temp: 97.5 F (36.4 C) 97.8 F (36.6 C)    Dorsiflexion/Plantar flexion intact Incision: dressing C/D/I No cellulitis present Compartment soft  LABS  Recent Labs  05/28/16 0418 05/29/16 0430  HGB 13.2 12.4*  HCT 37.0* 34.8*  WBC 9.4 10.9*  PLT 222 212     Recent Labs  05/28/16 0418 05/29/16 0430  NA 132* 135  K 4.0 4.1  BUN 8 14  CREATININE 0.70 0.78  GLUCOSE 140* 136*     Assessment/Plan: 2 Days Post-Op Procedure(s) (LRB): TOTAL HIP REVISION (Right) Up with therapy Discharge home Follow up in 2 weeks at [facility]. Follow up with OLIN,Erielle Gawronski D in 2 weeks.  Contact information:  [facility] [address] [place] [id number]    Obese (BMI 30-39.9) Estimated body mass index is 36.02 kg/m as calculated from the following:   Height as of this encounter: 5\' 7"  (1.702 m).   Weight as of this encounter: 104.3 kg (230 lb). Patient also counseled that weight may inhibit the healing process Patient counseled that losing weight will help with future health issues         West Pugh. Jatin Naumann   PAC  05/29/2016, 8:46 AM

## 2016-05-29 NOTE — Discharge Instructions (Signed)

## 2016-05-30 NOTE — Discharge Summary (Signed)
Physician Discharge Summary  Patient ID: [name] MRN: [id number] DOB/AGE: 12-14-1960 55 y.o.  Admit date: 05/27/2016 Discharge date: 05/29/2016   Procedures:  Procedure(s) (LRB): TOTAL HIP REVISION (Right)  Attending Physician:  Dr. Paralee Cancel   Admission Diagnoses:   Right hip pain S/P THA  Discharge Diagnoses:  Principal Problem:   S/P revision right THA Active Problems:   Obese  Past Medical History:  Diagnosis Date  . Arthritis   . History of blood transfusion   . Hypertension   . Pneumonia    hx of  . PONV (postoperative nausea and vomiting)     HPI:    [name], 55 y.o. male, has a history of pain and functional disability in the right hip due to [facility] and patient has failed non-surgical conservative treatments for greater than 12 weeks to include NSAID's and/or analgesics, use of assistive devices and activity modification. The indications for the revision total hip arthroplasty are bearing surface wear leading to  symptomatic synovitis. Onset of symptoms was abrupt starting 6+ months ago with rapidlly worsening course since that time.  Prior procedures on the right hip include arthroplasty. Patient currently rates pain in the right hip at 4 out of 10 with activity.  There is worsening of pain with activity and weight bearing, trendelenberg gait, pain that interfers with activities of daily living and pain with passive range of motion. Patient has evidence of previous metal-on-metal THA by imaging studies.  This condition presents safety issues increasing the risk of falls.  There is no current active infection.   Risks, benefits and expectations were discussed with the patient.  Risks including but not limited to the risk of anesthesia, blood clots, nerve damage, blood vessel damage, failure of the prosthesis, infection and up to and including death.  Patient understand the risks, benefits and expectations and wishes to proceed with surgery.   PCP: No PCP  Per Patient   Discharged Condition: good  Hospital Course:  Patient underwent the above stated procedure on 05/27/2016. Patient tolerated the procedure well and brought to the recovery room in good condition and subsequently to the floor.  POD #1 BP: 121/68 ; Pulse: 58 ; Temp: 97.5 F (36.4 C) ; Resp: 18 Patient reports pain as moderate, pain controlled. No reported events throughout the night.  Ready to work with PT to progress with the right hip. Dorsiflexion/plantar flexion intact, incision: dressing C/D/I, no cellulitis present and compartment soft.   LABS  Basename    HGB     13.2  HCT     37.0   POD #2  BP: 153/69 ; Pulse: 60 ; Temp: 97.8 F (36.6 C) ; Resp: 14 Patient reports pain as mild, pain well controlled. No events throughout the night.  Understands his restrictions.  Ready to be discharged home.  Dorsiflexion/plantar flexion intact, incision: dressing C/D/I, no cellulitis present and compartment soft.   LABS  Basename    HGB     12.4  HCT     34.8    Discharge Exam: General appearance: alert, cooperative and no distress Extremities: Homans sign is negative, no sign of DVT, no edema, redness or tenderness in the calves or thighs and no ulcers, gangrene or trophic changes  Disposition: Home with follow up in 2 weeks   Follow-up Information    [name], MD. Schedule an appointment as soon as possible for a visit in 2 week(s).   Specialty:  Orthopedic Surgery Contact information: [address] [place]  [address] [place] [address] [phone]           Discharge Instructions    Call MD / Call 911    Complete by:  As directed   If you experience chest pain or shortness of breath, CALL 911 and be transported to the hospital emergency room.  If you develope a fever above 101 F, pus (white drainage) or increased drainage or redness at the wound, or calf pain, call your surgeon's office.   Change dressing    Complete by:  As directed   Maintain surgical  dressing until follow up in the clinic. If the edges start to pull up, may reinforce with tape. If the dressing is no longer working, may remove and cover with gauze and tape, but must keep the area dry and clean.  Call with any questions or concerns.   Constipation Prevention    Complete by:  As directed   Drink plenty of fluids.  Prune juice may be helpful.  You may use a stool softener, such as Colace (over the counter) 100 mg twice a day.  Use MiraLax (over the counter) for constipation as needed.   Diet - low sodium heart healthy    Complete by:  As directed   Discharge instructions    Complete by:  As directed   Maintain surgical dressing until follow up in the clinic. If the edges start to pull up, may reinforce with tape. If the dressing is no longer working, may remove and cover with gauze and tape, but must keep the area dry and clean.  Follow up in 2 weeks at [facility]. Call with any questions or concerns.   Increase activity slowly as tolerated    Complete by:  As directed   Weight bearing as tolerated with assist device (walker, cane, etc) as directed, use it as long as suggested by your surgeon or therapist, typically at least 4-6 weeks.   TED hose    Complete by:  As directed   Use stockings (TED hose) for 2 weeks on both leg(s).  You may remove them at night for sleeping.        Medication List    STOP taking these medications   naproxen sodium 220 MG tablet Commonly known as:  ANAPROX     TAKE these medications   amLODipine 10 MG tablet Commonly known as:  NORVASC Take 10 mg by mouth every morning.   aspirin 81 MG chewable tablet Chew 1 tablet (81 mg total) by mouth 2 (two) times daily. Take for 4 weeks.   carvedilol 12.5 MG tablet Commonly known as:  COREG Take 12.5 mg by mouth 2 (two) times daily.   docusate sodium 100 MG capsule Commonly known as:  COLACE Take 1 capsule (100 mg total) by mouth 2 (two) times daily.   ferrous sulfate 325 (65 FE) MG  tablet Take 1 tablet (325 mg total) by mouth 3 (three) times daily after meals.   HYDROcodone-acetaminophen 7.5-325 MG tablet Commonly known as:  NORCO Take 1-2 tablets by mouth every 4 (four) hours as needed for moderate pain.   methocarbamol 500 MG tablet Commonly known as:  ROBAXIN Take 1 tablet (500 mg total) by mouth every 6 (six) hours as needed for muscle spasms.   multivitamin with minerals Tabs tablet Take 1 tablet by mouth daily.   polyethylene glycol packet Commonly known as:  MIRALAX / GLYCOLAX Take 17 g by mouth 2 (two) times daily.   valsartan 320 MG  tablet Commonly known as:  DIOVAN Take 320 mg by mouth every morning.        Signed: [place] Pugh. Bryella Diviney   PA-C  05/30/2016, 12:44 PM

## 2016-12-31 ENCOUNTER — Inpatient Hospital Stay (HOSPITAL_COMMUNITY)
Admission: RE | Admit: 2016-12-31 | Discharge: 2017-01-09 | DRG: 464 | Disposition: A | Discharge status: Home Health | Payer: Worker's Compensation | Source: Ambulatory Visit | Attending: Orthopedic Surgery | Admitting: Orthopedic Surgery

## 2016-12-31 ENCOUNTER — Other Ambulatory Visit: Payer: Self-pay | Admitting: Orthopedic Surgery

## 2016-12-31 ENCOUNTER — Inpatient Hospital Stay (HOSPITAL_COMMUNITY): Payer: Worker's Compensation | Admitting: Certified Registered"

## 2016-12-31 ENCOUNTER — Encounter (HOSPITAL_COMMUNITY): Payer: Self-pay | Admitting: *Deleted

## 2016-12-31 ENCOUNTER — Encounter (HOSPITAL_COMMUNITY)
Admission: RE | Disposition: A | Discharge status: Home Health | Payer: Self-pay | Source: Ambulatory Visit | Attending: Orthopedic Surgery

## 2016-12-31 ENCOUNTER — Inpatient Hospital Stay (HOSPITAL_COMMUNITY): Payer: Worker's Compensation

## 2016-12-31 DIAGNOSIS — T8450XA Infection and inflammatory reaction due to unspecified internal joint prosthesis, initial encounter: Secondary | ICD-10-CM

## 2016-12-31 DIAGNOSIS — Z888 Allergy status to other drugs, medicaments and biological substances status: Secondary | ICD-10-CM | POA: Diagnosis not present

## 2016-12-31 DIAGNOSIS — N289 Disorder of kidney and ureter, unspecified: Secondary | ICD-10-CM

## 2016-12-31 DIAGNOSIS — Z96641 Presence of right artificial hip joint: Secondary | ICD-10-CM

## 2016-12-31 DIAGNOSIS — Z96649 Presence of unspecified artificial hip joint: Secondary | ICD-10-CM

## 2016-12-31 DIAGNOSIS — I10 Essential (primary) hypertension: Secondary | ICD-10-CM | POA: Diagnosis present

## 2016-12-31 DIAGNOSIS — N179 Acute kidney failure, unspecified: Secondary | ICD-10-CM | POA: Diagnosis present

## 2016-12-31 DIAGNOSIS — I9581 Postprocedural hypotension: Secondary | ICD-10-CM | POA: Diagnosis not present

## 2016-12-31 DIAGNOSIS — Z95828 Presence of other vascular implants and grafts: Secondary | ICD-10-CM | POA: Diagnosis not present

## 2016-12-31 DIAGNOSIS — K5900 Constipation, unspecified: Secondary | ICD-10-CM | POA: Diagnosis not present

## 2016-12-31 DIAGNOSIS — T8451XA Infection and inflammatory reaction due to internal right hip prosthesis, initial encounter: Secondary | ICD-10-CM | POA: Diagnosis present

## 2016-12-31 DIAGNOSIS — M25551 Pain in right hip: Secondary | ICD-10-CM | POA: Diagnosis present

## 2016-12-31 DIAGNOSIS — R7881 Bacteremia: Secondary | ICD-10-CM | POA: Diagnosis present

## 2016-12-31 DIAGNOSIS — R600 Localized edema: Secondary | ICD-10-CM | POA: Diagnosis not present

## 2016-12-31 DIAGNOSIS — I959 Hypotension, unspecified: Secondary | ICD-10-CM | POA: Diagnosis not present

## 2016-12-31 DIAGNOSIS — Y831 Surgical operation with implant of artificial internal device as the cause of abnormal reaction of the patient, or of later complication, without mention of misadventure at the time of the procedure: Secondary | ICD-10-CM | POA: Diagnosis present

## 2016-12-31 DIAGNOSIS — Z87891 Personal history of nicotine dependence: Secondary | ICD-10-CM

## 2016-12-31 DIAGNOSIS — B9561 Methicillin susceptible Staphylococcus aureus infection as the cause of diseases classified elsewhere: Secondary | ICD-10-CM | POA: Diagnosis present

## 2016-12-31 DIAGNOSIS — R609 Edema, unspecified: Secondary | ICD-10-CM | POA: Diagnosis not present

## 2016-12-31 DIAGNOSIS — M8618 Other acute osteomyelitis, other site: Secondary | ICD-10-CM | POA: Diagnosis not present

## 2016-12-31 DIAGNOSIS — T8451XD Infection and inflammatory reaction due to internal right hip prosthesis, subsequent encounter: Secondary | ICD-10-CM | POA: Diagnosis not present

## 2016-12-31 DIAGNOSIS — E861 Hypovolemia: Secondary | ICD-10-CM | POA: Diagnosis not present

## 2016-12-31 DIAGNOSIS — Z471 Aftercare following joint replacement surgery: Secondary | ICD-10-CM

## 2016-12-31 DIAGNOSIS — Y792 Prosthetic and other implants, materials and accessory orthopedic devices associated with adverse incidents: Secondary | ICD-10-CM | POA: Diagnosis not present

## 2016-12-31 DIAGNOSIS — M96842 Postprocedural seroma of a musculoskeletal structure following a musculoskeletal system procedure: Secondary | ICD-10-CM | POA: Diagnosis not present

## 2016-12-31 DIAGNOSIS — Z881 Allergy status to other antibiotic agents status: Secondary | ICD-10-CM | POA: Diagnosis not present

## 2016-12-31 DIAGNOSIS — B9689 Other specified bacterial agents as the cause of diseases classified elsewhere: Secondary | ICD-10-CM | POA: Diagnosis not present

## 2016-12-31 DIAGNOSIS — Z01811 Encounter for preprocedural respiratory examination: Secondary | ICD-10-CM | POA: Diagnosis not present

## 2016-12-31 DIAGNOSIS — E871 Hypo-osmolality and hyponatremia: Secondary | ICD-10-CM | POA: Diagnosis present

## 2016-12-31 DIAGNOSIS — M868X8 Other osteomyelitis, other site: Secondary | ICD-10-CM | POA: Diagnosis present

## 2016-12-31 DIAGNOSIS — T8459XD Infection and inflammatory reaction due to other internal joint prosthesis, subsequent encounter: Secondary | ICD-10-CM | POA: Diagnosis not present

## 2016-12-31 DIAGNOSIS — Z9689 Presence of other specified functional implants: Secondary | ICD-10-CM | POA: Diagnosis not present

## 2016-12-31 DIAGNOSIS — M00051 Staphylococcal arthritis, right hip: Secondary | ICD-10-CM | POA: Diagnosis not present

## 2016-12-31 LAB — CBC
HEMATOCRIT: 30.8 % — AB (ref 39.0–52.0)
Hemoglobin: 11.2 g/dL — ABNORMAL LOW (ref 13.0–17.0)
MCH: 33.5 pg (ref 26.0–34.0)
MCHC: 36.4 g/dL — ABNORMAL HIGH (ref 30.0–36.0)
MCV: 92.2 fL (ref 78.0–100.0)
Platelets: 211 10*3/uL (ref 150–400)
RBC: 3.34 MIL/uL — ABNORMAL LOW (ref 4.22–5.81)
RDW: 12.9 % (ref 11.5–15.5)
WBC: 10.6 10*3/uL — AB (ref 4.0–10.5)

## 2016-12-31 LAB — BASIC METABOLIC PANEL
ANION GAP: 11 (ref 5–15)
BUN: 31 mg/dL — ABNORMAL HIGH (ref 6–20)
CALCIUM: 9.2 mg/dL (ref 8.9–10.3)
CO2: 20 mmol/L — AB (ref 22–32)
CREATININE: 1.62 mg/dL — AB (ref 0.61–1.24)
Chloride: 91 mmol/L — ABNORMAL LOW (ref 101–111)
GFR calc Af Amer: 53 mL/min — ABNORMAL LOW (ref >60)
GFR calc non Af Amer: 46 mL/min — ABNORMAL LOW (ref >60)
Glucose, Bld: 111 mg/dL — ABNORMAL HIGH (ref 65–99)
Potassium: 4 mmol/L (ref 3.5–5.1)
Sodium: 122 mmol/L — ABNORMAL LOW (ref 135–145)

## 2016-12-31 LAB — COMPREHENSIVE METABOLIC PANEL
ALT: 28 U/L (ref 17–63)
AST: 44 U/L — AB (ref 15–41)
Albumin: 3.3 g/dL — ABNORMAL LOW (ref 3.5–5.0)
Alkaline Phosphatase: 56 U/L (ref 38–126)
Anion gap: 12 (ref 5–15)
BILIRUBIN TOTAL: 1 mg/dL (ref 0.3–1.2)
BUN: 33 mg/dL — AB (ref 6–20)
CO2: 19 mmol/L — ABNORMAL LOW (ref 22–32)
CREATININE: 1.82 mg/dL — AB (ref 0.61–1.24)
Calcium: 9 mg/dL (ref 8.9–10.3)
Chloride: 90 mmol/L — ABNORMAL LOW (ref 101–111)
GFR, EST AFRICAN AMERICAN: 46 mL/min — AB (ref >60)
GFR, EST NON AFRICAN AMERICAN: 40 mL/min — AB (ref >60)
Glucose, Bld: 123 mg/dL — ABNORMAL HIGH (ref 65–99)
POTASSIUM: 4.1 mmol/L (ref 3.5–5.1)
Sodium: 121 mmol/L — ABNORMAL LOW (ref 135–145)
TOTAL PROTEIN: 7.1 g/dL (ref 6.5–8.1)

## 2016-12-31 SURGERY — IRRIGATION AND DEBRIDEMENT HIP WITH POLY EXCHANGE
Anesthesia: General | Laterality: Right

## 2016-12-31 MED ORDER — SODIUM CHLORIDE 0.9 % IV BOLUS (SEPSIS)
500.0000 mL | Freq: Once | INTRAVENOUS | Status: AC
  Administered 2016-12-31: 500 mL via INTRAVENOUS

## 2016-12-31 MED ORDER — ENOXAPARIN SODIUM 40 MG/0.4ML ~~LOC~~ SOLN
40.0000 mg | SUBCUTANEOUS | Status: DC
  Administered 2016-12-31: 40 mg via SUBCUTANEOUS
  Filled 2016-12-31: qty 0.4

## 2016-12-31 MED ORDER — ONDANSETRON HCL 4 MG/2ML IJ SOLN
INTRAMUSCULAR | Status: AC
  Filled 2016-12-31: qty 2

## 2016-12-31 MED ORDER — SODIUM CHLORIDE 0.9 % IV SOLN
INTRAVENOUS | Status: DC
  Administered 2016-12-31 – 2017-01-03 (×7): via INTRAVENOUS

## 2016-12-31 MED ORDER — VANCOMYCIN HCL 1000 MG IV SOLR: 1000.0000 mg | Freq: Once | INTRAVENOUS | Status: DC

## 2016-12-31 MED ORDER — FENTANYL CITRATE (PF) 100 MCG/2ML IJ SOLN
50.0000 ug | INTRAMUSCULAR | Status: DC | PRN
  Administered 2016-12-31: 50 ug via INTRAVENOUS

## 2016-12-31 MED ORDER — CIPROFLOXACIN IN D5W 400 MG/200ML IV SOLN
400.0000 mg | INTRAVENOUS | Status: AC
  Administered 2016-12-31: 400 mg via INTRAVENOUS
  Filled 2016-12-31: qty 200

## 2016-12-31 MED ORDER — ENOXAPARIN SODIUM 30 MG/0.3ML ~~LOC~~ SOLN: 30.0000 mg | SUBCUTANEOUS | Status: DC

## 2016-12-31 MED ORDER — ADULT MULTIVITAMIN W/MINERALS CH
1.0000 | ORAL_TABLET | Freq: Every day | ORAL | Status: DC
  Administered 2016-12-31 – 2017-01-09 (×8): 1 via ORAL
  Filled 2016-12-31 (×8): qty 1

## 2016-12-31 MED ORDER — SENNA 8.6 MG PO TABS
1.0000 | ORAL_TABLET | Freq: Two times a day (BID) | ORAL | Status: DC
  Administered 2016-12-31 – 2017-01-04 (×6): 8.6 mg via ORAL
  Filled 2016-12-31 (×13): qty 1

## 2016-12-31 MED ORDER — VITAMIN D (ERGOCALCIFEROL) 1.25 MG (50000 UNIT) PO CAPS
50000.0000 [IU] | ORAL_CAPSULE | ORAL | Status: DC
  Administered 2017-01-04: 50000 [IU] via ORAL
  Filled 2016-12-31: qty 1

## 2016-12-31 MED ORDER — FENTANYL CITRATE (PF) 100 MCG/2ML IJ SOLN
INTRAMUSCULAR | Status: AC
  Filled 2016-12-31: qty 2

## 2016-12-31 MED ORDER — SODIUM CHLORIDE 0.9 % IV SOLN
2000.0000 mg | INTRAVENOUS | Status: AC
Start: 2016-12-31 — End: 2017-01-01
  Administered 2016-12-31: 2000 mg via INTRAVENOUS
  Filled 2016-12-31: qty 2000

## 2016-12-31 MED ORDER — HYDROCODONE-ACETAMINOPHEN 5-325 MG PO TABS
1.0000 | ORAL_TABLET | Freq: Four times a day (QID) | ORAL | Status: DC | PRN
  Administered 2016-12-31: 1 via ORAL
  Administered 2016-12-31 – 2017-01-01 (×3): 2 via ORAL
  Filled 2016-12-31 (×3): qty 2
  Filled 2016-12-31: qty 1

## 2016-12-31 MED ORDER — FENTANYL CITRATE (PF) 100 MCG/2ML IJ SOLN
INTRAMUSCULAR | Status: AC
Start: 2016-12-31 — End: 2016-12-31
  Administered 2016-12-31: 50 ug via INTRAVENOUS
  Filled 2016-12-31: qty 2

## 2016-12-31 MED ORDER — LIDOCAINE 2% (20 MG/ML) 5 ML SYRINGE
INTRAMUSCULAR | Status: AC
  Filled 2016-12-31: qty 5

## 2016-12-31 MED ORDER — ACETAMINOPHEN 650 MG RE SUPP: 650.0000 mg | Freq: Four times a day (QID) | RECTAL | Status: DC | PRN

## 2016-12-31 MED ORDER — VANCOMYCIN HCL 10 G IV SOLR
1250.0000 mg | INTRAVENOUS | Status: DC
  Filled 2016-12-31: qty 1250

## 2016-12-31 MED ORDER — ONDANSETRON HCL 4 MG/2ML IJ SOLN: 4.0000 mg | Freq: Four times a day (QID) | INTRAMUSCULAR | Status: DC | PRN

## 2016-12-31 MED ORDER — FLEET ENEMA 7-19 GM/118ML RE ENEM: 1.0000 | ENEMA | Freq: Once | RECTAL | Status: DC | PRN

## 2016-12-31 MED ORDER — CARVEDILOL 12.5 MG PO TABS
12.5000 mg | ORAL_TABLET | Freq: Two times a day (BID) | ORAL | Status: DC
  Administered 2016-12-31 – 2017-01-01 (×3): 12.5 mg via ORAL
  Filled 2016-12-31 (×3): qty 1

## 2016-12-31 MED ORDER — DEXAMETHASONE SODIUM PHOSPHATE 10 MG/ML IJ SOLN
INTRAMUSCULAR | Status: AC
  Filled 2016-12-31: qty 1

## 2016-12-31 MED ORDER — CHLORHEXIDINE GLUCONATE 4 % EX LIQD: 60.0000 mL | Freq: Once | CUTANEOUS | Status: DC

## 2016-12-31 MED ORDER — LACTATED RINGERS IV SOLN
INTRAVENOUS | Status: DC
  Administered 2016-12-31: 12:00:00 via INTRAVENOUS

## 2016-12-31 MED ORDER — METRONIDAZOLE IN NACL 5-0.79 MG/ML-% IV SOLN
500.0000 mg | Freq: Three times a day (TID) | INTRAVENOUS | Status: DC
  Administered 2016-12-31 – 2017-01-01 (×3): 500 mg via INTRAVENOUS
  Filled 2016-12-31 (×4): qty 100

## 2016-12-31 MED ORDER — VANCOMYCIN HCL IN DEXTROSE 1-5 GM/200ML-% IV SOLN
INTRAVENOUS | Status: AC
  Filled 2016-12-31: qty 200

## 2016-12-31 MED ORDER — POLYETHYLENE GLYCOL 3350 17 G PO PACK
17.0000 g | PACK | Freq: Two times a day (BID) | ORAL | Status: DC
  Administered 2016-12-31 – 2017-01-03 (×4): 17 g via ORAL
  Filled 2016-12-31 (×12): qty 1

## 2016-12-31 MED ORDER — PROPOFOL 10 MG/ML IV BOLUS
INTRAVENOUS | Status: AC
  Filled 2016-12-31: qty 20

## 2016-12-31 MED ORDER — VANCOMYCIN HCL IN DEXTROSE 1-5 GM/200ML-% IV SOLN: 1000.0000 mg | INTRAVENOUS | Status: DC

## 2016-12-31 MED ORDER — MIDAZOLAM HCL 2 MG/2ML IJ SOLN
INTRAMUSCULAR | Status: AC
  Filled 2016-12-31: qty 2

## 2016-12-31 MED ORDER — ONDANSETRON HCL 4 MG PO TABS: 4.0000 mg | ORAL_TABLET | Freq: Four times a day (QID) | ORAL | Status: DC | PRN

## 2016-12-31 MED ORDER — ACETAMINOPHEN 325 MG PO TABS
650.0000 mg | ORAL_TABLET | Freq: Four times a day (QID) | ORAL | Status: DC | PRN
Start: 2016-12-31 — End: 2017-01-07

## 2016-12-31 MED ORDER — SODIUM CHLORIDE 0.9 % IV SOLN
INTRAVENOUS | Status: DC
  Administered 2016-12-31: 14:00:00 via INTRAVENOUS

## 2016-12-31 MED ORDER — CIPROFLOXACIN IN D5W 400 MG/200ML IV SOLN
400.0000 mg | Freq: Two times a day (BID) | INTRAVENOUS | Status: DC
  Administered 2017-01-01: 400 mg via INTRAVENOUS
  Filled 2016-12-31: qty 200

## 2016-12-31 NOTE — Consult Note (Signed)
History and Physical    [name] WNI:[id number] DOB: 1961/01/29 DOA: 12/31/2016  PCP: No PCP Per Patient  Patient coming from: Home   I have personally briefly reviewed patient's old medical records in [place]  Chief Complaint: Right hip pain, redness.   HPI: [name] is a 56 y.o. male with medical history significant of HTN, Multiples joint replacement riht hip, who presents for I and D and poly exchange for right hip infection. He was found to have low sodium at 121 and Cr at 1.8. He has been taking Diovan and poor oral intake. He denies diarrhea. He report constipation. He denies chest pain or dyspnea. He report one episode of vomit, fluid content.   ED Course: sodium at 121, cr 1.8, AST 44, WBC 10, Hb 11,   Review of Systems: As per HPI otherwise 10 point review of systems negative.    Past Medical History:  Diagnosis Date  . Arthritis   . History of blood transfusion   . Hypertension   . Pneumonia    hx of  . PONV (postoperative nausea and vomiting)     Past Surgical History:  Procedure Laterality Date  . ANTERIOR HIP REVISION Left 07/17/2015   Procedure: LEFT POSTERIOR  HIP REVISION;  Surgeon: Paralee Cancel, MD;  Location: [facility];  Service: Orthopedics;  Laterality: Left;  . BACK SURGERY     four back surgeries (3 fusions)  . JOINT REPLACEMENT     3 surgeries on right hip, 1 on left hip  . right knee surgery - torn maniscus    . TONSILLECTOMY     adnoids removed  . TOTAL HIP REVISION Right 05/27/2016   Procedure: TOTAL HIP REVISION;  Surgeon: Paralee Cancel, MD;  Location: [facility];  Service: Orthopedics;  Laterality: Right;     reports that he quit smoking about 12 years ago. He has never used smokeless tobacco. He reports that he drinks about 1.2 oz of alcohol per week . He reports that he does not use drugs.  Allergies  Allergen Reactions  . Ace Inhibitors Rash  . Cephalexin Rash  . Doxycycline Rash    Family History; no family history of kidney  diseases. Parents deceased.   Prior to Admission medications   Medication Sig Start Date End Date Taking? Authorizing Provider  amLODipine (NORVASC) 10 MG tablet Take 10 mg by mouth every morning.   Yes Historical Provider, MD  amLODipine (NORVASC) 2.5 MG tablet Take 2.5 mg by mouth daily. 10/25/16  Yes Historical Provider, MD  carvedilol (COREG) 12.5 MG tablet Take 12.5 mg by mouth 2 (two) times daily. 03/23/15  Yes Historical Provider, MD  docusate sodium (COLACE) 100 MG capsule Take 1 capsule (100 mg total) by mouth 2 (two) times daily. 05/29/16   Danae Orleans, PA-C  ferrous sulfate 325 (65 FE) MG tablet Take 1 tablet (325 mg total) by mouth 3 (three) times daily after meals. 05/29/16   Danae Orleans, PA-C  HYDROcodone-acetaminophen (NORCO) 7.5-325 MG tablet Take 1-2 tablets by mouth every 4 (four) hours as needed for moderate pain. 05/29/16   Danae Orleans, PA-C  methocarbamol (ROBAXIN) 500 MG tablet Take 1 tablet (500 mg total) by mouth every 6 (six) hours as needed for muscle spasms. 05/29/16   Danae Orleans, PA-C  Multiple Vitamin (MULTIVITAMIN WITH MINERALS) TABS tablet Take 1 tablet by mouth daily.    Historical Provider, MD  polyethylene glycol (MIRALAX / GLYCOLAX) packet Take 17 g by mouth 2 (two) times daily.  05/29/16   Danae Orleans, PA-C  valsartan (DIOVAN) 320 MG tablet Take 320 mg by mouth every morning.  06/23/15 06/22/16  Historical Provider, MD    Physical Exam: Vitals:   12/31/16 1126 12/31/16 1142  BP: 127/68   Pulse: 92   Resp: 18   Temp:  98 F (36.7 C)  TempSrc:  Oral  SpO2: 100%   Weight: 109.8 kg (242 lb)   Height: 5\' 7"  (1.702 m)     Constitutional: NAD, calm, comfortable Vitals:   12/31/16 1126 12/31/16 1142  BP: 127/68   Pulse: 92   Resp: 18   Temp:  98 F (36.7 C)  TempSrc:  Oral  SpO2: 100%   Weight: 109.8 kg (242 lb)   Height: 5\' 7"  (1.702 m)    Eyes: PERRL, lids and conjunctivae normal ENMT: Mucous membranes are moist. Posterior pharynx clear of  any exudate or lesions.Normal dentition.  Neck: normal, supple, no masses, no thyromegaly Respiratory: clear to auscultation bilaterally, no wheezing, no crackles. Normal respiratory effort. No accessory muscle use.  Cardiovascular: Regular rate and rhythm, no murmurs / rubs / gallops. No extremity edema. 2+ pedal pulses. No carotid bruits.  Abdomen: no tenderness, no masses palpated.  Bowel sounds positive. Distended.  Musculoskeletal:  Good ROM, no contractures. Normal muscle tone.  Skin: right hip with edema, redness, weeping fluids.  Neurologic: CN 2-12 grossly intact. Sensation intact, DTR normal. Strength 5/5 in all 4.  Psychiatric: Normal judgment and insight. Alert and oriented x 3. Normal mood.     Labs on Admission: I have personally reviewed following labs and imaging studies  CBC:  Recent Labs Lab 12/31/16 1136  WBC 10.6*  HGB 11.2*  HCT 30.8*  MCV 92.2  PLT 161   Basic Metabolic Panel:  Recent Labs Lab 12/31/16 1136  NA 121*  K 4.1  CL 90*  CO2 19*  GLUCOSE 123*  BUN 33*  CREATININE 1.82*  CALCIUM 9.0   GFR: Estimated Creatinine Clearance: 53 mL/min (A) (by C-G formula based on SCr of 1.82 mg/dL (H)). Liver Function Tests:  Recent Labs Lab 12/31/16 1136  AST 44*  ALT 28  ALKPHOS 56  BILITOT 1.0  PROT 7.1  ALBUMIN 3.3*   No results for input(s): LIPASE, AMYLASE in the last 168 hours. No results for input(s): AMMONIA in the last 168 hours. Coagulation Profile: No results for input(s): INR, PROTIME in the last 168 hours. Cardiac Enzymes: No results for input(s): CKTOTAL, CKMB, CKMBINDEX, TROPONINI in the last 168 hours. BNP (last 3 results) No results for input(s): PROBNP in the last 8760 hours. HbA1C: No results for input(s): HGBA1C in the last 72 hours. CBG: No results for input(s): GLUCAP in the last 168 hours. Lipid Profile: No results for input(s): CHOL, HDL, LDLCALC, TRIG, CHOLHDL, LDLDIRECT in the last 72 hours. Thyroid Function  Tests: No results for input(s): TSH, T4TOTAL, FREET4, T3FREE, THYROIDAB in the last 72 hours. Anemia Panel: No results for input(s): VITAMINB12, FOLATE, FERRITIN, TIBC, IRON, RETICCTPCT in the last 72 hours. Urine analysis:    Component Value Date/Time   COLORURINE YELLOW 07/10/2015 1030   APPEARANCEUR CLEAR 07/10/2015 1030   LABSPEC 1.012 07/10/2015 1030   PHURINE 6.0 07/10/2015 1030   GLUCOSEU NEGATIVE 07/10/2015 1030   HGBUR NEGATIVE 07/10/2015 1030   BILIRUBINUR NEGATIVE 07/10/2015 1030   KETONESUR NEGATIVE 07/10/2015 1030   PROTEINUR NEGATIVE 07/10/2015 1030   UROBILINOGEN 0.2 07/10/2015 1030   NITRITE NEGATIVE 07/10/2015 1030   LEUKOCYTESUR NEGATIVE 07/10/2015 1030  Radiological Exams on Admission: No results found.  EKG: ordered.   Assessment/Plan Active Problems:   Essential hypertension   Acute hyponatremia   Acute renal disease  1-AKI; last cr per records at 0.7 seven months ago/  Suspect related to hypovolemia, infection, and diovan use.  Hold diovan.  IV fluids.  Strict I and O.  Repeat labs in am.  If no improvement need renal US.   2-Right Hip infection; hardware Dr Noralee Chars planning I and D 3-21.  Will start IV vancomycin, pharmacy to dose for renal function.  IV ciprofloxacin and flagyl.  Blood culture ordered.   3-Hyponatremia;  Prior sodium level 129--133.  Suspect related to hypovolemia and renal failure.  IV fluids.   3-HTN; hold Norvasc and Diovan.. Continue with Coreg holder parameter for BP.  4-Constipation; miralax, senna.    DVT prophylaxis: lovenox.  Code Status: Full code.  Family Communication: Care discussed with patient.  Disposition Plan: to be Determine.  Admission status: inpatient    Niel Hummer A MD [facility] Pager [phone]  If 7PM-7AM, please contact night-coverage [email or web address] Password [facility]  12/31/2016, 2:37 PM

## 2016-12-31 NOTE — Progress Notes (Signed)
Istat done to verify sodium result, still 121.  Seen by hospitalist.  After discussion with Drs. Alvan Dame, hospitalist, and Dr. Smith Robert, decision made to postpone surgery till tomorrow and work up hyponatremia, renal labs.  IV changed earlier to NS, infusing at moderate rate.  Pt. Medicated for right hip pain with improved pain level.  Admission bed requested.

## 2016-12-31 NOTE — Progress Notes (Signed)
Lab results reported. Sodium level 121.  Reported to Drs. Hollis and Hooper. Will evaluate.

## 2016-12-31 NOTE — Anesthesia Preprocedure Evaluation (Deleted)
Anesthesia Evaluation    Airway        Dental   Pulmonary former smoker,           Cardiovascular hypertension,      Neuro/Psych    GI/Hepatic   Endo/Other    Renal/GU      Musculoskeletal   Abdominal   Peds  Hematology   Anesthesia Other Findings   Reproductive/Obstetrics                             Anesthesia Physical Anesthesia Plan  ASA:   Anesthesia Plan:    Post-op Pain Management:    Induction:   Airway Management Planned:   Additional Equipment:   Intra-op Plan:   Post-operative Plan:   Informed Consent:   Plan Discussed with:   Anesthesia Plan Comments: (Case cancelled due to acute on chronic hyponatremia and acute renal failure of unknown origin. Hospitalist consulted. Mr. Verstraete will be admitted, started on antibiotics and optimized for surgery. )        Anesthesia Quick Evaluation

## 2016-12-31 NOTE — H&P (Signed)
[name] is an 56 y.o. male.    Chief Complaint:   Right hip acute infection  Procedure:  I&D of right THA with poly exchange  HPI: Pt is a 56 y.o. male complaining of right hip pain. Pain had continually increased since the beginning. Pain started on 12/27/2016 early in the morning times 2. He was having a lot of pain with movement of the hip and difficulty with bearing weight.  X-rays in the clinic show previous right THA revision. Pt has tried various conservative treatments which have failed to alleviate their symptoms. Various options are discussed with the patient. Risks, benefits and expectations were discussed with the patient. Patient understand the risks, benefits and expectations and wishes to proceed with surgery.    PCP: No PCP Per Patient  D/C Plans:       Home   Post-op Meds:       No Rx given   FYI:     ASA  Norco  PICC line   PMH: Past Medical History:  Diagnosis Date  . Arthritis   . History of blood transfusion   . Hypertension   . Pneumonia    hx of  . PONV (postoperative nausea and vomiting)     PSH: Past Surgical History:  Procedure Laterality Date  . ANTERIOR HIP REVISION Left 07/17/2015   Procedure: LEFT POSTERIOR  HIP REVISION;  Surgeon: Paralee Cancel, MD;  Location: [facility];  Service: Orthopedics;  Laterality: Left;  . BACK SURGERY     four back surgeries (3 fusions)  . JOINT REPLACEMENT     3 surgeries on right hip, 1 on left hip  . right knee surgery - torn maniscus    . TONSILLECTOMY     adnoids removed  . TOTAL HIP REVISION Right 05/27/2016   Procedure: TOTAL HIP REVISION;  Surgeon: Paralee Cancel, MD;  Location: [facility];  Service: Orthopedics;  Laterality: Right;    Social History:  reports that he quit smoking about 12 years ago. He has never used smokeless tobacco. He reports that he drinks about 1.2 oz of alcohol per week . He reports that he does not use drugs.  Allergies:  Allergies  Allergen Reactions  . Ace Inhibitors Rash  .  Cephalexin Rash  . Doxycycline Rash    Medications: Current Facility-Administered Medications  Medication Dose Route Frequency Provider Last Rate Last Dose  . chlorhexidine (HIBICLENS) 4 % liquid 4 application  60 mL Topical Once Danae Orleans, PA-C      . lactated ringers infusion   Intravenous Continuous Effie Berkshire, MD 20 mL/hr at 12/31/16 1146      Results for orders placed or performed during the hospital encounter of 12/31/16 (from the past 48 hour(s))  CBC     Status: Abnormal   Collection Time: 12/31/16 11:36 AM  Result Value Ref Range   WBC 10.6 (H) 4.0 - 10.5 K/uL   RBC 3.34 (L) 4.22 - 5.81 MIL/uL   Hemoglobin 11.2 (L) 13.0 - 17.0 g/dL   HCT 30.8 (L) 39.0 - 52.0 %   MCV 92.2 78.0 - 100.0 fL   MCH 33.5 26.0 - 34.0 pg   MCHC 36.4 (H) 30.0 - 36.0 g/dL   RDW 12.9 11.5 - 15.5 %   Platelets 211 150 - 400 K/uL      Review of Systems  Constitutional: Negative.   HENT: Negative.   Eyes: Negative.   Respiratory: Negative.   Cardiovascular: Negative.   Gastrointestinal: Negative.  Genitourinary: Negative.   Musculoskeletal: Positive for joint pain.  Skin: Negative.   Neurological: Negative.   Endo/Heme/Allergies: Negative.   Psychiatric/Behavioral: Negative.        Physical Exam  Constitutional: He is oriented to person, place, and time. He appears well-developed.  HENT:  Head: Normocephalic.  Eyes: Pupils are equal, round, and reactive to light.  Neck: Neck supple. No JVD present. No tracheal deviation present. No thyromegaly present.  Cardiovascular: Normal rate, regular rhythm and intact distal pulses.   Respiratory: Effort normal and breath sounds normal. No respiratory distress. He has no wheezes.  GI: Soft. There is no tenderness. There is no guarding.  Musculoskeletal:       Right hip: He exhibits decreased range of motion, decreased strength, tenderness, bony tenderness, swelling and laceration (previous incision).  Lymphadenopathy:    He has no  cervical adenopathy.  Neurological: He is alert and oriented to person, place, and time.  Skin: Skin is warm and dry.  Psychiatric: He has a normal mood and affect.       Assessment/Plan Assessment:     Right hip acute infection   Plan: Patient will undergo a I&D of right THA with poly exchange on 12/31/2016 per Dr. Alvan Dame at [facility]. Risks benefits and expectations were discussed with the patient. Patient understand risks, benefits and expectations and wishes to proceed.   Christopher Pugh Davell Beckstead   PA-C  12/31/2016, 11:57 AM

## 2016-12-31 NOTE — Progress Notes (Signed)
Dr. Smith Robert aware of sodium 121.Marland Kitchen Pt will be followed by hospitalist doctor while in hospital.

## 2016-12-31 NOTE — Progress Notes (Signed)
Pharmacy Antibiotic Note  [name] is a 56 y.o. male admitted on 12/31/2016 with plans for I&D of right THA.  Upon arrival for surgery, abnormal Na & Scr labs noted and surgery postponed. Concern for sepsis.  Pharmacy has been consulted for Vancomycin and Cipro dosing.  Plan:  Vancomycin 2gm IV x 1 dose followed by 1250mg  IV q24h  Follow Scr daily  Vancomycin trough goal: 15-20 mcg/ml  Cipro 400mg  IV q12h  Flagyl per MD  F/U renal function, cultures/sensitivities  Height: 5\' 7"  (170.2 cm) Weight: 242 lb (109.8 kg) IBW/kg (Calculated) : 66.1  Temp (24hrs), Avg:98 F (36.7 C), Min:98 F (36.7 C), Max:98 F (36.7 C)   Recent Labs Lab 12/31/16 1136  WBC 10.6*  CREATININE 1.82*    Estimated Creatinine Clearance: 53 mL/min (A) (by C-G formula based on SCr of 1.82 mg/dL (H)).    Allergies  Allergen Reactions  . Ace Inhibitors Rash  . Cephalexin Rash  . Doxycycline Rash    Antimicrobials this admission: 3/20 vanc >>   3/20 cipro >>   3/20 flagyl >>  Dose adjustments this admission:    Microbiology results: 3/20 BCx: sent  Thank you for allowing pharmacy to be a part of this patient's care.  Everette Rank, PharmD 12/31/2016 3:59 PM

## 2016-12-31 NOTE — Progress Notes (Signed)
Patient ID: [name], male   DOB: 12-Sep-1961, 56 y.o.   MRN: [id number]    Case cancelled/postponed until we can improve his renal function and at least partially resolve the hyponatremia  Medical service consulted for guidance Despite need for antibiotic free assessment of infection I believe his medical picture is worrisome for near sepsis so I have requested he be started on broad spectrum antibiotics  If condition/labs improve by tomorrow I will plan to take him to OR tomorrow pm for I&D, decompression, and modular component exchange

## 2016-12-31 NOTE — Progress Notes (Signed)
Pt admitted to 1416 from PACU. Surgery canceled today. Pt with Right Hip Incision from August surgery that is intact with no open areas noted. Area surronding is noted to have redness and warmth and is tender.

## 2017-01-01 ENCOUNTER — Encounter (HOSPITAL_COMMUNITY): Payer: Self-pay | Admitting: Infectious Diseases

## 2017-01-01 ENCOUNTER — Inpatient Hospital Stay (HOSPITAL_COMMUNITY): Payer: Worker's Compensation | Admitting: Anesthesiology

## 2017-01-01 ENCOUNTER — Encounter (HOSPITAL_COMMUNITY)
Admission: RE | Disposition: A | Discharge status: Home Health | Payer: Self-pay | Source: Ambulatory Visit | Attending: Orthopedic Surgery

## 2017-01-01 ENCOUNTER — Inpatient Hospital Stay (HOSPITAL_COMMUNITY): Payer: Worker's Compensation

## 2017-01-01 DIAGNOSIS — I10 Essential (primary) hypertension: Secondary | ICD-10-CM

## 2017-01-01 DIAGNOSIS — Z881 Allergy status to other antibiotic agents status: Secondary | ICD-10-CM

## 2017-01-01 DIAGNOSIS — Z87891 Personal history of nicotine dependence: Secondary | ICD-10-CM

## 2017-01-01 DIAGNOSIS — E871 Hypo-osmolality and hyponatremia: Secondary | ICD-10-CM

## 2017-01-01 DIAGNOSIS — B9561 Methicillin susceptible Staphylococcus aureus infection as the cause of diseases classified elsewhere: Secondary | ICD-10-CM

## 2017-01-01 DIAGNOSIS — Z888 Allergy status to other drugs, medicaments and biological substances status: Secondary | ICD-10-CM

## 2017-01-01 DIAGNOSIS — E861 Hypovolemia: Secondary | ICD-10-CM

## 2017-01-01 DIAGNOSIS — Z836 Family history of other diseases of the respiratory system: Secondary | ICD-10-CM

## 2017-01-01 DIAGNOSIS — Z820 Family history of epilepsy and other diseases of the nervous system: Secondary | ICD-10-CM

## 2017-01-01 DIAGNOSIS — Z96649 Presence of unspecified artificial hip joint: Secondary | ICD-10-CM

## 2017-01-01 DIAGNOSIS — N179 Acute kidney failure, unspecified: Secondary | ICD-10-CM

## 2017-01-01 DIAGNOSIS — N289 Disorder of kidney and ureter, unspecified: Secondary | ICD-10-CM

## 2017-01-01 DIAGNOSIS — T8451XA Infection and inflammatory reaction due to internal right hip prosthesis, initial encounter: Principal | ICD-10-CM

## 2017-01-01 DIAGNOSIS — Y792 Prosthetic and other implants, materials and accessory orthopedic devices associated with adverse incidents: Secondary | ICD-10-CM

## 2017-01-01 DIAGNOSIS — M8618 Other acute osteomyelitis, other site: Secondary | ICD-10-CM

## 2017-01-01 DIAGNOSIS — M00051 Staphylococcal arthritis, right hip: Secondary | ICD-10-CM

## 2017-01-01 HISTORY — PX: INCISION AND DRAINAGE HIP: SHX1801

## 2017-01-01 LAB — CBC
HCT: 28.2 % — ABNORMAL LOW (ref 39.0–52.0)
Hemoglobin: 10.2 g/dL — ABNORMAL LOW (ref 13.0–17.0)
MCH: 33.8 pg (ref 26.0–34.0)
MCHC: 36.2 g/dL — ABNORMAL HIGH (ref 30.0–36.0)
MCV: 93.4 fL (ref 78.0–100.0)
Platelets: 199 10*3/uL (ref 150–400)
RBC: 3.02 MIL/uL — ABNORMAL LOW (ref 4.22–5.81)
RDW: 13.3 % (ref 11.5–15.5)
WBC: 10.6 10*3/uL — ABNORMAL HIGH (ref 4.0–10.5)

## 2017-01-01 LAB — BLOOD CULTURE ID PANEL (REFLEXED)

## 2017-01-01 LAB — BASIC METABOLIC PANEL
ANION GAP: 10 (ref 5–15)
BUN: 31 mg/dL — ABNORMAL HIGH (ref 6–20)
CALCIUM: 8.5 mg/dL — AB (ref 8.9–10.3)
CHLORIDE: 98 mmol/L — AB (ref 101–111)
CO2: 19 mmol/L — AB (ref 22–32)
Creatinine, Ser: 1.58 mg/dL — ABNORMAL HIGH (ref 0.61–1.24)
GFR calc Af Amer: 54 mL/min — ABNORMAL LOW (ref >60)
GFR calc non Af Amer: 47 mL/min — ABNORMAL LOW (ref >60)
GLUCOSE: 110 mg/dL — AB (ref 65–99)
Potassium: 3.9 mmol/L (ref 3.5–5.1)
Sodium: 127 mmol/L — ABNORMAL LOW (ref 135–145)

## 2017-01-01 LAB — CK: CK TOTAL: 479 U/L — AB (ref 49–397)

## 2017-01-01 LAB — PREPARE RBC (CROSSMATCH)

## 2017-01-01 LAB — SURGICAL PCR SCREEN
MRSA, PCR: NEGATIVE
STAPHYLOCOCCUS AUREUS: POSITIVE — AB

## 2017-01-01 LAB — SEDIMENTATION RATE: Sed Rate: 118 mm/hr — ABNORMAL HIGH (ref 0–16)

## 2017-01-01 LAB — C-REACTIVE PROTEIN: CRP: 29.7 mg/dL — ABNORMAL HIGH (ref <1.0)

## 2017-01-01 SURGERY — IRRIGATION AND DEBRIDEMENT HIP WITH POLY EXCHANGE
Anesthesia: General | Site: Hip | Laterality: Right

## 2017-01-01 MED ORDER — PHENYLEPHRINE HCL 10 MG/ML IJ SOLN
INTRAVENOUS | Status: DC | PRN
  Administered 2017-01-01: 50 ug/min via INTRAVENOUS

## 2017-01-01 MED ORDER — METOCLOPRAMIDE HCL 5 MG PO TABS: 5.0000 mg | ORAL_TABLET | Freq: Three times a day (TID) | ORAL | Status: DC | PRN

## 2017-01-01 MED ORDER — PHENYLEPHRINE 40 MCG/ML (10ML) SYRINGE FOR IV PUSH (FOR BLOOD PRESSURE SUPPORT)
PREFILLED_SYRINGE | INTRAVENOUS | Status: DC | PRN
  Administered 2017-01-01 (×2): 80 ug via INTRAVENOUS
  Administered 2017-01-01: 40 ug via INTRAVENOUS
  Administered 2017-01-01: 80 ug via INTRAVENOUS
  Administered 2017-01-01: 40 ug via INTRAVENOUS
  Administered 2017-01-01 (×2): 80 ug via INTRAVENOUS

## 2017-01-01 MED ORDER — FERROUS SULFATE 325 (65 FE) MG PO TABS
325.0000 mg | ORAL_TABLET | Freq: Three times a day (TID) | ORAL | Status: DC
  Administered 2017-01-02 – 2017-01-09 (×11): 325 mg via ORAL
  Filled 2017-01-01 (×12): qty 1

## 2017-01-01 MED ORDER — SODIUM CHLORIDE 0.9 % IR SOLN
Status: DC | PRN
Start: 2017-01-01 — End: 2017-01-01
  Administered 2017-01-01: 9000 mL

## 2017-01-01 MED ORDER — ALBUMIN HUMAN 5 % IV SOLN
INTRAVENOUS | Status: AC
  Filled 2017-01-01: qty 250

## 2017-01-01 MED ORDER — SODIUM CHLORIDE 0.9 % IV SOLN
800.0000 mg | INTRAVENOUS | Status: DC
  Administered 2017-01-02 – 2017-01-09 (×8): 800 mg via INTRAVENOUS
  Filled 2017-01-01 (×9): qty 16

## 2017-01-01 MED ORDER — HYDROCODONE-ACETAMINOPHEN 5-325 MG PO TABS
1.0000 | ORAL_TABLET | ORAL | Status: DC | PRN
  Administered 2017-01-02 – 2017-01-09 (×29): 2 via ORAL
  Filled 2017-01-01 (×29): qty 2

## 2017-01-01 MED ORDER — ONDANSETRON HCL 4 MG/2ML IJ SOLN: 4.0000 mg | Freq: Once | INTRAMUSCULAR | Status: DC | PRN

## 2017-01-01 MED ORDER — ENOXAPARIN SODIUM 60 MG/0.6ML ~~LOC~~ SOLN
55.0000 mg | SUBCUTANEOUS | Status: DC
  Administered 2017-01-02 – 2017-01-05 (×4): 55 mg via SUBCUTANEOUS
  Filled 2017-01-01 (×4): qty 0.6

## 2017-01-01 MED ORDER — EPHEDRINE 5 MG/ML INJ
INTRAVENOUS | Status: AC
  Filled 2017-01-01: qty 20

## 2017-01-01 MED ORDER — HYDROMORPHONE HCL 1 MG/ML IJ SOLN
0.2500 mg | INTRAMUSCULAR | Status: DC | PRN
  Administered 2017-01-01 (×3): 0.5 mg via INTRAVENOUS

## 2017-01-01 MED ORDER — METHOCARBAMOL 500 MG PO TABS
500.0000 mg | ORAL_TABLET | Freq: Four times a day (QID) | ORAL | Status: DC | PRN
  Administered 2017-01-07 – 2017-01-09 (×3): 500 mg via ORAL
  Filled 2017-01-01 (×3): qty 1

## 2017-01-01 MED ORDER — SODIUM CHLORIDE 0.9 % IV SOLN
Freq: Once | INTRAVENOUS | Status: AC
  Administered 2017-01-07: 11:00:00 via INTRAVENOUS

## 2017-01-01 MED ORDER — ONDANSETRON HCL 4 MG/2ML IJ SOLN
INTRAMUSCULAR | Status: DC | PRN
  Administered 2017-01-01: 4 mg via INTRAVENOUS

## 2017-01-01 MED ORDER — SUCCINYLCHOLINE CHLORIDE 200 MG/10ML IV SOSY
PREFILLED_SYRINGE | INTRAVENOUS | Status: DC | PRN
  Administered 2017-01-01: 160 mg via INTRAVENOUS

## 2017-01-01 MED ORDER — HYDROMORPHONE HCL 2 MG/ML IJ SOLN
INTRAMUSCULAR | Status: AC
  Administered 2017-01-01: 0.5 mg
  Filled 2017-01-01: qty 1

## 2017-01-01 MED ORDER — EPHEDRINE SULFATE-NACL 50-0.9 MG/10ML-% IV SOSY
PREFILLED_SYRINGE | INTRAVENOUS | Status: DC | PRN
  Administered 2017-01-01 (×6): 10 mg via INTRAVENOUS

## 2017-01-01 MED ORDER — PROPOFOL 10 MG/ML IV BOLUS
INTRAVENOUS | Status: AC
  Filled 2017-01-01: qty 20

## 2017-01-01 MED ORDER — MIDAZOLAM HCL 5 MG/5ML IJ SOLN
INTRAMUSCULAR | Status: DC | PRN
  Administered 2017-01-01: 2 mg via INTRAVENOUS

## 2017-01-01 MED ORDER — PHENYLEPHRINE 40 MCG/ML (10ML) SYRINGE FOR IV PUSH (FOR BLOOD PRESSURE SUPPORT)
PREFILLED_SYRINGE | INTRAVENOUS | Status: AC
  Filled 2017-01-01: qty 20

## 2017-01-01 MED ORDER — SUGAMMADEX SODIUM 200 MG/2ML IV SOLN
INTRAVENOUS | Status: DC | PRN
  Administered 2017-01-01: 200 mg via INTRAVENOUS

## 2017-01-01 MED ORDER — BISACODYL 10 MG RE SUPP: 10.0000 mg | Freq: Every day | RECTAL | Status: DC | PRN

## 2017-01-01 MED ORDER — SODIUM CHLORIDE 0.9 % IR SOLN
Status: DC | PRN
  Administered 2017-01-01: 1000 mL

## 2017-01-01 MED ORDER — MIDAZOLAM HCL 2 MG/2ML IJ SOLN
INTRAMUSCULAR | Status: AC
  Filled 2017-01-01: qty 2

## 2017-01-01 MED ORDER — MENTHOL 3 MG MT LOZG: 1.0000 | LOZENGE | OROMUCOSAL | Status: DC | PRN

## 2017-01-01 MED ORDER — METHOCARBAMOL 1000 MG/10ML IJ SOLN
500.0000 mg | Freq: Four times a day (QID) | INTRAMUSCULAR | Status: DC | PRN
  Filled 2017-01-01 (×2): qty 5

## 2017-01-01 MED ORDER — DEXAMETHASONE SODIUM PHOSPHATE 10 MG/ML IJ SOLN
10.0000 mg | Freq: Once | INTRAMUSCULAR | Status: AC
  Administered 2017-01-02: 10 mg via INTRAVENOUS
  Filled 2017-01-01: qty 1

## 2017-01-01 MED ORDER — ROCURONIUM BROMIDE 10 MG/ML (PF) SYRINGE
PREFILLED_SYRINGE | INTRAVENOUS | Status: DC | PRN
  Administered 2017-01-01: 40 mg via INTRAVENOUS

## 2017-01-01 MED ORDER — FENTANYL CITRATE (PF) 100 MCG/2ML IJ SOLN
INTRAMUSCULAR | Status: DC | PRN
  Administered 2017-01-01: 50 ug via INTRAVENOUS
  Administered 2017-01-01: 100 ug via INTRAVENOUS

## 2017-01-01 MED ORDER — PHENOL 1.4 % MT LIQD: 1.0000 | OROMUCOSAL | Status: DC | PRN

## 2017-01-01 MED ORDER — VANCOMYCIN HCL 1000 MG IV SOLR
INTRAVENOUS | Status: AC
  Filled 2017-01-01: qty 1000

## 2017-01-01 MED ORDER — FENTANYL CITRATE (PF) 250 MCG/5ML IJ SOLN
INTRAMUSCULAR | Status: AC
  Filled 2017-01-01: qty 5

## 2017-01-01 MED ORDER — MORPHINE SULFATE (PF) 4 MG/ML IV SOLN
2.0000 mg | INTRAVENOUS | Status: DC | PRN
  Administered 2017-01-01 – 2017-01-08 (×3): 2 mg via INTRAVENOUS
  Filled 2017-01-01 (×3): qty 1

## 2017-01-01 MED ORDER — ALUM & MAG HYDROXIDE-SIMETH 200-200-20 MG/5ML PO SUSP
30.0000 mL | ORAL | Status: DC | PRN
  Administered 2017-01-01: 30 mL via ORAL
  Filled 2017-01-01: qty 30

## 2017-01-01 MED ORDER — DIPHENHYDRAMINE HCL 25 MG PO CAPS: 25.0000 mg | ORAL_CAPSULE | Freq: Four times a day (QID) | ORAL | Status: DC | PRN

## 2017-01-01 MED ORDER — CELECOXIB 200 MG PO CAPS
200.0000 mg | ORAL_CAPSULE | Freq: Two times a day (BID) | ORAL | Status: DC
  Administered 2017-01-01 – 2017-01-09 (×13): 200 mg via ORAL
  Filled 2017-01-01 (×14): qty 1

## 2017-01-01 MED ORDER — ALBUMIN HUMAN 5 % IV SOLN
INTRAVENOUS | Status: DC | PRN
  Administered 2017-01-01: 19:00:00 via INTRAVENOUS

## 2017-01-01 MED ORDER — METOCLOPRAMIDE HCL 5 MG/ML IJ SOLN: 5.0000 mg | Freq: Three times a day (TID) | INTRAMUSCULAR | Status: DC | PRN

## 2017-01-01 MED ORDER — MEPERIDINE HCL 50 MG/ML IJ SOLN: 6.2500 mg | INTRAMUSCULAR | Status: DC | PRN

## 2017-01-01 MED ORDER — DOCUSATE SODIUM 100 MG PO CAPS
100.0000 mg | ORAL_CAPSULE | Freq: Two times a day (BID) | ORAL | Status: DC
  Administered 2017-01-01 – 2017-01-08 (×7): 100 mg via ORAL
  Filled 2017-01-01 (×14): qty 1

## 2017-01-01 MED ORDER — SUCCINYLCHOLINE CHLORIDE 200 MG/10ML IV SOSY
PREFILLED_SYRINGE | INTRAVENOUS | Status: AC
  Filled 2017-01-01: qty 10

## 2017-01-01 MED ORDER — ONDANSETRON HCL 4 MG/2ML IJ SOLN
INTRAMUSCULAR | Status: AC
  Filled 2017-01-01: qty 2

## 2017-01-01 MED ORDER — PROPOFOL 10 MG/ML IV BOLUS
INTRAVENOUS | Status: DC | PRN
  Administered 2017-01-01: 160 mg via INTRAVENOUS

## 2017-01-01 MED ORDER — LIDOCAINE 2% (20 MG/ML) 5 ML SYRINGE
INTRAMUSCULAR | Status: DC | PRN
  Administered 2017-01-01: 50 mg via INTRAVENOUS

## 2017-01-01 SURGICAL SUPPLY — 46 items
DRAPE INCISE IOBAN 85X60 (DRAPES) ×2 IMPLANT
DRAPE ORTHO SPLIT 77X108 STRL (DRAPES) ×2
DRAPE POUCH INSTRU U-SHP 10X18 (DRAPES) ×2 IMPLANT
DRAPE SURG 17X11 SM STRL (DRAPES) ×2 IMPLANT
DRAPE SURG ORHT 6 SPLT 77X108 (DRAPES) ×2 IMPLANT
DRAPE U-SHAPE 47X51 STRL (DRAPES) ×2 IMPLANT
DRSG MEPILEX BORDER 4X12 (GAUZE/BANDAGES/DRESSINGS) ×2 IMPLANT
DRSG MEPILEX BORDER 4X8 (GAUZE/BANDAGES/DRESSINGS) ×2 IMPLANT
DRSG PAD ABDOMINAL 8X10 ST (GAUZE/BANDAGES/DRESSINGS) IMPLANT
DURAPREP 26ML APPLICATOR (WOUND CARE) ×2 IMPLANT
ELECT BLADE TIP CTD 4 INCH (ELECTRODE) ×2 IMPLANT
ELECT REM PT RETURN 9FT ADLT (ELECTROSURGICAL) ×2
ELECTRODE REM PT RTRN 9FT ADLT (ELECTROSURGICAL) ×1 IMPLANT
EVACUATOR 1/8 PVC DRAIN (DRAIN) IMPLANT
FACESHIELD WRAPAROUND (MASK) ×4 IMPLANT
GAUZE SPONGE 4X4 12PLY STRL (GAUZE/BANDAGES/DRESSINGS) IMPLANT
GLOVE BIOGEL PI IND STRL 7.5 (GLOVE) ×1 IMPLANT
GLOVE BIOGEL PI IND STRL 8.5 (GLOVE) ×1 IMPLANT
GLOVE BIOGEL PI INDICATOR 7.5 (GLOVE) ×1
GLOVE BIOGEL PI INDICATOR 8.5 (GLOVE) ×1
GLOVE ECLIPSE 8.0 STRL XLNG CF (GLOVE) ×4 IMPLANT
GLOVE ORTHO TXT STRL SZ7.5 (GLOVE) ×4 IMPLANT
GOWN STRL REUS W/TWL LRG LVL3 (GOWN DISPOSABLE) ×2 IMPLANT
GOWN STRL REUS W/TWL XL LVL3 (GOWN DISPOSABLE) ×2 IMPLANT
HEAD FEM DLT TS CER 36X+5.0 (Head) ×2 IMPLANT
IMMOBILIZER KNEE 20 (SOFTGOODS)
IMMOBILIZER KNEE 20 THIGH 36 (SOFTGOODS) IMPLANT
KIT BASIN OR (CUSTOM PROCEDURE TRAY) ×2 IMPLANT
KIT STIMULAN RAPID CURE  10CC (Orthopedic Implant) ×1 IMPLANT
KIT STIMULAN RAPID CURE 10CC (Orthopedic Implant) ×1 IMPLANT
LINER NEUTRAL 64X36 P4 (Hips) ×2 IMPLANT
MANIFOLD NEPTUNE II (INSTRUMENTS) ×2 IMPLANT
NS IRRIG 1000ML POUR BTL (IV SOLUTION) ×2 IMPLANT
PACK TOTAL JOINT (CUSTOM PROCEDURE TRAY) ×2 IMPLANT
POSITIONER SURGICAL ARM (MISCELLANEOUS) ×2 IMPLANT
SPONGE LAP 18X18 X RAY DECT (DISPOSABLE) ×2 IMPLANT
STRIP CLOSURE SKIN 1/2X4 (GAUZE/BANDAGES/DRESSINGS) IMPLANT
SUT MNCRL AB 4-0 PS2 18 (SUTURE) IMPLANT
SUT PDS AB 1 CT1 27 (SUTURE) ×8 IMPLANT
SUT VIC AB 1 CT1 36 (SUTURE) ×4 IMPLANT
SUT VIC AB 2-0 CT1 27 (SUTURE) ×3
SUT VIC AB 2-0 CT1 TAPERPNT 27 (SUTURE) ×3 IMPLANT
SWAB COLLECTION DEVICE MRSA (MISCELLANEOUS) ×2 IMPLANT
SWAB CULTURE ESWAB REG 1ML (MISCELLANEOUS) ×2 IMPLANT
TOWEL OR 17X26 10 PK STRL BLUE (TOWEL DISPOSABLE) ×4 IMPLANT
TRAY FOLEY W/METER SILVER 16FR (SET/KITS/TRAYS/PACK) ×2 IMPLANT

## 2017-01-01 NOTE — Care Management Note (Signed)
Case Management Note  Patient Details  Name: [name] MRN: [id number] Date of Birth: [birth year]  Subjective/Objective:55 y/o m admitted w/Acute hyponatremia. For I&d hip today. From home.                    Action/Plan:d/c plan home.   Expected Discharge Date:                  Expected Discharge Plan:  Home/Self Care  In-House Referral:     Discharge planning Services  CM Consult  Post Acute Care Choice:    Choice offered to:     DME Arranged:    DME Agency:     HH Arranged:    HH Agency:     Status of Service:  In process, will continue to follow  If discussed at Long Length of Stay Meetings, dates discussed:    Additional Comments:  Dessa Phi, RN 01/01/2017, 11:51 AM

## 2017-01-01 NOTE — Transfer of Care (Signed)
Immediate Anesthesia Transfer of Care Note  Patient: [name]  Procedure(s) Performed: Procedure(s): IRRIGATION AND DEBRIDEMENT RIGHT TOTAL HIP WITH HEAD AND LINER EXCHANGE AND PLACEMENT OF STIMULIN BEADS (Right)  Patient Location: PACU  Anesthesia Type:General  Level of Consciousness: awake and alert   Airway & Oxygen Therapy: Patient Spontanous Breathing and Patient connected to face mask oxygen  Post-op Assessment: Report given to RN and Post -op Vital signs reviewed and stable  Post vital signs: Reviewed and stable  Last Vitals:  Vitals:   01/01/17 1514 01/01/17 2031  BP: (!) 107/56 (!) 103/58  Pulse: 90   Resp: 18   Temp: 36.9 C 36.8 C    Last Pain:  Vitals:   01/01/17 1514  TempSrc: Oral  PainSc:       Patients Stated Pain Goal: 4 (12/12/29 4388)  Complications: No apparent anesthesia complications

## 2017-01-01 NOTE — Brief Op Note (Signed)
12/31/2016 - 01/01/2017  8:04 PM  PATIENT:  [name]  56 y.o. male  PRE-OPERATIVE DIAGNOSIS:  infected right total hip replacement  POST-OPERATIVE DIAGNOSIS:  infected right total hip replacement  PROCEDURE:  Procedure(s): IRRIGATION AND DEBRIDEMENT RIGHT TOTAL HIP WITH HEAD AND LINER EXCHANGE AND PLACEMENT OF STIMULIN BEADS (Right)  SURGEON:  Surgeon(s) and Role:    * Paralee Cancel, MD - Primary  PHYSICIAN ASSISTANT: Danae Orleans, PA-C  ANESTHESIA:   general  EBL:  Total I/O In: 250 [IV Piggyback:250] Out: 1400 [Urine:100; Blood:1300]  BLOOD ADMINISTERED:  Planning to give 2 units PRBCs  DRAINS: none   LOCAL MEDICATIONS USED:  NONE  SPECIMEN:  Source of Specimen:  right hip fluid  DISPOSITION OF SPECIMEN:  PATHOLOGY  COUNTS:  YES  TOURNIQUET:  * No tourniquets in log *  DICTATION: .Other Dictation: Dictation Number [id number]  PLAN OF CARE: Admit to inpatient   PATIENT DISPOSITION:  PACU - hemodynamically stable.   Delay start of Pharmacological VTE agent (>24hrs) due to surgical blood loss or risk of bleeding: no

## 2017-01-01 NOTE — Anesthesia Preprocedure Evaluation (Signed)
Anesthesia Evaluation  Patient identified by MRN, date of birth, ID band Patient awake    Reviewed: Allergy & Precautions, NPO status , Patient's Chart, lab work & pertinent test results  History of Anesthesia Complications (+) PONV  Airway Mallampati: I  TM Distance: >3 FB Neck ROM: Full    Dental   Pulmonary former smoker,    Pulmonary exam normal        Cardiovascular hypertension, Pt. on medications Normal cardiovascular exam     Neuro/Psych    GI/Hepatic   Endo/Other    Renal/GU      Musculoskeletal   Abdominal   Peds  Hematology   Anesthesia Other Findings   Reproductive/Obstetrics                             Anesthesia Physical Anesthesia Plan  ASA: III  Anesthesia Plan: General   Post-op Pain Management:    Induction: Intravenous  Airway Management Planned: Oral ETT  Additional Equipment:   Intra-op Plan:   Post-operative Plan: Extubation in OR  Informed Consent: I have reviewed the patients History and Physical, chart, labs and discussed the procedure including the risks, benefits and alternatives for the proposed anesthesia with the patient or authorized representative who has indicated his/her understanding and acceptance.     Plan Discussed with: CRNA and Surgeon  Anesthesia Plan Comments:         Anesthesia Quick Evaluation

## 2017-01-01 NOTE — Interval H&P Note (Signed)
History and Physical Interval Note:  01/01/2017 6:05 PM  [name]  has presented today for surgery, with the diagnosis of infected right hip  The various methods of treatment have been discussed with the patient and family. After consideration of risks, benefits and other options for treatment, the patient has consented to  Procedure(s): Lawrence (Right) as a surgical intervention .  The patient's history has been reviewed, patient examined, no change in status, stable for surgery.  I have reviewed the patient's chart and labs.  Questions were answered to the patient's satisfaction.     [name]

## 2017-01-01 NOTE — Progress Notes (Signed)
PROGRESS NOTE    [name]  OQH:[id number] DOB: [birth year] DOA: 12/31/2016 PCP: No PCP Per Patient   No chief complaint on file.   Brief Narrative:  Consult for AKI and hyponatremia Assessment & Plan   Acute kidney injury -Baseline hemoglobin appears to be 0.7, creatinine peaked to 1.8 to -Currently creatinine 1.58 -Patient was noted to be on Diovan, which as been held -AKI possibly secondary to medications versus infections versus hypovolemia -Continue IV fluids, monitor intake and output -Renal ultrasound within normal limits -Continue to monitor BMP  Right Hip infection; hardware, -Per primary, orthopedics, plan for I&D today -Currently on Cipro, Flagyl, vancomycin per pharmacy -Blood cultures from 12/31/2016 showed 1/2 GPC in clusters  Hyponatremia -Patient has had mild hyponatremia since 2011, as low as 127. His baseline appears to be approximately 129-133 -Placed on IV fluids, currently sodium is improving to 127 today -Continue to monitor  Essential hypertension -Continue Coreg with holding parameters -Norvasc and Diovan held  Constipation -Continue MiraLAX and senna  DVT Prophylaxis  Lovenox  Code Status: Full  Family Communication: None at bedside  Disposition Plan: Per primary  Consultants TRH consulted  Procedures  Renal US  Antibiotics   Anti-infectives    Start     Dose/Rate Route Frequency Ordered Stop   01/01/17 2100  vancomycin (VANCOCIN) 1,250 mg in sodium chloride 0.9 % 250 mL IVPB     1,250 mg 166.7 mL/hr over 90 Minutes Intravenous Every 24 hours 12/31/16 2020     01/01/17 0600  ciprofloxacin (CIPRO) IVPB 400 mg     400 mg 200 mL/hr over 60 Minutes Intravenous Every 12 hours 12/31/16 1806     12/31/16 1700  metroNIDAZOLE (FLAGYL) IVPB 500 mg     500 mg 100 mL/hr over 60 Minutes Intravenous Every 8 hours 12/31/16 1551     12/31/16 1600  ciprofloxacin (CIPRO) IVPB 400 mg     400 mg 200 mL/hr over 60 Minutes Intravenous STAT  12/31/16 1558 12/31/16 1901   12/31/16 1600  vancomycin (VANCOCIN) 2,000 mg in sodium chloride 0.9 % 500 mL IVPB     2,000 mg 250 mL/hr over 120 Minutes Intravenous STAT 12/31/16 1559 01/01/17 0004   12/31/16 1415  vancomycin (VANCOCIN) 1,000 mg in sodium chloride 0.9 % 250 mL IVPB  Status:  Discontinued     1,000 mg 250 mL/hr over 60 Minutes Intravenous  Once 12/31/16 1407 12/31/16 1408   12/31/16 1408  vancomycin (VANCOCIN) IVPB 1000 mg/200 mL premix  Status:  Discontinued     1,000 mg 200 mL/hr over 60 Minutes Intravenous 60 min pre-op 12/31/16 1408 12/31/16 1536   12/31/16 1408  vancomycin (VANCOCIN) 1-5 GM/200ML-% IVPB  Status:  Discontinued    Comments:  [name], [name]  : cabinet override      12/31/16 1408 12/31/16 1558      Subjective:   [name] seen and examined today.  Patient complains of mainly right hip pain. Wondering if he will have his surgery this afternoon. Denies current chest pain, shortness of breath, abdominal pain, nausea or vomiting.  Objective:   Vitals:   12/31/16 1718 12/31/16 1727 12/31/16 2110 01/01/17 0528  BP: 105/69 (!) 105/59 (!) 107/55 (!) 116/57  Pulse: (!) 107 (!) 107 98 85  Resp: 18  18 17   Temp: 98 F (36.7 C) 98 F (36.7 C) 99.6 F (37.6 C) 98.4 F (36.9 C)  TempSrc: Oral Oral Oral Oral  SpO2: 99% 99% 97% 100%  Weight: 109.7 kg (241  lb 13.5 oz) 109.3 kg (241 lb)  110.2 kg (242 lb 15.2 oz)  Height: 5\' 7"  (1.702 m) 5\' 7"  (1.702 m)      Intake/Output Summary (Last 24 hours) at 01/01/17 1217 Last data filed at 01/01/17 0600  Gross per 24 hour  Intake          1793.33 ml  Output             1900 ml  Net          -106.67 ml   Filed Weights   12/31/16 1718 12/31/16 1727 01/01/17 0528  Weight: 109.7 kg (241 lb 13.5 oz) 109.3 kg (241 lb) 110.2 kg (242 lb 15.2 oz)    Exam  General: Well developed, well nourished, NAD, appears stated age  HEENT: NCAT, mucous membranes moist.   Cardiovascular: S1 S2 auscultated, no rubs,  murmurs or gallops. Regular rate and rhythm.  Respiratory: Clear to auscultation bilaterally with equal chest rise  Abdomen: Soft, nontender, nondistended, + bowel sounds  Extremities: warm dry without cyanosis clubbing or edema  Neuro: AAOx3, Nonfocal   Skin: Right hip edema, mild erythema  Psych: Normal affect and demeanor    Data Reviewed: I have personally reviewed following labs and imaging studies  CBC:  Recent Labs Lab 12/31/16 1136 01/01/17 0511  WBC 10.6* 10.6*  HGB 11.2* 10.2*  HCT 30.8* 28.2*  MCV 92.2 93.4  PLT 211 150   Basic Metabolic Panel:  Recent Labs Lab 12/31/16 1136 12/31/16 1510 01/01/17 0511  NA 121* 122* 127*  K 4.1 4.0 3.9  CL 90* 91* 98*  CO2 19* 20* 19*  GLUCOSE 123* 111* 110*  BUN 33* 31* 31*  CREATININE 1.82* 1.62* 1.58*  CALCIUM 9.0 9.2 8.5*   GFR: Estimated Creatinine Clearance: 61.1 mL/min (A) (by C-G formula based on SCr of 1.58 mg/dL (H)). Liver Function Tests:  Recent Labs Lab 12/31/16 1136  AST 44*  ALT 28  ALKPHOS 56  BILITOT 1.0  PROT 7.1  ALBUMIN 3.3*   No results for input(s): LIPASE, AMYLASE in the last 168 hours. No results for input(s): AMMONIA in the last 168 hours. Coagulation Profile: No results for input(s): INR, PROTIME in the last 168 hours. Cardiac Enzymes: No results for input(s): CKTOTAL, CKMB, CKMBINDEX, TROPONINI in the last 168 hours. BNP (last 3 results) No results for input(s): PROBNP in the last 8760 hours. HbA1C: No results for input(s): HGBA1C in the last 72 hours. CBG: No results for input(s): GLUCAP in the last 168 hours. Lipid Profile: No results for input(s): CHOL, HDL, LDLCALC, TRIG, CHOLHDL, LDLDIRECT in the last 72 hours. Thyroid Function Tests: No results for input(s): TSH, T4TOTAL, FREET4, T3FREE, THYROIDAB in the last 72 hours. Anemia Panel: No results for input(s): VITAMINB12, FOLATE, FERRITIN, TIBC, IRON, RETICCTPCT in the last 72 hours. Urine analysis:    Component  Value Date/Time   COLORURINE YELLOW 07/10/2015 1030   APPEARANCEUR CLEAR 07/10/2015 1030   LABSPEC 1.012 07/10/2015 1030   PHURINE 6.0 07/10/2015 1030   GLUCOSEU NEGATIVE 07/10/2015 1030   HGBUR NEGATIVE 07/10/2015 1030   BILIRUBINUR NEGATIVE 07/10/2015 1030   KETONESUR NEGATIVE 07/10/2015 1030   PROTEINUR NEGATIVE 07/10/2015 1030   UROBILINOGEN 0.2 07/10/2015 1030   NITRITE NEGATIVE 07/10/2015 1030   LEUKOCYTESUR NEGATIVE 07/10/2015 1030   Sepsis Labs: @LABRCNTIP (procalcitonin:4,lacticidven:4)  ) Recent Results (from the past 240 hour(s))  Culture, blood (routine x 2)     Status: None (Preliminary result)   Collection Time: 12/31/16  5:35  PM  Result Value Ref Range Status   Specimen Description BLOOD BLOOD RIGHT ARM  Final   Special Requests BOTTLES DRAWN AEROBIC AND ANAEROBIC 7 CC EA  Final   Culture  Setup Time   Final    GRAM POSITIVE COCCI IN CLUSTERS IN BOTH AEROBIC AND ANAEROBIC BOTTLES Organism ID to follow Performed at [facility], [place] [address] [place], [place] [address]    Culture GRAM POSITIVE COCCI  Final   Report Status PENDING  Incomplete  Culture, blood (routine x 2)     Status: None (Preliminary result)   Collection Time: 12/31/16  5:45 PM  Result Value Ref Range Status   Specimen Description BLOOD BLOOD RIGHT ARM  Final   Special Requests BOTTLES DRAWN AEROBIC AND ANAEROBIC 5.5 CC EA  Final   Culture   Final    NO GROWTH < 24 HOURS Performed at [facility], [place] [address] [place], [place] [address]    Report Status PENDING  Incomplete      Radiology Studies: US Renal  Result Date: 01/01/2017 CLINICAL DATA:  Acute kidney injury EXAM: RENAL / URINARY TRACT ULTRASOUND COMPLETE COMPARISON:  None. FINDINGS: Right Kidney: Length: 13.6 cm. Echogenicity within normal limits. No mass or hydronephrosis visualized. Left Kidney: Length: 14.2 cm. Echogenicity within normal limits. No mass or hydronephrosis visualized. Bladder: Appears normal for  degree of bladder distention. IMPRESSION: Within normal limits. Electronically Signed   By: Marybelle Killings M.D.   On: 01/01/2017 08:56   Dg Chest Port 1 View  Result Date: 12/31/2016 CLINICAL DATA:  Preop. EXAM: PORTABLE CHEST 1 VIEW COMPARISON:  05/08/2010. FINDINGS: Low lung volumes. Normal cardiomediastinal silhouette. No active infiltrates or failure. No effusion or pneumothorax. IMPRESSION: Low lung volumes, otherwise unremarkable chest. Electronically Signed   By: Staci Righter M.D.   On: 12/31/2016 15:53     Scheduled Meds: . carvedilol  12.5 mg Oral BID  . chlorhexidine  60 mL Topical Once  . ciprofloxacin  400 mg Intravenous Q12H  . enoxaparin (LOVENOX) injection  40 mg Subcutaneous Q24H  . metronidazole  500 mg Intravenous Q8H  . multivitamin with minerals  1 tablet Oral Daily  . polyethylene glycol  17 g Oral BID  . senna  1 tablet Oral BID  . vancomycin  1,250 mg Intravenous Q24H  . [START ON 01/04/2017] Vitamin D (Ergocalciferol)  50,000 Units Oral Q Sat   Continuous Infusions: . sodium chloride 100 mL/hr at 01/01/17 0703     LOS: 1 day   Time Spent in minutes   30 minutes  Aravind Chrismer D.O. on 01/01/2017 at 12:17 PM  Between 7am to 7pm - Pager - [phone]  After 7pm go to [email or web address] - password [email or web address]  And look for the night coverage person covering for me after hours  [facility] Office  [phone]

## 2017-01-01 NOTE — Progress Notes (Signed)
     Subjective: 1 Day Post-Op Procedure(s) (LRB): IRRIGATION AND DEBRIDEMENT HIP WITH POLY EXCHANGE POSTERIOR (Right)   Patient reports pain as moderate - severe.  The pain has increased significantly over the last 4 days.  Discussed that his labs have improved since yesterday.  He is discouraged with the hip as is was feeling so much better after the last surgery until 4 days ago.  Looking forward to having surgery later today to help fix the problem.   Objective:   VITALS:   Vitals:   12/31/16 2110 01/01/17 0528  BP: (!) 107/55 (!) 116/57  Pulse: 98 85  Resp: 18 17  Temp: 99.6 F (37.6 C) 98.4 F (36.9 C)    Dorsiflexion/Plantar flexion intact  Swelling of the right thigh Blistering over the previous incision site.   LABS  Recent Labs  12/31/16 1136 01/01/17 0511  HGB 11.2* 10.2*  HCT 30.8* 28.2*  WBC 10.6* 10.6*  PLT 211 199     Recent Labs  12/31/16 1136 12/31/16 1510 01/01/17 0511  NA 121* 122* 127*  K 4.1 4.0 3.9  BUN 33* 31* 31*  CREATININE 1.82* 1.62* 1.58*  GLUCOSE 123* 111* 110*     Assessment/Plan: 1 Day Post-Op Procedure(s) (LRB): IRRIGATION AND DEBRIDEMENT HIP WITH POLY EXCHANGE POSTERIOR (Right) NPO now Plan for and I&D later today per Dr. Alvan Dame. Discussed with the patient and he is ready to proceed.   [name]   PAC  01/01/2017, 10:22 AM

## 2017-01-01 NOTE — Consult Note (Signed)
[facility]  Date of Admission:  12/31/2016  Date of Consult:  01/01/2017  Reason for Consult: MSSA bacteremia Referring Physician: CHAMP  Impression/Recommendation MSSA bacteremia Will change to daptomycin Check CK Will repeat BCx Will check TTE Would consider PIC when his repeat BCx are (-)  R THR infection, osteomyelitis await poly-exchange Or complete resection with spacer.   HTN  HypoNa Suspect hypovolemia.  Could check Una, Uosm ect Na improving. Cr not improving. Would consider renal eval in AM if Cr remains elevated.   ARF (cr 0.78 in August 2017) Renal u/s nl hypovolemia, ARB use  Comment- Hard pressed to call him septic with normal WBC and no fever. He has had some "soft BP" Anaerobes (and therefore flagyl use) would be very uncommon in this type of infection.  He has a significant keflex allergy prior with facial swelling when he had OSA surgery ~ 2001  Thank you so much for this interesting consult,   [name] (pager) [phone] [email or web address]  [name] is an 56 y.o. male.  HPI: 56 yo M with hx of prev R THR 05-2016 was seen by his orthopedist for worsening hip pain. He was adm on 3-20 for poly-exchange of his THR.  On admission he was found to have ARF and hyponatremia. He was eval by medicine and felt to have a weeping hip wound. He was felt to be "septic" and was started on vanco/cipro/flagyl.  He has been afebrile, his WBC has been normal.  He is now found to have MSSA bacteremia.    Past Medical History:  Diagnosis Date  . Arthritis   . History of blood transfusion   . Hypertension   . Pneumonia    hx of  . PONV (postoperative nausea and vomiting)     Past Surgical History:  Procedure Laterality Date  . ANTERIOR HIP REVISION Left 07/17/2015   Procedure: LEFT POSTERIOR  HIP REVISION;  Surgeon: Paralee Cancel, MD;  Location: [facility];  Service: Orthopedics;  Laterality: Left;  . BACK  SURGERY     four back surgeries (3 fusions)  . JOINT REPLACEMENT     3 surgeries on right hip, 1 on left hip  . right knee surgery - torn maniscus    . TONSILLECTOMY     adnoids removed  . TOTAL HIP REVISION Right 05/27/2016   Procedure: TOTAL HIP REVISION;  Surgeon: Paralee Cancel, MD;  Location: [facility];  Service: Orthopedics;  Laterality: Right;     Allergies  Allergen Reactions  . Ace Inhibitors Rash  . Cephalexin Rash  . Doxycycline Rash    Medications:  Scheduled: . carvedilol  12.5 mg Oral BID  . chlorhexidine  60 mL Topical Once  . ciprofloxacin  400 mg Intravenous Q12H  . [START ON 01/02/2017] enoxaparin (LOVENOX) injection  55 mg Subcutaneous Q24H  . metronidazole  500 mg Intravenous Q8H  . multivitamin with minerals  1 tablet Oral Daily  . polyethylene glycol  17 g Oral BID  . senna  1 tablet Oral BID  . vancomycin  1,250 mg Intravenous Q24H  . [START ON 01/04/2017] Vitamin D (Ergocalciferol)  50,000 Units Oral Q Sat    Abtx:  Anti-infectives    Start     Dose/Rate Route Frequency Ordered Stop   01/01/17 2100  vancomycin (VANCOCIN) 1,250 mg in sodium chloride 0.9 % 250 mL IVPB     1,250 mg 166.7 mL/hr over 90 Minutes Intravenous Every 24 hours 12/31/16 2020  01/01/17 0600  ciprofloxacin (CIPRO) IVPB 400 mg     400 mg 200 mL/hr over 60 Minutes Intravenous Every 12 hours 12/31/16 1806     12/31/16 1700  metroNIDAZOLE (FLAGYL) IVPB 500 mg     500 mg 100 mL/hr over 60 Minutes Intravenous Every 8 hours 12/31/16 1551     12/31/16 1600  ciprofloxacin (CIPRO) IVPB 400 mg     400 mg 200 mL/hr over 60 Minutes Intravenous STAT 12/31/16 1558 12/31/16 1901   12/31/16 1600  vancomycin (VANCOCIN) 2,000 mg in sodium chloride 0.9 % 500 mL IVPB     2,000 mg 250 mL/hr over 120 Minutes Intravenous STAT 12/31/16 1559 01/01/17 0004   12/31/16 1415  vancomycin (VANCOCIN) 1,000 mg in sodium chloride 0.9 % 250 mL IVPB  Status:  Discontinued     1,000 mg 250 mL/hr over 60 Minutes  Intravenous  Once 12/31/16 1407 12/31/16 1408   12/31/16 1408  vancomycin (VANCOCIN) IVPB 1000 mg/200 mL premix  Status:  Discontinued     1,000 mg 200 mL/hr over 60 Minutes Intravenous 60 min pre-op 12/31/16 1408 12/31/16 1536   12/31/16 1408  vancomycin (VANCOCIN) 1-5 GM/200ML-% IVPB  Status:  Discontinued    Comments:  Harvell, Gwendolyn  : cabinet override      12/31/16 1408 12/31/16 1558      Total days of antibiotics: 1 vanco/cipro/flagyl          Social History:  reports that he quit smoking about 12 years ago. He has never used smokeless tobacco. He reports that he drinks about 1.2 oz of alcohol per week . He reports that he does not use drugs.  Family History  Problem Relation Age of Onset  . COPD Mother   . Alzheimer's disease Father     General ROS: denies f/c, normal wt, no change; normal PO; normal BM, normal urine, no hx of trauma or wounds to leg. see HPI.  Please see HPI. 12 point ROS o/w (-)   Blood pressure (!) 107/56, pulse 90, temperature 98.4 F (36.9 C), temperature source Oral, resp. rate 18, height 5' 7"  (1.702 m), weight 110.2 kg (242 lb 15.2 oz), SpO2 97 %. General appearance: alert, cooperative and no distress Eyes: negative findings: conjunctivae and sclerae normal and pupils equal, round, reactive to light and accomodation Throat: lips, mucosa, and tongue normal; teeth and gums normal Neck: no adenopathy and supple, symmetrical, trachea midline Lungs: clear to auscultation bilaterally Heart: regular rate and rhythm Abdomen: normal findings: bowel sounds normal and soft, non-tender Extremities: edema none. His R hip wound is grossly swollen and tender. blister formation centrally.    Results for orders placed or performed during the hospital encounter of 12/31/16 (from the past 48 hour(s))  CBC     Status: Abnormal   Collection Time: 12/31/16 11:36 AM  Result Value Ref Range   WBC 10.6 (H) 4.0 - 10.5 K/uL   RBC 3.34 (L) 4.22 - 5.81 MIL/uL    Hemoglobin 11.2 (L) 13.0 - 17.0 g/dL   HCT 30.8 (L) 39.0 - 52.0 %   MCV 92.2 78.0 - 100.0 fL   MCH 33.5 26.0 - 34.0 pg   MCHC 36.4 (H) 30.0 - 36.0 g/dL   RDW 12.9 11.5 - 15.5 %   Platelets 211 150 - 400 K/uL  Comprehensive metabolic panel     Status: Abnormal   Collection Time: 12/31/16 11:36 AM  Result Value Ref Range   Sodium 121 (L) 135 - 145 mmol/L  Potassium 4.1 3.5 - 5.1 mmol/L   Chloride 90 (L) 101 - 111 mmol/L   CO2 19 (L) 22 - 32 mmol/L   Glucose, Bld 123 (H) 65 - 99 mg/dL   BUN 33 (H) 6 - 20 mg/dL   Creatinine, Ser 1.82 (H) 0.61 - 1.24 mg/dL   Calcium 9.0 8.9 - 10.3 mg/dL   Total Protein 7.1 6.5 - 8.1 g/dL   Albumin 3.3 (L) 3.5 - 5.0 g/dL   AST 44 (H) 15 - 41 U/L   ALT 28 17 - 63 U/L   Alkaline Phosphatase 56 38 - 126 U/L   Total Bilirubin 1.0 0.3 - 1.2 mg/dL   GFR calc non Af Amer 40 (L) >60 mL/min   GFR calc Af Amer 46 (L) >60 mL/min    Comment: (NOTE) The eGFR has been calculated using the CKD EPI equation. This calculation has not been validated in all clinical situations. eGFR's persistently <60 mL/min signify possible Chronic Kidney Disease.    Anion gap 12 5 - 15  Type and screen [place]     Status: None   Collection Time: 12/31/16 11:36 AM  Result Value Ref Range   ABO/RH(D) A NEG    Antibody Screen NEG    Sample Expiration 12/45/8099   Basic metabolic panel     Status: Abnormal   Collection Time: 12/31/16  3:10 PM  Result Value Ref Range   Sodium 122 (L) 135 - 145 mmol/L   Potassium 4.0 3.5 - 5.1 mmol/L   Chloride 91 (L) 101 - 111 mmol/L   CO2 20 (L) 22 - 32 mmol/L   Glucose, Bld 111 (H) 65 - 99 mg/dL   BUN 31 (H) 6 - 20 mg/dL   Creatinine, Ser 1.62 (H) 0.61 - 1.24 mg/dL   Calcium 9.2 8.9 - 10.3 mg/dL   GFR calc non Af Amer 46 (L) >60 mL/min   GFR calc Af Amer 53 (L) >60 mL/min    Comment: (NOTE) The eGFR has been calculated using the CKD EPI equation. This calculation has not been validated in all clinical  situations. eGFR's persistently <60 mL/min signify possible Chronic Kidney Disease.    Anion gap 11 5 - 15  Culture, blood (routine x 2)     Status: None (Preliminary result)   Collection Time: 12/31/16  5:35 PM  Result Value Ref Range   Specimen Description BLOOD BLOOD RIGHT ARM    Special Requests BOTTLES DRAWN AEROBIC AND ANAEROBIC 7 CC EA    Culture  Setup Time      GRAM POSITIVE COCCI IN CLUSTERS IN BOTH AEROBIC AND ANAEROBIC BOTTLES Organism ID to follow CRITICAL RESULT CALLED TO, READ BACK BY AND VERIFIED WITH: EBurman Foster.D. 12:25 01/01/17 ([email or web address]) Performed at [facility], [place] [address], [place], [place] [address]    Culture GRAM POSITIVE COCCI    Report Status PENDING   Blood Culture ID Panel (Reflexed)     Status: Abnormal   Collection Time: 12/31/16  5:35 PM  Result Value Ref Range   Enterococcus species NOT DETECTED NOT DETECTED   Listeria monocytogenes NOT DETECTED NOT DETECTED   Staphylococcus species DETECTED (A) NOT DETECTED    Comment: CRITICAL RESULT CALLED TO, READ BACK BY AND VERIFIED WITH: EBurman Foster.D. 12:25 01/01/17 (wilsonm)    Staphylococcus aureus DETECTED (A) NOT DETECTED    Comment: Methicillin (oxacillin) susceptible Staphylococcus aureus (MSSA). Preferred therapy is anti staphylococcal beta lactam antibiotic (Cefazolin or Nafcillin), unless  clinically contraindicated. CRITICAL RESULT CALLED TO, READ BACK BY AND VERIFIED WITH: EBurman Foster.D. 12:25 01/01/17 (wilsonm)    Methicillin resistance NOT DETECTED NOT DETECTED   Streptococcus species NOT DETECTED NOT DETECTED   Streptococcus agalactiae NOT DETECTED NOT DETECTED   Streptococcus pneumoniae NOT DETECTED NOT DETECTED   Streptococcus pyogenes NOT DETECTED NOT DETECTED   Acinetobacter baumannii NOT DETECTED NOT DETECTED   Enterobacteriaceae species NOT DETECTED NOT DETECTED   Enterobacter cloacae complex NOT DETECTED NOT DETECTED   Escherichia coli NOT DETECTED NOT DETECTED    Klebsiella oxytoca NOT DETECTED NOT DETECTED   Klebsiella pneumoniae NOT DETECTED NOT DETECTED   Proteus species NOT DETECTED NOT DETECTED   Serratia marcescens NOT DETECTED NOT DETECTED   Haemophilus influenzae NOT DETECTED NOT DETECTED   Neisseria meningitidis NOT DETECTED NOT DETECTED   Pseudomonas aeruginosa NOT DETECTED NOT DETECTED   Candida albicans NOT DETECTED NOT DETECTED   Candida glabrata NOT DETECTED NOT DETECTED   Candida krusei NOT DETECTED NOT DETECTED   Candida parapsilosis NOT DETECTED NOT DETECTED   Candida tropicalis NOT DETECTED NOT DETECTED    Comment: Performed at [facility], [address] [place], [place] [address]  Culture, blood (routine x 2)     Status: None (Preliminary result)   Collection Time: 12/31/16  5:45 PM  Result Value Ref Range   Specimen Description BLOOD BLOOD RIGHT ARM    Special Requests BOTTLES DRAWN AEROBIC AND ANAEROBIC 5.5 CC EA    Culture  Setup Time      GRAM POSITIVE COCCI IN BOTH AEROBIC AND ANAEROBIC BOTTLES CRITICAL VALUE NOTED.  VALUE IS CONSISTENT WITH PREVIOUSLY REPORTED AND CALLED VALUE. Performed at [facility], [place] [address], [place], [place] [address]    Culture GRAM POSITIVE COCCI    Report Status PENDING   Basic metabolic panel     Status: Abnormal   Collection Time: 01/01/17  5:11 AM  Result Value Ref Range   Sodium 127 (L) 135 - 145 mmol/L   Potassium 3.9 3.5 - 5.1 mmol/L   Chloride 98 (L) 101 - 111 mmol/L   CO2 19 (L) 22 - 32 mmol/L   Glucose, Bld 110 (H) 65 - 99 mg/dL   BUN 31 (H) 6 - 20 mg/dL   Creatinine, Ser 1.58 (H) 0.61 - 1.24 mg/dL   Calcium 8.5 (L) 8.9 - 10.3 mg/dL   GFR calc non Af Amer 47 (L) >60 mL/min   GFR calc Af Amer 54 (L) >60 mL/min    Comment: (NOTE) The eGFR has been calculated using the CKD EPI equation. This calculation has not been validated in all clinical situations. eGFR's persistently <60 mL/min signify possible Chronic Kidney Disease.    Anion gap 10 5 - 15   CBC     Status: Abnormal   Collection Time: 01/01/17  5:11 AM  Result Value Ref Range   WBC 10.6 (H) 4.0 - 10.5 K/uL   RBC 3.02 (L) 4.22 - 5.81 MIL/uL   Hemoglobin 10.2 (L) 13.0 - 17.0 g/dL   HCT 28.2 (L) 39.0 - 52.0 %   MCV 93.4 78.0 - 100.0 fL   MCH 33.8 26.0 - 34.0 pg   MCHC 36.2 (H) 30.0 - 36.0 g/dL   RDW 13.3 11.5 - 15.5 %   Platelets 199 150 - 400 K/uL      Component Value Date/Time   SDES BLOOD BLOOD RIGHT ARM 12/31/2016 1745   SPECREQUEST BOTTLES DRAWN AEROBIC AND ANAEROBIC 5.5  CC EA 12/31/2016 1745   CULT GRAM POSITIVE COCCI 12/31/2016 1745   REPTSTATUS PENDING 12/31/2016 1745   US Renal  Result Date: 01/01/2017 CLINICAL DATA:  Acute kidney injury EXAM: RENAL / URINARY TRACT ULTRASOUND COMPLETE COMPARISON:  None. FINDINGS: Right Kidney: Length: 13.6 cm. Echogenicity within normal limits. No mass or hydronephrosis visualized. Left Kidney: Length: 14.2 cm. Echogenicity within normal limits. No mass or hydronephrosis visualized. Bladder: Appears normal for degree of bladder distention. IMPRESSION: Within normal limits. Electronically Signed   By: Marybelle Killings M.D.   On: 01/01/2017 08:56   Dg Chest Port 1 View  Result Date: 12/31/2016 CLINICAL DATA:  Preop. EXAM: PORTABLE CHEST 1 VIEW COMPARISON:  05/08/2010. FINDINGS: Low lung volumes. Normal cardiomediastinal silhouette. No active infiltrates or failure. No effusion or pneumothorax. IMPRESSION: Low lung volumes, otherwise unremarkable chest. Electronically Signed   By: Staci Righter M.D.   On: 12/31/2016 15:53   Recent Results (from the past 240 hour(s))  Culture, blood (routine x 2)     Status: None (Preliminary result)   Collection Time: 12/31/16  5:35 PM  Result Value Ref Range Status   Specimen Description BLOOD BLOOD RIGHT ARM  Final   Special Requests BOTTLES DRAWN AEROBIC AND ANAEROBIC 7 CC EA  Final   Culture  Setup Time   Final    GRAM POSITIVE COCCI IN CLUSTERS IN BOTH AEROBIC AND ANAEROBIC BOTTLES Organism ID  to follow CRITICAL RESULT CALLED TO, READ BACK BY AND VERIFIED WITH: EBurman Foster.D. 12:25 01/01/17 ([email or web address]) Performed at [facility], [place] [address] [place], [place] [address]    Culture GRAM POSITIVE COCCI  Final   Report Status PENDING  Incomplete  Blood Culture ID Panel (Reflexed)     Status: Abnormal   Collection Time: 12/31/16  5:35 PM  Result Value Ref Range Status   Enterococcus species NOT DETECTED NOT DETECTED Final   Listeria monocytogenes NOT DETECTED NOT DETECTED Final   Staphylococcus species DETECTED (A) NOT DETECTED Final    Comment: CRITICAL RESULT CALLED TO, READ BACK BY AND VERIFIED WITH: EBurman Foster.D. 12:25 01/01/17 (wilsonm)    Staphylococcus aureus DETECTED (A) NOT DETECTED Final    Comment: Methicillin (oxacillin) susceptible Staphylococcus aureus (MSSA). Preferred therapy is anti staphylococcal beta lactam antibiotic (Cefazolin or Nafcillin), unless clinically contraindicated. CRITICAL RESULT CALLED TO, READ BACK BY AND VERIFIED WITH: EBurman Foster.D. 12:25 01/01/17 (wilsonm)    Methicillin resistance NOT DETECTED NOT DETECTED Final   Streptococcus species NOT DETECTED NOT DETECTED Final   Streptococcus agalactiae NOT DETECTED NOT DETECTED Final   Streptococcus pneumoniae NOT DETECTED NOT DETECTED Final   Streptococcus pyogenes NOT DETECTED NOT DETECTED Final   Acinetobacter baumannii NOT DETECTED NOT DETECTED Final   Enterobacteriaceae species NOT DETECTED NOT DETECTED Final   Enterobacter cloacae complex NOT DETECTED NOT DETECTED Final   Escherichia coli NOT DETECTED NOT DETECTED Final   Klebsiella oxytoca NOT DETECTED NOT DETECTED Final   Klebsiella pneumoniae NOT DETECTED NOT DETECTED Final   Proteus species NOT DETECTED NOT DETECTED Final   Serratia marcescens NOT DETECTED NOT DETECTED Final   Haemophilus influenzae NOT DETECTED NOT DETECTED Final   Neisseria meningitidis NOT DETECTED NOT DETECTED Final   Pseudomonas aeruginosa NOT  DETECTED NOT DETECTED Final   Candida albicans NOT DETECTED NOT DETECTED Final   Candida glabrata NOT DETECTED NOT DETECTED Final   Candida krusei NOT DETECTED NOT DETECTED Final   Candida parapsilosis NOT DETECTED NOT DETECTED Final   Candida  tropicalis NOT DETECTED NOT DETECTED Final    Comment: Performed at [facility], [place] [address] [place], [place] [address]  Culture, blood (routine x 2)     Status: None (Preliminary result)   Collection Time: 12/31/16  5:45 PM  Result Value Ref Range Status   Specimen Description BLOOD BLOOD RIGHT ARM  Final   Special Requests BOTTLES DRAWN AEROBIC AND ANAEROBIC 5.5 CC EA  Final   Culture  Setup Time   Final    GRAM POSITIVE COCCI IN BOTH AEROBIC AND ANAEROBIC BOTTLES CRITICAL VALUE NOTED.  VALUE IS CONSISTENT WITH PREVIOUSLY REPORTED AND CALLED VALUE. Performed at [facility], [place] [address], [place], [place] [address]    Culture GRAM POSITIVE COCCI  Final   Report Status PENDING  Incomplete      01/01/2017, 3:37 PM     LOS: 1 day    Records and images were personally reviewed where available.

## 2017-01-01 NOTE — Progress Notes (Signed)
Pharmacy Antibiotic Note  [name] is a 56 y.o. male with hx right THA revision, presented to [facility] on 12/31/2016 with right hip pain.  Patient was scheduled for I&D with poly exchange on 01/01/16 for suspected right hip infection, but this procedure was cancelled d/t renal insufficiency and hyponatremia.  Plan for OR later day (01/01/17).  Vancomycin, cipro and flagyl were started on admission.  2/2 blood cultures now with GPC in clusters, BCID showed MSSA.  ID recom narrowed abx to daptomycin for bacteremia with plan for TTE to r/o endocarditis.  - afeb, wbc 10.6, scr trending down 1.58 (crcl~61)  Plan: - daptomycin 800 mg IV q24 (~8 mg/kg/day) - CK weekly -- will order with AM labs on 3/22  ___________________________  Height: 5\' 7"  (170.2 cm) Weight: 242 lb 15.2 oz (110.2 kg) IBW/kg (Calculated) : 66.1  Temp (24hrs), Avg:98.5 F (36.9 C), Min:98 F (36.7 C), Max:99.6 F (37.6 C)   Recent Labs Lab 12/31/16 1136 12/31/16 1510 01/01/17 0511  WBC 10.6*  --  10.6*  CREATININE 1.82* 1.62* 1.58*    Estimated Creatinine Clearance: 61.1 mL/min (A) (by C-G formula based on SCr of 1.58 mg/dL (H)).    Allergies  Allergen Reactions  . Cephalexin Swelling    Facial swelling after po kelfex.   . Ace Inhibitors Rash  . Doxycycline Rash    Antimicrobials this admission:  3/20 vanc >>  3/21 3/20 cipro >>  3/21 3/20 flagyl >>3/21 3/21 dapto>>  Dose adjustments this admission:  n/a   Microbiology results:  3/20 BCx: 2/2 (both bottles) MSSA  Thank you for allowing pharmacy to be a part of this patient's care.  Lynelle Doctor 01/01/2017 3:48 PM

## 2017-01-01 NOTE — Progress Notes (Signed)
PHARMACY - PHYSICIAN COMMUNICATION CRITICAL VALUE ALERT - BLOOD CULTURE IDENTIFICATION (BCID)  Results for orders placed or performed during the hospital encounter of 12/31/16  Blood Culture ID Panel (Reflexed) (Collected: 12/31/2016  5:35 PM)  Result Value Ref Range   Enterococcus species NOT DETECTED NOT DETECTED   Listeria monocytogenes NOT DETECTED NOT DETECTED   Staphylococcus species DETECTED (A) NOT DETECTED   Staphylococcus aureus DETECTED (A) NOT DETECTED   Methicillin resistance NOT DETECTED NOT DETECTED   Streptococcus species NOT DETECTED NOT DETECTED   Streptococcus agalactiae NOT DETECTED NOT DETECTED   Streptococcus pneumoniae NOT DETECTED NOT DETECTED   Streptococcus pyogenes NOT DETECTED NOT DETECTED   Acinetobacter baumannii NOT DETECTED NOT DETECTED   Enterobacteriaceae species NOT DETECTED NOT DETECTED   Enterobacter cloacae complex NOT DETECTED NOT DETECTED   Escherichia coli NOT DETECTED NOT DETECTED   Klebsiella oxytoca NOT DETECTED NOT DETECTED   Klebsiella pneumoniae NOT DETECTED NOT DETECTED   Proteus species NOT DETECTED NOT DETECTED   Serratia marcescens NOT DETECTED NOT DETECTED   Haemophilus influenzae NOT DETECTED NOT DETECTED   Neisseria meningitidis NOT DETECTED NOT DETECTED   Pseudomonas aeruginosa NOT DETECTED NOT DETECTED   Candida albicans NOT DETECTED NOT DETECTED   Candida glabrata NOT DETECTED NOT DETECTED   Candida krusei NOT DETECTED NOT DETECTED   Candida parapsilosis NOT DETECTED NOT DETECTED   Candida tropicalis NOT DETECTED NOT DETECTED    Name of physician (or Provider) Contacted: Mikhail  Changes to prescribed antibiotics required: Already on vanc; could consider stopping Cipro and Flagyl  Shaunita Seney A 01/01/2017  1:50 PM

## 2017-01-01 NOTE — Anesthesia Procedure Notes (Signed)
Procedure Name: Intubation Date/Time: 01/01/2017 6:20 PM Performed by: Anne Fu Pre-anesthesia Checklist: Patient identified, Emergency Drugs available, Suction available, Patient being monitored and Timeout performed Patient Re-evaluated:Patient Re-evaluated prior to inductionOxygen Delivery Method: Circle system utilized Preoxygenation: Pre-oxygenation with 100% oxygen Intubation Type: IV induction Ventilation: Mask ventilation without difficulty Laryngoscope Size: Mac and 4 Grade View: Grade II Tube type: Oral Tube size: 7.5 mm Number of attempts: 1 Airway Equipment and Method: Stylet Placement Confirmation: ETT inserted through vocal cords under direct vision,  positive ETCO2,  CO2 detector and breath sounds checked- equal and bilateral Secured at: 23 cm Tube secured with: Tape Dental Injury: Teeth and Oropharynx as per pre-operative assessment  Difficulty Due To: Difficulty was anticipated Future Recommendations: Recommend- induction with short-acting agent, and alternative techniques readily available

## 2017-01-01 NOTE — Op Note (Signed)
NAMEMarland Kitchen  Christopher, [name]                ACCOUNT NO.:  [id number]  MEDICAL RECORD NO.:  [id number]  LOCATION:                                 FACILITY:  PHYSICIAN:  Pietro Cassis. Alvan Dame, M.D.       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  PROCEDURE: 1. Sharp excisional debridement of skin, subcutaneous tissue,     nonviable tissue involving a 14-inch incision. 2. Nonexcisional debridement of the right hip with 9 L normal saline     solution. 3. Revision of the right hip from a polyethylene liner and a femoral     head.  There was a 36+ 5 TS ceramic ball and a 36+ 4 neutral 64     polyethylene insert for the acetabulum. 4. Placement of antibiotic laden beads, i.e. Stimulan with vancomycin     powder.  SURGEON:  Pietro Cassis. Alvan Dame, M.D.  ASSISTANT:  Danae Orleans, PA-C.  Note that, [name] was present for the entirety of the case from preoperative position, perioperative management of the operative extremity, general facilitation of the case, and primary wound closure.  ANESTHESIA:  General.  ESTIMATED BLOOD LOSS:  1300 mL.  IV FLUIDS:  Per anesthetic record.  BLOOD PRODUCTS:  Two units of blood were being planned to be given to the patient either at the end of the case or in the PACU.  SPECIMEN:  The patient was noted to have a significant amount of pus under pressure.  Cultures were taken as soon as able to.  It did have the appearance of a Staph infection overall versus Strep.  Culture is pending.  They were sent to Pathology for Gram stain and culture analysis, aerobic, anaerobic.  DRAINS:  None.  INDICATIONS FOR PROCEDURE:  [name] is a pleasant 56 year old male. He had his hip revised  about 18 months ago.  He had returned back to work and was in his normal state health until he presented to the office acutely on Friday afternoon.  At that time, we ordered labs and he had noted to have a sedimentation rate of 7, but a C-reactive protein of 49. However, in addition to these labs coming back, he was having increasing pain.  I tried to get him admitted to the hospital on Monday night for Riddle operation on Tuesday, but it was not until Tuesday, little bit later that we got him admitted.  At the time of his initial admission and trying to get him to the operating room on Tuesday night, he was noted to have a sodium of 121 and a rising creatinine of 1.8.  It was decided that he was not safe for him to go to the operating room. He was subsequently admitted, placed on IV antibiotics, and consulted by Infectious Disease.  His repeat labs in the morning indicated Riddle improving sodium as well as the improving creatinine.  Given this and the necessity  to take him to the operating room, he was scheduled for procedure.  Risks, benefits, and necessity of the procedure were discussed and reviewed.  Given the acuity of this infection, this was going to be treated as a single staged attempt at salvaging his hip, particularly given the complexity of his previous revision procedures.  DESCRIPTION OF PROCEDURE:  The patient was brought to the operative theater.  Once adequate anesthesia, no preoperative antibiotics were administered, he was already on therapeutic doses of vancomycin and ciprofloxacin from the floor.  He was positioned in the left lateral decubitus position with the right side up.  The right lower extremity was clipped, prepped, and draped in sterile fashion.  Time-out was performed identifying the patient, planned procedure, and extremity.  He was noted to have large blistering already of his skin with erythematous change.  I ended up demarcating the skin where I want to make  the incision and ellipsed his entire incision including about Riddle inch and a half or so in the midportion where this was blistering.  For this reason, we had a wound VAC on standby.  As soon as I broke through the skin and subcutaneous tissue, we encountered eruption of pus from his hip.  I then used a culture swab and took culture swabs for aerobic, anaerobic analysis.  At this point, I sharply excised the entire skin incision again 14 inches including the subcutaneous tissue and nonviable tissue.  The dissection was carried down to try to define the iliotibial band and gluteal fascia and noted to have some dehiscence over the trochanter segment.  Following exposure and creating these layers, I then went further for dissection along the posterior aspect of the hip, which was identified fairly easily.  At this point, after initial debridement and removal of some of the pseudocapsule in this area, I irrigated the hip right now with 3 L of normal saline solution.  Following this, we dislocated the hip, removed the femoral head.  It was after further debridement, I was able to place the trunnion onto the acetabulum and retracted anteriorly and the acetabular liner was removed.  At this point, I irrigated the hip again with 3 L of normal saline solution with pulse lavage.  Following this, we had previously on the back table made vancomycin-impregnated stimulant beads.  I ended up placing some of the beads into the acetabular shell and then impacted in a new 36+ 4 neutral liner to match this 64 acetabular liner.  After drying the trunnion, the final 36+ 5 titanium sleeve ceramic ball was impacted on clean and dried trunnion.  The hip was reduced.  We then re-irrigated the hip with the last 3 L of normal saline solution for a total of 9 L of normal saline solution.  At this point, we placed half the stimulant beads into the deep tissue.  We then reapproximated the prepared gluteal fascia and  iliotibial band using #1 PDS suture in multiple interrupted fashion.  I was able to get this reapproximated without any gapping.  At this point, the subcutaneous layer was then filled with the remaining part of the beads and then the subcutaneous dermal layer was reapproximated using a combination of 2-0 Vicryl and some #1 Vicryl for reapproximation purposes.  The skin was then closed with staples.  The hip was cleaned, dried, and dressed sterilely using Mepilex dressings.  He was then awoken from anesthesia and brought to the recovery room in stable condition tolerating the procedure well.  We reviewed the findings with him.  He already has Infectious Disease consultation as well as hospitalist based on his impaired sodium and renal function.     Pietro Cassis Alvan Dame, M.D.     MDO/MEDQ  D:  01/01/2017  T:  01/01/2017  Job:  [id number]

## 2017-01-02 ENCOUNTER — Inpatient Hospital Stay (HOSPITAL_COMMUNITY): Payer: Worker's Compensation

## 2017-01-02 ENCOUNTER — Inpatient Hospital Stay (HOSPITAL_COMMUNITY): Payer: Self-pay

## 2017-01-02 ENCOUNTER — Encounter (HOSPITAL_COMMUNITY): Payer: Self-pay | Admitting: Orthopedic Surgery

## 2017-01-02 ENCOUNTER — Other Ambulatory Visit: Payer: Self-pay

## 2017-01-02 DIAGNOSIS — T8451XD Infection and inflammatory reaction due to internal right hip prosthesis, subsequent encounter: Secondary | ICD-10-CM

## 2017-01-02 DIAGNOSIS — T8459XD Infection and inflammatory reaction due to other internal joint prosthesis, subsequent encounter: Secondary | ICD-10-CM

## 2017-01-02 DIAGNOSIS — Z96649 Presence of unspecified artificial hip joint: Secondary | ICD-10-CM

## 2017-01-02 DIAGNOSIS — I9581 Postprocedural hypotension: Secondary | ICD-10-CM

## 2017-01-02 LAB — BASIC METABOLIC PANEL
Anion gap: 12 (ref 5–15)
BUN: 21 mg/dL — AB (ref 6–20)
CALCIUM: 8.4 mg/dL — AB (ref 8.9–10.3)
CO2: 15 mmol/L — ABNORMAL LOW (ref 22–32)
CREATININE: 1.16 mg/dL (ref 0.61–1.24)
Chloride: 105 mmol/L (ref 101–111)
GFR calc non Af Amer: >60 mL/min (ref >60)
Glucose, Bld: 115 mg/dL — ABNORMAL HIGH (ref 65–99)
Potassium: 3.9 mmol/L (ref 3.5–5.1)
SODIUM: 132 mmol/L — AB (ref 135–145)

## 2017-01-02 LAB — TYPE AND SCREEN
ABO/RH(D): A NEG
Antibody Screen: NEGATIVE
UNIT DIVISION: 0
Unit division: 0

## 2017-01-02 LAB — CBC
HCT: 30.5 % — ABNORMAL LOW (ref 39.0–52.0)
Hemoglobin: 10.6 g/dL — ABNORMAL LOW (ref 13.0–17.0)
MCH: 31.6 pg (ref 26.0–34.0)
MCHC: 34.8 g/dL (ref 30.0–36.0)
MCV: 91 fL (ref 78.0–100.0)
Platelets: 218 10*3/uL (ref 150–400)
RBC: 3.35 MIL/uL — ABNORMAL LOW (ref 4.22–5.81)
RDW: 15 % (ref 11.5–15.5)
WBC: 13.9 10*3/uL — ABNORMAL HIGH (ref 4.0–10.5)

## 2017-01-02 LAB — URINALYSIS, ROUTINE W REFLEX MICROSCOPIC
BILIRUBIN URINE: NEGATIVE
Glucose, UA: NEGATIVE mg/dL
Ketones, ur: 5 mg/dL — AB
Leukocytes, UA: NEGATIVE
Nitrite: NEGATIVE
PH: 5 (ref 5.0–8.0)
Protein, ur: NEGATIVE mg/dL
Specific Gravity, Urine: 1.008 (ref 1.005–1.030)

## 2017-01-02 LAB — POCT I-STAT 4, (NA,K, GLUC, HGB,HCT)
GLUCOSE: 110 mg/dL — AB (ref 65–99)
Glucose, Bld: 104 mg/dL — ABNORMAL HIGH (ref 65–99)
HCT: 33 % — ABNORMAL LOW (ref 39.0–52.0)
HCT: 26 % — ABNORMAL LOW (ref 39.0–52.0)
HEMOGLOBIN: 8.8 g/dL — AB (ref 13.0–17.0)
Hemoglobin: 11.2 g/dL — ABNORMAL LOW (ref 13.0–17.0)
Potassium: 3.9 mmol/L (ref 3.5–5.1)
Potassium: 3.8 mmol/L (ref 3.5–5.1)
Sodium: 121 mmol/L — ABNORMAL LOW (ref 135–145)
Sodium: 133 mmol/L — ABNORMAL LOW (ref 135–145)

## 2017-01-02 LAB — CK: CK TOTAL: 391 U/L (ref 49–397)

## 2017-01-02 LAB — BPAM RBC
BLOOD PRODUCT EXPIRATION DATE: 201804042359
Blood Product Expiration Date: 201804032359
ISSUE DATE / TIME: 201803211950
ISSUE DATE / TIME: 201803211950
UNIT TYPE AND RH: 600
Unit Type and Rh: 600

## 2017-01-02 LAB — ECHOCARDIOGRAM COMPLETE
HEIGHTINCHES: 67 in
Weight: 3950.64 oz

## 2017-01-02 LAB — MRSA PCR SCREENING: MRSA by PCR: NEGATIVE

## 2017-01-02 LAB — HIV ANTIBODY (ROUTINE TESTING W REFLEX): HIV SCREEN 4TH GENERATION: NONREACTIVE

## 2017-01-02 MED ORDER — SODIUM CHLORIDE 0.9 % IV BOLUS (SEPSIS)
500.0000 mL | Freq: Once | INTRAVENOUS | Status: AC
  Administered 2017-01-02: 500 mL via INTRAVENOUS

## 2017-01-02 NOTE — Progress Notes (Signed)
Date:  January 02, 2017 Chart reviewed for concurrent status and case management needs. Will continue to follow patient progress. Discharge Planning: following for needs Expected discharge date: [id number] Hayzlee Mcsorley, BSN, RN3, CCM   [phone] 

## 2017-01-02 NOTE — Progress Notes (Signed)
Patient is hypotensive with BP in the 65-80 systolic. Pt. already received 2 U of blood and is getting a continuous infusion of NS at 100 ml/hr.  No bleeding from the incision site. Pt. is alert & oriented,  normothermic and is asymptomatic.Orthopedic Provider on call Shanon Brow, [place]) notified. 500 ml of NS bolus given per verbal order.

## 2017-01-02 NOTE — Progress Notes (Signed)
INFECTIOUS DISEASE PROGRESS NOTE  ID: [name] is a 56 y.o. male with  Active Problems:   Essential hypertension   Acute hyponatremia   Acute renal disease   Prosthetic hip infection (HCC)   S/P hip replacement  Subjective: Without complaints  Abtx:  Anti-infectives    Start     Dose/Rate Route Frequency Ordered Stop   01/01/17 2100  vancomycin (VANCOCIN) 1,250 mg in sodium chloride 0.9 % 250 mL IVPB  Status:  Discontinued     1,250 mg 166.7 mL/hr over 90 Minutes Intravenous Every 24 hours 12/31/16 2020 01/01/17 1542   01/01/17 1700  DAPTOmycin (CUBICIN) 800 mg in sodium chloride 0.9 % IVPB     800 mg 232 mL/hr over 30 Minutes Intravenous Every 24 hours 01/01/17 1605     01/01/17 0600  ciprofloxacin (CIPRO) IVPB 400 mg  Status:  Discontinued     400 mg 200 mL/hr over 60 Minutes Intravenous Every 12 hours 12/31/16 1806 01/01/17 1542   12/31/16 1700  metroNIDAZOLE (FLAGYL) IVPB 500 mg  Status:  Discontinued     500 mg 100 mL/hr over 60 Minutes Intravenous Every 8 hours 12/31/16 1551 01/01/17 1542   12/31/16 1600  ciprofloxacin (CIPRO) IVPB 400 mg     400 mg 200 mL/hr over 60 Minutes Intravenous STAT 12/31/16 1558 12/31/16 1901   12/31/16 1600  vancomycin (VANCOCIN) 2,000 mg in sodium chloride 0.9 % 500 mL IVPB     2,000 mg 250 mL/hr over 120 Minutes Intravenous STAT 12/31/16 1559 01/01/17 0004   12/31/16 1415  vancomycin (VANCOCIN) 1,000 mg in sodium chloride 0.9 % 250 mL IVPB  Status:  Discontinued     1,000 mg 250 mL/hr over 60 Minutes Intravenous  Once 12/31/16 1407 12/31/16 1408   12/31/16 1408  vancomycin (VANCOCIN) IVPB 1000 mg/200 mL premix  Status:  Discontinued     1,000 mg 200 mL/hr over 60 Minutes Intravenous 60 min pre-op 12/31/16 1408 12/31/16 1536   12/31/16 1408  vancomycin (VANCOCIN) 1-5 GM/200ML-% IVPB  Status:  Discontinued    Comments:  Harvell, Gwendolyn  : cabinet override      12/31/16 1408 12/31/16 1558      Medications:  Scheduled: .  sodium chloride   Intravenous Once  . celecoxib  200 mg Oral Q12H  . chlorhexidine  60 mL Topical Once  . DAPTOmycin (CUBICIN)  IV  800 mg Intravenous Q24H  . dexamethasone  10 mg Intravenous Once  . docusate sodium  100 mg Oral BID  . enoxaparin (LOVENOX) injection  55 mg Subcutaneous Q24H  . ferrous sulfate  325 mg Oral TID PC  . multivitamin with minerals  1 tablet Oral Daily  . polyethylene glycol  17 g Oral BID  . senna  1 tablet Oral BID  . [START ON 01/04/2017] Vitamin D (Ergocalciferol)  50,000 Units Oral Q Sat    Objective: Vital signs in last 24 hours: Temp:  [97.2 F (36.2 C)-98.4 F (36.9 C)] 97.8 F (36.6 C) (03/22 0700) Pulse Rate:  [68-90] 81 (03/22 0800) Resp:  [12-30] 15 (03/22 0800) BP: (67-121)/(32-60) 121/49 (03/22 0800) SpO2:  [95 %-100 %] 100 % (03/22 0800) Weight:  [112 kg (246 lb 14.6 oz)] 112 kg (246 lb 14.6 oz) (03/22 0500)   General appearance: alert, cooperative and no distress Resp: clear to auscultation bilaterally Cardio: regular rate and rhythm GI: normal findings: bowel sounds normal and soft, non-tender Extremities: wound dressed on R hip. edema surrounding wound.  Neurologic:  Mental status: Alert, oriented, thought content appropriate Sensory: grossly normal light touch in RLE.   Lab Results  Recent Labs  01/01/17 0511 01/02/17 0334  WBC 10.6* 13.9*  HGB 10.2* 10.6*  HCT 28.2* 30.5*  NA 127* 132*  K 3.9 3.9  CL 98* 105  CO2 19* 15*  BUN 31* 21*  CREATININE 1.58* 1.16   Liver Panel  Recent Labs  12/31/16 1136  PROT 7.1  ALBUMIN 3.3*  AST 44*  ALT 28  ALKPHOS 56  BILITOT 1.0   Sedimentation Rate  Recent Labs  01/01/17 1551  ESRSEDRATE 118*   C-Reactive Protein  Recent Labs  01/01/17 1551  CRP 29.7*    Microbiology: Recent Results (from the past 240 hour(s))  Culture, blood (routine x 2)     Status: Abnormal (Preliminary result)   Collection Time: 12/31/16  5:35 PM  Result Value Ref Range Status    Specimen Description BLOOD BLOOD RIGHT ARM  Final   Special Requests BOTTLES DRAWN AEROBIC AND ANAEROBIC 7 CC EA  Final   Culture  Setup Time   Final    GRAM POSITIVE COCCI IN CLUSTERS IN BOTH AEROBIC AND ANAEROBIC BOTTLES CRITICAL RESULT CALLED TO, READ BACK BY AND VERIFIED WITH: EBurman Foster.D. 12:25 01/01/17 ([email or web address])    Culture (A)  Final    STAPHYLOCOCCUS AUREUS SUSCEPTIBILITIES TO FOLLOW Performed at [facility], [address] [place], [place] [address]    Report Status PENDING  Incomplete  Blood Culture ID Panel (Reflexed)     Status: Abnormal   Collection Time: 12/31/16  5:35 PM  Result Value Ref Range Status   Enterococcus species NOT DETECTED NOT DETECTED Final   Listeria monocytogenes NOT DETECTED NOT DETECTED Final   Staphylococcus species DETECTED (A) NOT DETECTED Final    Comment: CRITICAL RESULT CALLED TO, READ BACK BY AND VERIFIED WITH: EBurman Foster.D. 12:25 01/01/17 ([email or web address])    Staphylococcus aureus DETECTED (A) NOT DETECTED Final    Comment: Methicillin (oxacillin) susceptible Staphylococcus aureus (MSSA). Preferred therapy is anti staphylococcal beta lactam antibiotic (Cefazolin or Nafcillin), unless clinically contraindicated. CRITICAL RESULT CALLED TO, READ BACK BY AND VERIFIED WITH: EBurman Foster.D. 12:25 01/01/17 ([email or web address])    Methicillin resistance NOT DETECTED NOT DETECTED Final   Streptococcus species NOT DETECTED NOT DETECTED Final   Streptococcus agalactiae NOT DETECTED NOT DETECTED Final   Streptococcus pneumoniae NOT DETECTED NOT DETECTED Final   Streptococcus pyogenes NOT DETECTED NOT DETECTED Final   Acinetobacter baumannii NOT DETECTED NOT DETECTED Final   Enterobacteriaceae species NOT DETECTED NOT DETECTED Final   Enterobacter cloacae complex NOT DETECTED NOT DETECTED Final   Escherichia coli NOT DETECTED NOT DETECTED Final   Klebsiella oxytoca NOT DETECTED NOT DETECTED Final   Klebsiella pneumoniae NOT DETECTED NOT DETECTED  Final   Proteus species NOT DETECTED NOT DETECTED Final   Serratia marcescens NOT DETECTED NOT DETECTED Final   Haemophilus influenzae NOT DETECTED NOT DETECTED Final   Neisseria meningitidis NOT DETECTED NOT DETECTED Final   Pseudomonas aeruginosa NOT DETECTED NOT DETECTED Final   Candida albicans NOT DETECTED NOT DETECTED Final   Candida glabrata NOT DETECTED NOT DETECTED Final   Candida krusei NOT DETECTED NOT DETECTED Final   Candida parapsilosis NOT DETECTED NOT DETECTED Final   Candida tropicalis NOT DETECTED NOT DETECTED Final    Comment: Performed at [facility], [address], [place], [place] [address]  Culture, blood (routine x 2)     Status: Abnormal (Preliminary  result)   Collection Time: 12/31/16  5:45 PM  Result Value Ref Range Status   Specimen Description BLOOD BLOOD RIGHT ARM  Final   Special Requests BOTTLES DRAWN AEROBIC AND ANAEROBIC 5.5 CC EA  Final   Culture  Setup Time   Final    GRAM POSITIVE COCCI IN BOTH AEROBIC AND ANAEROBIC BOTTLES CRITICAL VALUE NOTED.  VALUE IS CONSISTENT WITH PREVIOUSLY REPORTED AND CALLED VALUE. Performed at [facility], [place] [address] [place], [place] [address]    Culture STAPHYLOCOCCUS AUREUS (A)  Final   Report Status PENDING  Incomplete  Surgical PCR screen     Status: Abnormal   Collection Time: 01/01/17  2:11 PM  Result Value Ref Range Status   MRSA, PCR NEGATIVE NEGATIVE Final   Staphylococcus aureus POSITIVE (A) NEGATIVE Final    Comment:        The Xpert SA Assay (FDA approved for NASAL specimens in patients over 105 years of age), is one component of a comprehensive surveillance program.  Test performance has been validated by [facility] for patients greater than or equal to 68 year old. It is not intended to diagnose infection nor to guide or monitor treatment.   Aerobic/Anaerobic Culture (surgical/deep wound)     Status: None (Preliminary result)   Collection Time: 01/01/17  6:51 PM  Result  Value Ref Range Status   Specimen Description SYNOVIAL RIGHT HIP  Final   Special Requests FLUID ON SWAB  Final   Gram Stain   Final    ABUNDANT WBC PRESENT,BOTH PMN AND MONONUCLEAR FEW GRAM POSITIVE COCCI IN PAIRS Performed at [facility], [place] [address], [place], [place] [address]    Culture PENDING  Incomplete   Report Status PENDING  Incomplete  MRSA PCR Screening     Status: None   Collection Time: 01/01/17 10:16 PM  Result Value Ref Range Status   MRSA by PCR NEGATIVE NEGATIVE Final    Comment:        The GeneXpert MRSA Assay (FDA approved for NASAL specimens only), is one component of a comprehensive MRSA colonization surveillance program. It is not intended to diagnose MRSA infection nor to guide or monitor treatment for MRSA infections.     Studies/Results: US Renal  Result Date: 01/01/2017 CLINICAL DATA:  Acute kidney injury EXAM: RENAL / URINARY TRACT ULTRASOUND COMPLETE COMPARISON:  None. FINDINGS: Right Kidney: Length: 13.6 cm. Echogenicity within normal limits. No mass or hydronephrosis visualized. Left Kidney: Length: 14.2 cm. Echogenicity within normal limits. No mass or hydronephrosis visualized. Bladder: Appears normal for degree of bladder distention. IMPRESSION: Within normal limits. Electronically Signed   By: Marybelle Killings M.D.   On: 01/01/2017 08:56   Dg Chest Port 1 View  Result Date: 12/31/2016 CLINICAL DATA:  Preop. EXAM: PORTABLE CHEST 1 VIEW COMPARISON:  05/08/2010. FINDINGS: Low lung volumes. Normal cardiomediastinal silhouette. No active infiltrates or failure. No effusion or pneumothorax. IMPRESSION: Low lung volumes, otherwise unremarkable chest. Electronically Signed   By: Staci Righter M.D.   On: 12/31/2016 15:53     Assessment/Plan: MSSA bacteremia Check CK 391- normal ( related to his surgery?) Repeat BCx pending this AM Will check TTE Would consider PIC when his repeat BCx are (-)  Post-operative hypotension Now in ICU  R THR  infection, osteomyelitis Poly-exchange, placement of beads 3-21 ESR 118, CRP 29.7  HTN Hypotensive this AM with normal HR.  Improved now.    HypoNa Suspect hypovolemia.  Could check Ardelia Mems,  Uosm ect (pending) Serum Na and Cr improved this AM   ARF (Cr 0.78 in August 2017) Renal u/s nl hypovolemia, ARB use Cr better this AM  Total days of antibiotics: 1 dapto         [name] Infectious Diseases (pager) [phone] [email or web address] 01/02/2017, 9:51 AM  LOS: 2 days

## 2017-01-02 NOTE — Evaluation (Signed)
Physical Therapy Evaluation Patient Details Name: [name] MRN: [id number] DOB: Aug 27, 1961 Today's Date: 01/02/2017   History of Present Illness  56 year old man s/p R hip I & D and liner exchange  Clinical Impression  The patient tolerated short ambulation in room. Pt admitted with above diagnosis. Pt currently with functional limitations due to the deficits listed below (see PT Problem List).  Pt will benefit from skilled PT to increase their independence and safety with mobility to allow discharge to the venue listed below.       Follow Up Recommendations Home health PT;Supervision/Assistance - 24 hour    Equipment Recommendations  None recommended by PT    Recommendations for Other Services       Precautions / Restrictions Precautions Precautions: Fall;Posterior Hip Restrictions RUE Weight Bearing: Weight bearing as tolerated      Mobility  Bed Mobility Overal bed mobility: Needs Assistance Bed Mobility: Supine to Sit;Sit to Supine     Supine to sit: Min assist Sit to supine: +2 for physical assistance   General bed mobility comments: assist for RLE out of bed; assist for bil LEs back to bed and cues for sequence, THPs  Transfers Overall transfer level: Needs assistance Equipment used: Rolling walker (2 wheeled) Transfers: Sit to/from Stand Sit to Stand: Min assist         General transfer comment: cues for UE/LE placement  Ambulation/Gait Ambulation/Gait assistance: Min assist;+2 safety/equipment Ambulation Distance (Feet): 10 Feet Assistive device: Rolling walker (2 wheeled) Gait Pattern/deviations: Step-to pattern;Decreased step length - right;Decreased stance time - right;Antalgic        Stairs            Wheelchair Mobility    Modified Rankin (Stroke Patients Only)       Balance                                             Pertinent Vitals/Pain Pain Assessment: 0-10 Pain Score: 9  Pain Location: R hip with  weight bearing Pain Descriptors / Indicators: Aching Pain Intervention(s): Monitored during session;Premedicated before session;Ice applied    Home Living Family/patient expects to be discharged to:: Private residence Living Arrangements: Spouse/significant other Available Help at Discharge: Family;Available 24 hours/day Type of Home: House Home Access: Stairs to enter   [employer] of Steps: 1 Home Layout: Able to live on main level with bedroom/bathroom;Two level Home Equipment: Walker - 2 wheels;Adaptive equipment;Crutches;Bedside commode Additional Comments: was standing in shower    Prior Function Level of Independence: Independent with assistive device(s)         Comments: using crutches     Hand Dominance        Extremity/Trunk Assessment   Upper Extremity Assessment Upper Extremity Assessment: Defer to OT evaluation    Lower Extremity Assessment Lower Extremity Assessment: RLE deficits/detail RLE Deficits / Details: assist right leg off and onto bed    Cervical / Trunk Assessment Cervical / Trunk Assessment: Normal  Communication   Communication: No difficulties  Cognition Arousal/Alertness: Awake/alert Behavior During Therapy: WFL for tasks assessed/performed Overall Cognitive Status: Within Functional Limits for tasks assessed                      General Comments      Exercises     Assessment/Plan    PT Assessment Patient needs continued PT services  PT Problem List Decreased strength;Decreased range of motion;Decreased activity tolerance;Decreased mobility;Decreased safety awareness;Decreased knowledge of use of DME;Decreased cognition       PT Treatment Interventions DME instruction;Gait training;Stair training;Functional mobility training;Therapeutic activities;Therapeutic exercise    PT Goals (Current goals can be found in the Care Plan section)  Acute Rehab PT Goals Patient Stated Goal: decreased pain and return to  independence PT Goal Formulation: With patient Time For Goal Achievement: 01/16/17 Potential to Achieve Goals: Good    Frequency 7X/week   Barriers to discharge        Co-evaluation PT/OT/SLP Co-Evaluation/Treatment: Yes Reason for Co-Treatment: Complexity of the patient's impairments (multi-system involvement) PT goals addressed during session: Mobility/safety with mobility OT goals addressed during session: ADL's and self-care       End of Session   Activity Tolerance: Patient tolerated treatment well Patient left: in bed;with call bell/phone within reach;with bed alarm set Nurse Communication: Mobility status PT Visit Diagnosis: Unsteadiness on feet (R26.81);Pain Pain - Right/Left: Right Pain - part of body: Hip         Time: 4720-7218 PT Time Calculation (min) (ACUTE ONLY): 26 min   Charges:   PT Evaluation $PT Eval Moderate Complexity: 1 Procedure     PT G CodesClaretha Cooper 01/02/2017, 4:49 PM  Tresa Endo PT [phone]

## 2017-01-02 NOTE — Anesthesia Postprocedure Evaluation (Signed)
Anesthesia Post Note  Patient: Azaria Bartell  Procedure(s) Performed: Procedure(s) (LRB): IRRIGATION AND DEBRIDEMENT RIGHT TOTAL HIP WITH HEAD AND LINER EXCHANGE AND PLACEMENT OF STIMULIN BEADS (Right)  Patient location during evaluation: PACU Anesthesia Type: General Level of consciousness: awake and alert Pain management: pain level controlled Vital Signs Assessment: post-procedure vital signs reviewed and stable Respiratory status: spontaneous breathing, nonlabored ventilation, respiratory function stable and patient connected to nasal cannula oxygen Cardiovascular status: blood pressure returned to baseline and stable Postop Assessment: no signs of nausea or vomiting Anesthetic complications: no       Last Vitals:  Vitals:   01/02/17 1908 01/02/17 1933  BP: (!) 120/57   Pulse: 84   Resp: 20   Temp:  36.8 C    Last Pain:  Vitals:   01/02/17 1700  TempSrc: Oral  PainSc:                  Pearlean Sabina DAVID

## 2017-01-02 NOTE — Progress Notes (Signed)
     Subjective: 1 Day Post-Op Procedure(s) (LRB): IRRIGATION AND DEBRIDEMENT RIGHT TOTAL HIP WITH HEAD AND LINER EXCHANGE AND PLACEMENT OF STIMULIN BEADS (Right)   Patient reports pain as mild, pain considerably better as compared to prior to surgery.  Patient has had a soft BP, per the ICU nurse medicine is aware and has adjust medicine. Discussed case with Dr. Johnnye Sima who is following the patient closely.  Discussed BUN/ creat returning to normal range as well as NA which were all concerns prior to original surgery date.  Objective:   VITALS:   Vitals:   01/02/17 0800 01/02/17 0900  BP: (!) 121/49 93/68  Pulse: 81 84  Resp: 15 19  Temp:      Dorsiflexion/Plantar flexion intact Incision: dressing C/D/I  LABS  Recent Labs  12/31/16 1136 01/01/17 0511 01/02/17 0334  HGB 11.2* 10.2* 10.6*  HCT 30.8* 28.2* 30.5*  WBC 10.6* 10.6* 13.9*  PLT 211 199 218     Recent Labs  12/31/16 1510 01/01/17 0511 01/02/17 0334  NA 122* 127* 132*  K 4.0 3.9 3.9  BUN 31* 31* 21*  CREATININE 1.62* 1.58* 1.16  GLUCOSE 111* 110* 115*     Assessment/Plan: 1 Day Post-Op Procedure(s) (LRB): IRRIGATION AND DEBRIDEMENT RIGHT TOTAL HIP WITH HEAD AND LINER EXCHANGE AND PLACEMENT OF STIMULIN BEADS (Right) Advance diet Up with therapy if tolerated with hypotension Appreciate both medicine and ID being involved in the patient's care     [place] Pugh. Otisha Spickler   PAC  01/02/2017, 10:23 AM

## 2017-01-02 NOTE — Progress Notes (Signed)
  Echocardiogram 2D Echocardiogram has been performed.  [name] 01/02/2017, 3:57 PM

## 2017-01-02 NOTE — Progress Notes (Signed)
   01/02/17 1700  Vitals  BP (!) 91/42  MAP (mmHg) (!) 58  Pulse Rate 78  ECG Heart Rate 78   Dr. Ree Kida informed of patient blood pressure. Order given for 51ml NS bolus.

## 2017-01-02 NOTE — Progress Notes (Signed)
PROGRESS NOTE    [name]  EPP:[id number] DOB: 08-01-1961 DOA: 12/31/2016 PCP: No PCP Per Patient   No chief complaint on file.   Brief Narrative:  Consult for AKI and hyponatremia Assessment & Plan   Acute kidney injury -Improving -Baseline creatinine appears to be 0.7, creatinine peaked to 1.82 -Currently creatinine 1.16 -Patient was noted to be on Diovan, which as been held -AKI possibly secondary to medications versus infections versus hypovolemia -Continue IV fluids, monitor intake and output -Renal ultrasound within normal limits -Continue to monitor BMP  Bacteremia -Blood cultures from 12/31/16: MSSA -Infectious disease consulted and appreciated -Echocardiogram ordered -Antibiotics changed to daptomycin  -Repeat blood cultures pending  Right Hip infection; hardware -Per primary, orthopedics, s/p IRRIGATION AND DEBRIDEMENT RIGHT TOTAL HIP WITH HEAD AND LINER EXCHANGE AND PLACEMENT OF STIMULIN BEADS (Right) -Was on Cipro, Flagyl, vancomycin per pharmacy- transitioned to daptomycin  Hyponatremia -Patient has had mild hyponatremia since 2011, as low as 127. His baseline appears to be approximately 129-133 -Placed on IV fluids, currently sodium is improving to 132 today -Continue to monitor  Essential hypertension -Coreg, Norvasc and Diovan held  Hypotension -Patient had hypotension after surgery on 3/21. Was given 2uPRBCs as well as fluid bolus -BP stable now. Continue IVF -Discontinued coreg -Continue to monitor  Constipation -Continue MiraLAX and senna  DVT Prophylaxis  Lovenox  Code Status: Full  Family Communication: None at bedside  Disposition Plan: Per primary  Consultants TRH consulted  Procedures  Renal US IRRIGATION AND DEBRIDEMENT RIGHT TOTAL HIP WITH HEAD AND LINER EXCHANGE AND PLACEMENT OF STIMULIN BEADS (Right)  Antibiotics   Anti-infectives    Start     Dose/Rate Route Frequency Ordered Stop   01/01/17 2100  vancomycin  (VANCOCIN) 1,250 mg in sodium chloride 0.9 % 250 mL IVPB  Status:  Discontinued     1,250 mg 166.7 mL/hr over 90 Minutes Intravenous Every 24 hours 12/31/16 2020 01/01/17 1542   01/01/17 1700  DAPTOmycin (CUBICIN) 800 mg in sodium chloride 0.9 % IVPB     800 mg 232 mL/hr over 30 Minutes Intravenous Every 24 hours 01/01/17 1605     01/01/17 0600  ciprofloxacin (CIPRO) IVPB 400 mg  Status:  Discontinued     400 mg 200 mL/hr over 60 Minutes Intravenous Every 12 hours 12/31/16 1806 01/01/17 1542   12/31/16 1700  metroNIDAZOLE (FLAGYL) IVPB 500 mg  Status:  Discontinued     500 mg 100 mL/hr over 60 Minutes Intravenous Every 8 hours 12/31/16 1551 01/01/17 1542   12/31/16 1600  ciprofloxacin (CIPRO) IVPB 400 mg     400 mg 200 mL/hr over 60 Minutes Intravenous STAT 12/31/16 1558 12/31/16 1901   12/31/16 1600  vancomycin (VANCOCIN) 2,000 mg in sodium chloride 0.9 % 500 mL IVPB     2,000 mg 250 mL/hr over 120 Minutes Intravenous STAT 12/31/16 1559 01/01/17 0004   12/31/16 1415  vancomycin (VANCOCIN) 1,000 mg in sodium chloride 0.9 % 250 mL IVPB  Status:  Discontinued     1,000 mg 250 mL/hr over 60 Minutes Intravenous  Once 12/31/16 1407 12/31/16 1408   12/31/16 1408  vancomycin (VANCOCIN) IVPB 1000 mg/200 mL premix  Status:  Discontinued     1,000 mg 200 mL/hr over 60 Minutes Intravenous 60 min pre-op 12/31/16 1408 12/31/16 1536   12/31/16 1408  vancomycin (VANCOCIN) 1-5 GM/200ML-% IVPB  Status:  Discontinued    Comments:  Harvell, Gwendolyn  : cabinet override      12/31/16 1408 12/31/16  1558      Subjective:   [name] seen and examined today.  Patient feels pain is improving.  Denies current chest pain, shortness of breath, abdominal pain, nausea or vomiting.  Objective:   Vitals:   01/02/17 0800 01/02/17 0900 01/02/17 1005 01/02/17 1035  BP: (!) 121/49 93/68 (!) 86/46 (!) 113/50  Pulse: 81 84 84 87  Resp: 15 19 19 19   Temp:      TempSrc:      SpO2: 100% 100% 99% 99%  Weight:       Height:        Intake/Output Summary (Last 24 hours) at 01/02/17 1138 Last data filed at 01/02/17 1000  Gross per 24 hour  Intake             5898 ml  Output             5750 ml  Net              148 ml   Filed Weights   12/31/16 1727 01/01/17 0528 01/02/17 0500  Weight: 109.3 kg (241 lb) 110.2 kg (242 lb 15.2 oz) 112 kg (246 lb 14.6 oz)    Exam  General: Well developed, well nourished, NAD, appears stated age  HEENT: NCAT, mucous membranes moist.   Cardiovascular: S1 S2 auscultated, no rubs, murmurs or gallops. Regular rate and rhythm.  Respiratory: Clear to auscultation bilaterally with equal chest rise  Abdomen: Soft, nontender, nondistended, + bowel sounds  Extremities: warm dry without cyanosis clubbing or edema  Neuro: AAOx3, Nonfocal   Skin: Right hip edema, mild erythema  Psych: Normal affect and demeanor    Data Reviewed: I have personally reviewed following labs and imaging studies  CBC:  Recent Labs Lab 12/31/16 1136 01/01/17 0511 01/02/17 0334  WBC 10.6* 10.6* 13.9*  HGB 11.2* 10.2* 10.6*  HCT 30.8* 28.2* 30.5*  MCV 92.2 93.4 91.0  PLT 211 199 749   Basic Metabolic Panel:  Recent Labs Lab 12/31/16 1136 12/31/16 1510 01/01/17 0511 01/02/17 0334  NA 121* 122* 127* 132*  K 4.1 4.0 3.9 3.9  CL 90* 91* 98* 105  CO2 19* 20* 19* 15*  GLUCOSE 123* 111* 110* 115*  BUN 33* 31* 31* 21*  CREATININE 1.82* 1.62* 1.58* 1.16  CALCIUM 9.0 9.2 8.5* 8.4*   GFR: Estimated Creatinine Clearance: 84 mL/min (by C-G formula based on SCr of 1.16 mg/dL). Liver Function Tests:  Recent Labs Lab 12/31/16 1136  AST 44*  ALT 28  ALKPHOS 56  BILITOT 1.0  PROT 7.1  ALBUMIN 3.3*   No results for input(s): LIPASE, AMYLASE in the last 168 hours. No results for input(s): AMMONIA in the last 168 hours. Coagulation Profile: No results for input(s): INR, PROTIME in the last 168 hours. Cardiac Enzymes:  Recent Labs Lab 01/01/17 1551 01/02/17 0334    CKTOTAL 479* 391   BNP (last 3 results) No results for input(s): PROBNP in the last 8760 hours. HbA1C: No results for input(s): HGBA1C in the last 72 hours. CBG: No results for input(s): GLUCAP in the last 168 hours. Lipid Profile: No results for input(s): CHOL, HDL, LDLCALC, TRIG, CHOLHDL, LDLDIRECT in the last 72 hours. Thyroid Function Tests: No results for input(s): TSH, T4TOTAL, FREET4, T3FREE, THYROIDAB in the last 72 hours. Anemia Panel: No results for input(s): VITAMINB12, FOLATE, FERRITIN, TIBC, IRON, RETICCTPCT in the last 72 hours. Urine analysis:    Component Value Date/Time   COLORURINE YELLOW 01/02/2017 1017   APPEARANCEUR  CLEAR 01/02/2017 1017   LABSPEC 1.008 01/02/2017 1017   PHURINE 5.0 01/02/2017 1017   GLUCOSEU NEGATIVE 01/02/2017 1017   HGBUR MODERATE (A) 01/02/2017 1017   BILIRUBINUR NEGATIVE 01/02/2017 1017   KETONESUR 5 (A) 01/02/2017 1017   PROTEINUR NEGATIVE 01/02/2017 1017   UROBILINOGEN 0.2 07/10/2015 1030   NITRITE NEGATIVE 01/02/2017 1017   LEUKOCYTESUR NEGATIVE 01/02/2017 1017   Sepsis Labs: @LABRCNTIP (procalcitonin:4,lacticidven:4)  ) Recent Results (from the past 240 hour(s))  Culture, blood (routine x 2)     Status: Abnormal (Preliminary result)   Collection Time: 12/31/16  5:35 PM  Result Value Ref Range Status   Specimen Description BLOOD BLOOD RIGHT ARM  Final   Special Requests BOTTLES DRAWN AEROBIC AND ANAEROBIC 7 CC EA  Final   Culture  Setup Time   Final    GRAM POSITIVE COCCI IN CLUSTERS IN BOTH AEROBIC AND ANAEROBIC BOTTLES CRITICAL RESULT CALLED TO, READ BACK BY AND VERIFIED WITH: EBurman Foster.D. 12:25 01/01/17 ([email or web address])    Culture (A)  Final    STAPHYLOCOCCUS AUREUS SUSCEPTIBILITIES TO FOLLOW Performed at [facility], [place] [address] [place], [place] [address]    Report Status PENDING  Incomplete  Blood Culture ID Panel (Reflexed)     Status: Abnormal   Collection Time: 12/31/16  5:35 PM  Result Value Ref  Range Status   Enterococcus species NOT DETECTED NOT DETECTED Final   Listeria monocytogenes NOT DETECTED NOT DETECTED Final   Staphylococcus species DETECTED (A) NOT DETECTED Final    Comment: CRITICAL RESULT CALLED TO, READ BACK BY AND VERIFIED WITH: EBurman Foster.D. 12:25 01/01/17 ([email or web address])    Staphylococcus aureus DETECTED (A) NOT DETECTED Final    Comment: Methicillin (oxacillin) susceptible Staphylococcus aureus (MSSA). Preferred therapy is anti staphylococcal beta lactam antibiotic (Cefazolin or Nafcillin), unless clinically contraindicated. CRITICAL RESULT CALLED TO, READ BACK BY AND VERIFIED WITH: EBurman Foster.D. 12:25 01/01/17 ([email or web address])    Methicillin resistance NOT DETECTED NOT DETECTED Final   Streptococcus species NOT DETECTED NOT DETECTED Final   Streptococcus agalactiae NOT DETECTED NOT DETECTED Final   Streptococcus pneumoniae NOT DETECTED NOT DETECTED Final   Streptococcus pyogenes NOT DETECTED NOT DETECTED Final   Acinetobacter baumannii NOT DETECTED NOT DETECTED Final   Enterobacteriaceae species NOT DETECTED NOT DETECTED Final   Enterobacter cloacae complex NOT DETECTED NOT DETECTED Final   Escherichia coli NOT DETECTED NOT DETECTED Final   Klebsiella oxytoca NOT DETECTED NOT DETECTED Final   Klebsiella pneumoniae NOT DETECTED NOT DETECTED Final   Proteus species NOT DETECTED NOT DETECTED Final   Serratia marcescens NOT DETECTED NOT DETECTED Final   Haemophilus influenzae NOT DETECTED NOT DETECTED Final   Neisseria meningitidis NOT DETECTED NOT DETECTED Final   Pseudomonas aeruginosa NOT DETECTED NOT DETECTED Final   Candida albicans NOT DETECTED NOT DETECTED Final   Candida glabrata NOT DETECTED NOT DETECTED Final   Candida krusei NOT DETECTED NOT DETECTED Final   Candida parapsilosis NOT DETECTED NOT DETECTED Final   Candida tropicalis NOT DETECTED NOT DETECTED Final    Comment: Performed at [facility], [address], [place], [place] [address]    Culture, blood (routine x 2)     Status: Abnormal (Preliminary result)   Collection Time: 12/31/16  5:45 PM  Result Value Ref Range Status   Specimen Description BLOOD BLOOD RIGHT ARM  Final   Special Requests BOTTLES DRAWN AEROBIC AND ANAEROBIC 5.5 CC EA  Final   Culture  Setup Time  Final    GRAM POSITIVE COCCI IN BOTH AEROBIC AND ANAEROBIC BOTTLES CRITICAL VALUE NOTED.  VALUE IS CONSISTENT WITH PREVIOUSLY REPORTED AND CALLED VALUE. Performed at [facility], [place] [address], [place], [place] [address]    Culture STAPHYLOCOCCUS AUREUS (A)  Final   Report Status PENDING  Incomplete  Surgical PCR screen     Status: Abnormal   Collection Time: 01/01/17  2:11 PM  Result Value Ref Range Status   MRSA, PCR NEGATIVE NEGATIVE Final   Staphylococcus aureus POSITIVE (A) NEGATIVE Final    Comment:        The Xpert SA Assay (FDA approved for NASAL specimens in patients over 79 years of age), is one component of a comprehensive surveillance program.  Test performance has been validated by [facility] for patients greater than or equal to 100 year old. It is not intended to diagnose infection nor to guide or monitor treatment.   Aerobic/Anaerobic Culture (surgical/deep wound)     Status: None (Preliminary result)   Collection Time: 01/01/17  6:51 PM  Result Value Ref Range Status   Specimen Description SYNOVIAL RIGHT HIP  Final   Special Requests FLUID ON SWAB  Final   Gram Stain   Final    ABUNDANT WBC PRESENT,BOTH PMN AND MONONUCLEAR FEW GRAM POSITIVE COCCI IN PAIRS Performed at [facility], [place] [address] [place],  [address]    Culture PENDING  Incomplete   Report Status PENDING  Incomplete  MRSA PCR Screening     Status: None   Collection Time: 01/01/17 10:16 PM  Result Value Ref Range Status   MRSA by PCR NEGATIVE NEGATIVE Final    Comment:        The GeneXpert MRSA Assay (FDA approved for NASAL specimens only), is one component of a comprehensive  MRSA colonization surveillance program. It is not intended to diagnose MRSA infection nor to guide or monitor treatment for MRSA infections.       Radiology Studies: US Renal  Result Date: 01/01/2017 CLINICAL DATA:  Acute kidney injury EXAM: RENAL / URINARY TRACT ULTRASOUND COMPLETE COMPARISON:  None. FINDINGS: Right Kidney: Length: 13.6 cm. Echogenicity within normal limits. No mass or hydronephrosis visualized. Left Kidney: Length: 14.2 cm. Echogenicity within normal limits. No mass or hydronephrosis visualized. Bladder: Appears normal for degree of bladder distention. IMPRESSION: Within normal limits. Electronically Signed   By: Marybelle Killings M.D.   On: 01/01/2017 08:56   Dg Chest Port 1 View  Result Date: 12/31/2016 CLINICAL DATA:  Preop. EXAM: PORTABLE CHEST 1 VIEW COMPARISON:  05/08/2010. FINDINGS: Low lung volumes. Normal cardiomediastinal silhouette. No active infiltrates or failure. No effusion or pneumothorax. IMPRESSION: Low lung volumes, otherwise unremarkable chest. Electronically Signed   By: Staci Righter M.D.   On: 12/31/2016 15:53     Scheduled Meds: . sodium chloride   Intravenous Once  . celecoxib  200 mg Oral Q12H  . chlorhexidine  60 mL Topical Once  . DAPTOmycin (CUBICIN)  IV  800 mg Intravenous Q24H  . dexamethasone  10 mg Intravenous Once  . docusate sodium  100 mg Oral BID  . enoxaparin (LOVENOX) injection  55 mg Subcutaneous Q24H  . ferrous sulfate  325 mg Oral TID PC  . multivitamin with minerals  1 tablet Oral Daily  . polyethylene glycol  17 g Oral BID  . senna  1 tablet Oral BID  . [START ON 01/04/2017] Vitamin D (Ergocalciferol)  50,000 Units Oral Q Sat  Continuous Infusions: . sodium chloride 100 mL/hr at 01/02/17 1000     LOS: 2 days   Time Spent in minutes   30 minutes  Janei Scheff D.O. on 01/02/2017 at 11:38 AM  Between 7am to 7pm - Pager - [phone]  After 7pm go to [email or web address] - password [email or web address]  And look for the night  coverage person covering for me after hours  [facility] Office  [phone]

## 2017-01-02 NOTE — Evaluation (Signed)
Occupational Therapy Evaluation Patient Details Name: [name] MRN: [id number] DOB: [birth year] Today's Date: 01/02/2017    History of Present Illness 56 year old man s/p R hip I & D and liner exchange.  H/o previous hip sxs bilaterally   Clinical Impression   Pt was admitted for the above.  He was mod I prior to admission, and he will benefit from continued OT to reinforce THPs and activity tolerance for toileting.      Follow Up Recommendations  Supervision/Assistance - 24 hour    Equipment Recommendations  None recommended by OT    Recommendations for Other Services       Precautions / Restrictions Precautions Precautions: Fall;Posterior Hip Restrictions RUE Weight Bearing: Weight bearing as tolerated      Mobility Bed Mobility Overal bed mobility: Needs Assistance Bed Mobility: Supine to Sit;Sit to Supine     Supine to sit: Min assist Sit to supine: Mod assist   General bed mobility comments: assist for RLE out of bed; assist for bil LEs back to bed and cues for sequence, THPs  Transfers Overall transfer level: Needs assistance Equipment used: Rolling walker (2 wheeled) Transfers: Sit to/from Stand Sit to Stand: Min assist         General transfer comment: cues for UE/LE placement and THPS    Balance                                            ADL Overall ADL's : Needs assistance/impaired Eating/Feeding: Independent   Grooming: Set up;Sitting   Upper Body Bathing: Set up;Sitting   Lower Body Bathing: Minimal assistance;Sit to/from stand;With adaptive equipment   Upper Body Dressing : Set up;Sitting   Lower Body Dressing: Minimal assistance;With adaptive equipment;Sit to/from stand   Toilet Transfer: Minimal assistance;Ambulation Toilet Transfer Details (indicate cue type and reason): back to bed Toileting- Clothing Manipulation and Hygiene: Minimal assistance;Sit to/from stand         General ADL Comments: pt ambulated  to door.  Tech came to do 2D ECHO, so returned to bed.  Pt with decreased endurance, and he tends to hold his breath     Vision         Perception     Praxis      Pertinent Vitals/Pain Pain Assessment: 0-10 Pain Score: 9  Pain Location: R hip with weight bearing Pain Descriptors / Indicators: Aching Pain Intervention(s): Limited activity within patient's tolerance;Repositioned;Monitored during session;Premedicated before session;Ice applied     Hand Dominance     Extremity/Trunk Assessment Upper Extremity Assessment Upper Extremity Assessment: Overall WFL for tasks assessed           Communication Communication Communication: No difficulties   Cognition Arousal/Alertness: Awake/alert Behavior During Therapy: WFL for tasks assessed/performed Overall Cognitive Status: Within Functional Limits for tasks assessed                     General Comments       Exercises       Shoulder Instructions      Home Living Family/patient expects to be discharged to:: Private residence Living Arrangements: Spouse/significant other Available Help at Discharge: Family;Available 24 hours/day Type of Home: House Home Access: Stairs to enter [employer] of Steps: 1   Home Layout: Able to live on main level with bedroom/bathroom;Two level     Bathroom Shower/Tub: Tub/shower unit  Bathroom Toilet: Handicapped height     Home Equipment: Environmental consultant - 2 wheels;Adaptive equipment;Crutches;Bedside commode Adaptive Equipment: Sock aid;Reacher;Long-handled shoe horn Additional Comments: was standing in shower      Prior Functioning/Environment Level of Independence: Independent with assistive device(s)        Comments: using crutches        OT Problem List: Decreased strength;Decreased activity tolerance;Pain;Decreased knowledge of precautions      OT Treatment/Interventions: Self-care/ADL training;DME and/or AE instruction;Patient/family education     OT Goals(Current goals can be found in the care plan section) Acute Rehab OT Goals Patient Stated Goal: decreased pain and return to independence OT Goal Formulation: With patient Time For Goal Achievement: 01/09/17 Potential to Achieve Goals: Good ADL Goals Pt Will Transfer to Toilet: ambulating;bedside commode;with supervision Additional ADL Goal #1: pt will not need any cues for posterior THPs during adls/functional mobility tasks  OT Frequency: Min 2X/week   Barriers to D/C:            Co-evaluation PT/OT/SLP Co-Evaluation/Treatment: Yes Reason for Co-Treatment: For patient/therapist safety PT goals addressed during session: Mobility/safety with mobility OT goals addressed during session: ADL's and self-care      End of Session    Activity Tolerance: Patient limited by fatigue Patient left: in bed;with call bell/phone within reach  OT Visit Diagnosis: Pain Pain - Right/Left: Right Pain - part of body: Hip                ADL either performed or assessed with clinical judgement  Time: 1506-1530 OT Time Calculation (min): 24 min Charges:  OT General Charges $OT Visit: 1 Procedure OT Evaluation $OT Eval Low Complexity: 1 Procedure G-Codes:     Lesle Chris, OTR/L [phone] 01/02/2017  [name] 01/02/2017, 3:55 PM

## 2017-01-03 DIAGNOSIS — I959 Hypotension, unspecified: Secondary | ICD-10-CM

## 2017-01-03 DIAGNOSIS — M868X8 Other osteomyelitis, other site: Secondary | ICD-10-CM

## 2017-01-03 DIAGNOSIS — R7881 Bacteremia: Secondary | ICD-10-CM

## 2017-01-03 DIAGNOSIS — B9689 Other specified bacterial agents as the cause of diseases classified elsewhere: Secondary | ICD-10-CM

## 2017-01-03 LAB — CBC
HCT: 30 % — ABNORMAL LOW (ref 39.0–52.0)
Hemoglobin: 10.7 g/dL — ABNORMAL LOW (ref 13.0–17.0)
MCH: 32.5 pg (ref 26.0–34.0)
MCHC: 35.7 g/dL (ref 30.0–36.0)
MCV: 91.2 fL (ref 78.0–100.0)
PLATELETS: 250 10*3/uL (ref 150–400)
RBC: 3.29 MIL/uL — ABNORMAL LOW (ref 4.22–5.81)
RDW: 15.5 % (ref 11.5–15.5)
WBC: 15.1 10*3/uL — AB (ref 4.0–10.5)

## 2017-01-03 LAB — BASIC METABOLIC PANEL
ANION GAP: 8 (ref 5–15)
BUN: 16 mg/dL (ref 6–20)
CO2: 19 mmol/L — ABNORMAL LOW (ref 22–32)
Calcium: 8.9 mg/dL (ref 8.9–10.3)
Chloride: 108 mmol/L (ref 101–111)
Creatinine, Ser: 1.06 mg/dL (ref 0.61–1.24)
GFR calc Af Amer: >60 mL/min (ref >60)
GFR calc non Af Amer: >60 mL/min (ref >60)
GLUCOSE: 235 mg/dL — AB (ref 65–99)
Potassium: 3.6 mmol/L (ref 3.5–5.1)
Sodium: 135 mmol/L (ref 135–145)

## 2017-01-03 LAB — CULTURE, BLOOD (ROUTINE X 2)

## 2017-01-03 LAB — HIV ANTIBODY (ROUTINE TESTING W REFLEX): HIV Screen 4th Generation wRfx: NONREACTIVE

## 2017-01-03 MED ORDER — MUPIROCIN 2 % EX OINT
TOPICAL_OINTMENT | Freq: Two times a day (BID) | CUTANEOUS | Status: DC
Start: 2017-01-03 — End: 2017-01-09
  Administered 2017-01-03: 1 via NASAL
  Administered 2017-01-03 – 2017-01-05 (×4): via NASAL
  Administered 2017-01-06: 1 via NASAL
  Administered 2017-01-06 – 2017-01-07 (×2): via NASAL
  Administered 2017-01-07: 1 via NASAL
  Administered 2017-01-08 – 2017-01-09 (×2): via NASAL
  Filled 2017-01-03 (×2): qty 22

## 2017-01-03 MED ORDER — RIFAMPIN 300 MG PO CAPS
300.0000 mg | ORAL_CAPSULE | Freq: Every day | ORAL | Status: DC
  Administered 2017-01-03 – 2017-01-09 (×7): 300 mg via ORAL
  Filled 2017-01-03 (×7): qty 1

## 2017-01-03 NOTE — Progress Notes (Signed)
Pharmacy Antibiotic Note  [name] is a 56 y.o. male admitted on 12/31/2016 with MSSA R THR infection, osteomyelitis, bacteremia.  TTE negative for obvious vegetations, planning TEE.  S/p I&D and poly exchange, placement of beads 3/21. Possible repeat washout 3/26 per ortho note.  ID following and recommends PICC placement once repeat blood cultures negative.  Pharmacy has been consulted for Daptomycin dosing.  Patient has allergy to cephalosporins associated with facial swelling.  ID added rifampin today.  Today is day #4 of antibiotics.  AKI resolved.  WBC remains elevated.  Baseline CK elevated prior to daptomycin administration and decreased to WNL range.  Of note, appears daptomycin dose was missed on 3/21 while patient was in transit to procedural area.  Plan: Continue daptomycin 800 mg IV q24h.  Will repeat CK on Sunday then weekly if remains in normal range.  Height: 5\' 7"  (170.2 cm) Weight: 247 lb 5.7 oz (112.2 kg) IBW/kg (Calculated) : 66.1  Temp (24hrs), Avg:97.9 F (36.6 C), Min:97.5 F (36.4 C), Max:98.3 F (36.8 C)   Recent Labs Lab 12/31/16 1136 12/31/16 1510 01/01/17 0511 01/02/17 0334 01/03/17 0329  WBC 10.6*  --  10.6* 13.9* 15.1*  CREATININE 1.82* 1.62* 1.58* 1.16 1.06    Estimated Creatinine Clearance: 91.9 mL/min (by C-G formula based on SCr of 1.06 mg/dL).    Allergies  Allergen Reactions  . Cephalexin Swelling    Facial swelling after po kelfex.   . Ace Inhibitors Rash  . Doxycycline Rash    Antimicrobials this admission:  3/20 vanc >>  3/21 3/20 cipro >>  3/21 3/20 flagyl >>3/21 3/21 dapto>> 3/23 rifampin >>  Dose adjustments this admission:   Microbiology results:  3/20 BCx: 2/2 (both bottles) MSSA 3/21 hip aspirate: MSSA 3/22 BCx: IP  Thank you for allowing pharmacy to be a part of this patient's care.  [name] 01/03/2017 8:42 AM

## 2017-01-03 NOTE — Progress Notes (Signed)
INFECTIOUS DISEASE PROGRESS NOTE  ID: [name] is a 56 y.o. male with  Active Problems:   Essential hypertension   Acute hyponatremia   Acute renal disease   Prosthetic hip infection (HCC)   S/P hip replacement  Subjective: Without complaints, looking forward to moving to regular floor.   Abtx:  Anti-infectives    Start     Dose/Rate Route Frequency Ordered Stop   01/01/17 2100  vancomycin (VANCOCIN) 1,250 mg in sodium chloride 0.9 % 250 mL IVPB  Status:  Discontinued     1,250 mg 166.7 mL/hr over 90 Minutes Intravenous Every 24 hours 12/31/16 2020 01/01/17 1542   01/01/17 1700  DAPTOmycin (CUBICIN) 800 mg in sodium chloride 0.9 % IVPB     800 mg 232 mL/hr over 30 Minutes Intravenous Every 24 hours 01/01/17 1605     01/01/17 0600  ciprofloxacin (CIPRO) IVPB 400 mg  Status:  Discontinued     400 mg 200 mL/hr over 60 Minutes Intravenous Every 12 hours 12/31/16 1806 01/01/17 1542   12/31/16 1700  metroNIDAZOLE (FLAGYL) IVPB 500 mg  Status:  Discontinued     500 mg 100 mL/hr over 60 Minutes Intravenous Every 8 hours 12/31/16 1551 01/01/17 1542   12/31/16 1600  ciprofloxacin (CIPRO) IVPB 400 mg     400 mg 200 mL/hr over 60 Minutes Intravenous STAT 12/31/16 1558 12/31/16 1901   12/31/16 1600  vancomycin (VANCOCIN) 2,000 mg in sodium chloride 0.9 % 500 mL IVPB     2,000 mg 250 mL/hr over 120 Minutes Intravenous STAT 12/31/16 1559 01/01/17 0004   12/31/16 1415  vancomycin (VANCOCIN) 1,000 mg in sodium chloride 0.9 % 250 mL IVPB  Status:  Discontinued     1,000 mg 250 mL/hr over 60 Minutes Intravenous  Once 12/31/16 1407 12/31/16 1408   12/31/16 1408  vancomycin (VANCOCIN) IVPB 1000 mg/200 mL premix  Status:  Discontinued     1,000 mg 200 mL/hr over 60 Minutes Intravenous 60 min pre-op 12/31/16 1408 12/31/16 1536   12/31/16 1408  vancomycin (VANCOCIN) 1-5 GM/200ML-% IVPB  Status:  Discontinued    Comments:  Harvell, Gwendolyn  : cabinet override      12/31/16 1408 12/31/16  1558      Medications:  Scheduled: . sodium chloride   Intravenous Once  . celecoxib  200 mg Oral Q12H  . chlorhexidine  60 mL Topical Once  . DAPTOmycin (CUBICIN)  IV  800 mg Intravenous Q24H  . docusate sodium  100 mg Oral BID  . enoxaparin (LOVENOX) injection  55 mg Subcutaneous Q24H  . ferrous sulfate  325 mg Oral TID PC  . multivitamin with minerals  1 tablet Oral Daily  . mupirocin ointment   Nasal BID  . polyethylene glycol  17 g Oral BID  . senna  1 tablet Oral BID  . [START ON 01/04/2017] Vitamin D (Ergocalciferol)  50,000 Units Oral Q Sat    Objective: Vital signs in last 24 hours: Temp:  [97.5 F (36.4 C)-98.3 F (36.8 C)] 97.5 F (36.4 C) (03/23 0312) Pulse Rate:  [70-89] 70 (03/23 0600) Resp:  [14-21] 15 (03/23 0600) BP: (86-154)/(31-82) 144/65 (03/23 0600) SpO2:  [94 %-100 %] 99 % (03/23 0600) Weight:  [112.2 kg (247 lb 5.7 oz)] 112.2 kg (247 lb 5.7 oz) (03/23 0500)   General appearance: alert, cooperative and no distress Resp: clear to auscultation bilaterally Cardio: regular rate and rhythm GI: normal findings: bowel sounds normal and soft, non-tender Incision/Wound: dressed, clean,  edematous.   Lab Results  Recent Labs  01/02/17 0334 01/03/17 0329  WBC 13.9* 15.1*  HGB 10.6* 10.7*  HCT 30.5* 30.0*  NA 132* 135  K 3.9 3.6  CL 105 108  CO2 15* 19*  BUN 21* 16  CREATININE 1.16 1.06   Liver Panel  Recent Labs  12/31/16 1136  PROT 7.1  ALBUMIN 3.3*  AST 44*  ALT 28  ALKPHOS 56  BILITOT 1.0   Sedimentation Rate  Recent Labs  01/01/17 1551  ESRSEDRATE 118*   C-Reactive Protein  Recent Labs  01/01/17 1551  CRP 29.7*    Microbiology: Recent Results (from the past 240 hour(s))  Culture, blood (routine x 2)     Status: Abnormal   Collection Time: 12/31/16  5:35 PM  Result Value Ref Range Status   Specimen Description BLOOD BLOOD RIGHT ARM  Final   Special Requests BOTTLES DRAWN AEROBIC AND ANAEROBIC 7 CC EA  Final   Culture   Setup Time   Final    GRAM POSITIVE COCCI IN CLUSTERS IN BOTH AEROBIC AND ANAEROBIC BOTTLES CRITICAL RESULT CALLED TO, READ BACK BY AND VERIFIED WITH: EBurman Foster.D. 12:25 01/01/17 ([email or web address]) Performed at [facility], [place] [address] [place], [place] [address]    Culture STAPHYLOCOCCUS AUREUS (A)  Final   Report Status 01/03/2017 FINAL  Final   Organism ID, Bacteria STAPHYLOCOCCUS AUREUS  Final      Susceptibility   Staphylococcus aureus - MIC*    CIPROFLOXACIN <=0.5 SENSITIVE Sensitive     ERYTHROMYCIN <=0.25 SENSITIVE Sensitive     GENTAMICIN <=0.5 SENSITIVE Sensitive     OXACILLIN 0.5 SENSITIVE Sensitive     TETRACYCLINE <=1 SENSITIVE Sensitive     VANCOMYCIN 1 SENSITIVE Sensitive     TRIMETH/SULFA <=10 SENSITIVE Sensitive     CLINDAMYCIN <=0.25 SENSITIVE Sensitive     RIFAMPIN <=0.5 SENSITIVE Sensitive     Inducible Clindamycin NEGATIVE Sensitive     * STAPHYLOCOCCUS AUREUS  Blood Culture ID Panel (Reflexed)     Status: Abnormal   Collection Time: 12/31/16  5:35 PM  Result Value Ref Range Status   Enterococcus species NOT DETECTED NOT DETECTED Final   Listeria monocytogenes NOT DETECTED NOT DETECTED Final   Staphylococcus species DETECTED (A) NOT DETECTED Final    Comment: CRITICAL RESULT CALLED TO, READ BACK BY AND VERIFIED WITH: EBurman Foster.D. 12:25 01/01/17 ([email or web address])    Staphylococcus aureus DETECTED (A) NOT DETECTED Final    Comment: Methicillin (oxacillin) susceptible Staphylococcus aureus (MSSA). Preferred therapy is anti staphylococcal beta lactam antibiotic (Cefazolin or Nafcillin), unless clinically contraindicated. CRITICAL RESULT CALLED TO, READ BACK BY AND VERIFIED WITH: EBurman Foster.D. 12:25 01/01/17 ([email or web address])    Methicillin resistance NOT DETECTED NOT DETECTED Final   Streptococcus species NOT DETECTED NOT DETECTED Final   Streptococcus agalactiae NOT DETECTED NOT DETECTED Final   Streptococcus pneumoniae NOT DETECTED NOT DETECTED Final    Streptococcus pyogenes NOT DETECTED NOT DETECTED Final   Acinetobacter baumannii NOT DETECTED NOT DETECTED Final   Enterobacteriaceae species NOT DETECTED NOT DETECTED Final   Enterobacter cloacae complex NOT DETECTED NOT DETECTED Final   Escherichia coli NOT DETECTED NOT DETECTED Final   Klebsiella oxytoca NOT DETECTED NOT DETECTED Final   Klebsiella pneumoniae NOT DETECTED NOT DETECTED Final   Proteus species NOT DETECTED NOT DETECTED Final   Serratia marcescens NOT DETECTED NOT DETECTED Final   Haemophilus influenzae NOT DETECTED NOT DETECTED Final   Neisseria meningitidis NOT DETECTED NOT  DETECTED Final   Pseudomonas aeruginosa NOT DETECTED NOT DETECTED Final   Candida albicans NOT DETECTED NOT DETECTED Final   Candida glabrata NOT DETECTED NOT DETECTED Final   Candida krusei NOT DETECTED NOT DETECTED Final   Candida parapsilosis NOT DETECTED NOT DETECTED Final   Candida tropicalis NOT DETECTED NOT DETECTED Final    Comment: Performed at [facility], [place] [address], [place], [place] [address]  Culture, blood (routine x 2)     Status: Abnormal   Collection Time: 12/31/16  5:45 PM  Result Value Ref Range Status   Specimen Description BLOOD BLOOD RIGHT ARM  Final   Special Requests BOTTLES DRAWN AEROBIC AND ANAEROBIC 5.5 CC EA  Final   Culture  Setup Time   Final    GRAM POSITIVE COCCI IN BOTH AEROBIC AND ANAEROBIC BOTTLES CRITICAL VALUE NOTED.  VALUE IS CONSISTENT WITH PREVIOUSLY REPORTED AND CALLED VALUE.    Culture (A)  Final    STAPHYLOCOCCUS AUREUS SUSCEPTIBILITIES PERFORMED ON PREVIOUS CULTURE WITHIN THE LAST 5 DAYS. Performed at [facility], [place] [address], [place], [place] [address]    Report Status 01/03/2017 FINAL  Final  Surgical PCR screen     Status: Abnormal   Collection Time: 01/01/17  2:11 PM  Result Value Ref Range Status   MRSA, PCR NEGATIVE NEGATIVE Final   Staphylococcus aureus POSITIVE (A) NEGATIVE Final    Comment:        The Xpert SA Assay  (FDA approved for NASAL specimens in patients over 45 years of age), is one component of a comprehensive surveillance program.  Test performance has been validated by [facility] for patients greater than or equal to 52 year old. It is not intended to diagnose infection nor to guide or monitor treatment.   Aerobic/Anaerobic Culture (surgical/deep wound)     Status: None (Preliminary result)   Collection Time: 01/01/17  6:51 PM  Result Value Ref Range Status   Specimen Description SYNOVIAL RIGHT HIP  Final   Special Requests FLUID ON SWAB  Final   Gram Stain   Final    ABUNDANT WBC PRESENT,BOTH PMN AND MONONUCLEAR FEW GRAM POSITIVE COCCI IN PAIRS    Culture   Final    MODERATE STAPHYLOCOCCUS AUREUS SUSCEPTIBILITIES TO FOLLOW Performed at [facility], [place] [address], [place], [place] [address]    Report Status PENDING  Incomplete  MRSA PCR Screening     Status: None   Collection Time: 01/01/17 10:16 PM  Result Value Ref Range Status   MRSA by PCR NEGATIVE NEGATIVE Final    Comment:        The GeneXpert MRSA Assay (FDA approved for NASAL specimens only), is one component of a comprehensive MRSA colonization surveillance program. It is not intended to diagnose MRSA infection nor to guide or monitor treatment for MRSA infections.     Studies/Results: US Renal  Result Date: 01/01/2017 CLINICAL DATA:  Acute kidney injury EXAM: RENAL / URINARY TRACT ULTRASOUND COMPLETE COMPARISON:  None. FINDINGS: Right Kidney: Length: 13.6 cm. Echogenicity within normal limits. No mass or hydronephrosis visualized. Left Kidney: Length: 14.2 cm. Echogenicity within normal limits. No mass or hydronephrosis visualized. Bladder: Appears normal for degree of bladder distention. IMPRESSION: Within normal limits. Electronically Signed   By: Marybelle Killings M.D.   On: 01/01/2017 08:56     Assessment/Plan: MSSA bacteremia Check CK 391- normal ( related to his surgery?) Repeat BCx ngtd TTE  (-). Given his ongoing issues  with hypotension, would consider TEE.  Would consider PIC when his repeat BCx are (-)  hypotension Fluid bolus this AM  R THR infection, osteomyelitis Poly-exchange, placement of beads 3-21 For repeat washout on 3-26 Will add rifampin ESR 118, CRP 29.7  HTN Hypotensive this AM    HypoNa resolved  ARF (Cr 0.78 in August 2017) Renal u/s nl hypovolemia, ARB use Cr NL x 2 days.          [name] Infectious Diseases (pager) [phone] [email or web address] 01/03/2017, 8:25 AM  LOS: 3 days

## 2017-01-03 NOTE — Progress Notes (Signed)
     Subjective: 2 Days Post-Op Procedure(s) (LRB): IRRIGATION AND DEBRIDEMENT RIGHT TOTAL HIP WITH HEAD AND LINER EXCHANGE AND PLACEMENT OF STIMULIN BEADS (Right)   Patient reports pain as mild/moderate.  Pain controlled.  No events throughout the night.  BP seems to have stabilized and will transfer to [place].  Discussed with patient that if he still has redness around the right hip and results of the labs that Dr. Alvan Dame may bring him back to the OR Monday to wash out the area again.  If he is clearing up throughout the weekend and good on Monday then we will continue to watch.  Objective:   VITALS:   Vitals:   01/03/17 0525 01/03/17 0600  BP: (!) 154/65 (!) 144/65  Pulse: 76 70  Resp: 16 15  Temp:      Dorsiflexion/Plantar flexion intact Incision: dressing C/D/I  LABS  Recent Labs  01/01/17 0511 01/01/17 1951 01/02/17 0334 01/03/17 0329  HGB 10.2* 8.8* 10.6* 10.7*  HCT 28.2* 26.0* 30.5* 30.0*  WBC 10.6*  --  13.9* 15.1*  PLT 199  --  218 250     Recent Labs  01/01/17 0511 01/01/17 1951 01/02/17 0334 01/03/17 0329  NA 127* 133* 132* 135  K 3.9 3.8 3.9 3.6  BUN 31*  --  21* 16  CREATININE 1.58*  --  1.16 1.06  GLUCOSE 110* 104* 115* 235*     Assessment/Plan: 2 Days Post-Op Procedure(s) (LRB): IRRIGATION AND DEBRIDEMENT RIGHT TOTAL HIP WITH HEAD AND LINER EXCHANGE AND PLACEMENT OF STIMULIN BEADS (Right) Up with therapy  Transfer to [place] both medicine and ID being involved in the patient's care     [place] Pugh. Alejos Reinhardt   PAC  01/03/2017, 8:21 AM

## 2017-01-03 NOTE — Progress Notes (Signed)
qPhysical Therapy Treatment Patient Details Name: [name] MRN: [id number] DOB: 01/29/1961 Today's Date: 01/03/2017    History of Present Illness 56 year old man s/p R hip I & D and liner exchange    PT Comments    The patient is progressing today with ambulation. Planned TEE and return to surgery next week.   Follow Up Recommendations  Home health PT;Supervision/Assistance - 24 hour     Equipment Recommendations  None recommended by PT    Recommendations for Other Services       Precautions / Restrictions Precautions Precautions: Fall;Posterior Hip Restrictions RUE Weight Bearing: Weight bearing as tolerated    Mobility  Bed Mobility   Bed Mobility: Supine to Sit     Supine to sit: Min assist     General bed mobility comments: assist for RLE out of bed;  cues for sequence, THPs  Transfers Overall transfer level: Needs assistance Equipment used: Rolling walker (2 wheeled) Transfers: Sit to/from Stand Sit to Stand: Min guard         General transfer comment: [place]  Ambulation/Gait Ambulation/Gait assistance: Min assist Ambulation Distance (Feet): 20 Feet (x 2)   Gait Pattern/deviations: Step-to pattern;Step-through pattern     General Gait Details: improved gait pattern   Stairs            Wheelchair Mobility    Modified Rankin (Stroke Patients Only)       Balance                                            Cognition Arousal/Alertness: Awake/alert                                            Exercises      General Comments        Pertinent Vitals/Pain Pain Score: 4  Pain Location: R hip with weight bearing Pain Descriptors / Indicators: Aching Pain Intervention(s): Premedicated before session    Home Living                      Prior Function            PT Goals (current goals can now be found in the care plan section) Progress towards PT goals:  Progressing toward goals    Frequency    7X/week      PT Plan Current plan remains appropriate    Co-evaluation             End of Session   Activity Tolerance: Patient tolerated treatment well Patient left: in chair;with call bell/phone within reach Nurse Communication: Mobility status PT Visit Diagnosis: Unsteadiness on feet (R26.81) Pain - Right/Left: Right Pain - part of body: Hip     Time: 1062-6948 PT Time Calculation (min) (ACUTE ONLY): 12 min  Charges:  $Gait Training: 8-22 mins                    G CodesTresa Endo PT [phone] }   Claretha Cooper 01/03/2017, 5:34 PM

## 2017-01-03 NOTE — Consult Note (Signed)
Medical Consultation   [name]  UKG:[id number]  DOB: [birth year]   DOA: 12/31/2016 PCP: No PCP Per Patient   Requesting physician: Dr. Alvan Dame   Reason for consultation:  AKI and hyponatremia   Brief Narrative: [name] is an 56 y.o. male with PMHx of HTN, multiple joint replacement, presented for I&D and polyexchange due to right hip infection. Found to have AKI and hyponatremia. Was started on IVF with good response. Patient went for I&D on 01/01/17, and was found to have MSSA bacteremia. ID was consulted and stated on dapto.   Subjective: Patient seen and examined. Doing well, have no complaints. No acute events overnight. Afebrile. BP improving.    Impression/Recommendations Acute kidney injury - prerenal from sepsis and hypotension and continuation of ARBs -Baseline creatinine appears to be 0.7, creatinine peaked to 1.82 -Currently creatinine 1.06 -Patient was noted to be on Diovan, continue to hold  -Will decrease IVF rate to 50cc/hr - continue to monitor I&Os  -Renal ultrasound normal  -Check BMP in AM  -Ok to transfer to Medsurg floor   Hyponatremia in setting of AKI, some chronic component as well - resolved with IVF  -Patient has had mild hyponatremia since 2011, as low as 127. His baseline appears to be approximately 129-133 -NA 135 today  -Monitor   Bacteremia -Blood cultures from 12/31/16: MSSA -ID recommendations appreciated  -Will need Abx for 6 weeks  -On Dapto per ID  -TEE recommended - cardiology informed - TEE on Monday  -Patient will need a PICC line as well  -Repeat blood cultures 01/02/17 pending   Right Hip infection; hardware -Per primary, orthopedics, s/p IRRIGATION AND DEBRIDEMENT RIGHT TOTAL HIP WITH HEAD AND LINER EXCHANGE AND PLACEMENT OF STIMULIN BEADS  -Was on Cipro, Flagyl, vancomycin per pharmacy- transitioned to daptomycin  Essential hypertension - was hypotensive  -Home medications on hold - Coreg, Norvasc and  Diovan   Hypotension -Patient was hypotensive after surgery on 3/21. Was given 2uPRBCs as well as fluid bolus -BP stable now. Continue IVF -Continue to monitor  Constipation -Continue MiraLAX and senna  Thank you for this consultation.  Our [facility] hospitalist team will follow the patient with you   Review of Systems:  ROS   Past Medical History: Past Medical History:  Diagnosis Date  . Arthritis   . History of blood transfusion   . Hypertension   . Pneumonia    hx of  . PONV (postoperative nausea and vomiting)     Past Surgical History: Past Surgical History:  Procedure Laterality Date  . ANTERIOR HIP REVISION Left 07/17/2015   Procedure: LEFT POSTERIOR  HIP REVISION;  Surgeon: Paralee Cancel, MD;  Location: [facility];  Service: Orthopedics;  Laterality: Left;  . BACK SURGERY     four back surgeries (3 fusions)  . INCISION AND DRAINAGE HIP Right 01/01/2017   Procedure: IRRIGATION AND DEBRIDEMENT RIGHT TOTAL HIP WITH HEAD AND LINER EXCHANGE AND PLACEMENT OF STIMULIN BEADS;  Surgeon: Paralee Cancel, MD;  Location: [facility];  Service: Orthopedics;  Laterality: Right;  . JOINT REPLACEMENT     3 surgeries on right hip, 1 on left hip  . right knee surgery - torn maniscus    . TONSILLECTOMY     adnoids removed  . TOTAL HIP REVISION Right 05/27/2016   Procedure: TOTAL HIP REVISION;  Surgeon: Paralee Cancel, MD;  Location: [facility];  Service: Orthopedics;  Laterality: Right;  Allergies:   Allergies  Allergen Reactions  . Cephalexin Swelling    Facial swelling after po kelfex.   . Ace Inhibitors Rash  . Doxycycline Rash     Social History:  reports that he quit smoking about 12 years ago. He has never used smokeless tobacco. He reports that he drinks about 1.2 oz of alcohol per week . He reports that he does not use drugs.   Family History: Family History  Problem Relation Age of Onset  . COPD Mother   . Alzheimer's disease Father      Physical Exam: Vitals:   01/03/17  0312 01/03/17 0500 01/03/17 0525 01/03/17 0600  BP:   (!) 154/65 (!) 144/65  Pulse:   76 70  Resp:   16 15  Temp: 97.5 F (36.4 C)     TempSrc: Oral     SpO2:   100% 99%  Weight:  112.2 kg (247 lb 5.7 oz)    Height:        Constitutional: Alert and awake, oriented x3, not in any acute distress. CVS: S1-S2 clear, no murmur rubs or gallops, no LE edema, normal pedal pulses  Respiratory:  clear to auscultation bilaterally, no wheezing, rales or rhonchi. Respiratory effort normal. Abdomen: Soft NTND  Psych: Mood appropriate  Skin: R hip dressing clean, dry and intact   Data reviewed:  I have personally reviewed following labs and imaging studies Labs:  CBC:  Recent Labs Lab 12/31/16 1136 12/31/16 1505 01/01/17 0511 01/01/17 1951 01/02/17 0334 01/03/17 0329  WBC 10.6*  --  10.6*  --  13.9* 15.1*  HGB 11.2* 11.2* 10.2* 8.8* 10.6* 10.7*  HCT 30.8* 33.0* 28.2* 26.0* 30.5* 30.0*  MCV 92.2  --  93.4  --  91.0 91.2  PLT 211  --  199  --  218 914    Basic Metabolic Panel:  Recent Labs Lab 12/31/16 1136  12/31/16 1510 01/01/17 0511 01/01/17 1951 01/02/17 0334 01/03/17 0329  NA 121*  < > 122* 127* 133* 132* 135  K 4.1  < > 4.0 3.9 3.8 3.9 3.6  CL 90*  --  91* 98*  --  105 108  CO2 19*  --  20* 19*  --  15* 19*  GLUCOSE 123*  < > 111* 110* 104* 115* 235*  BUN 33*  --  31* 31*  --  21* 16  CREATININE 1.82*  --  1.62* 1.58*  --  1.16 1.06  CALCIUM 9.0  --  9.2 8.5*  --  8.4* 8.9  < > = values in this interval not displayed. GFR Estimated Creatinine Clearance: 91.9 mL/min (by C-G formula based on SCr of 1.06 mg/dL). Liver Function Tests:  Recent Labs Lab 12/31/16 1136  AST 44*  ALT 28  ALKPHOS 56  BILITOT 1.0  PROT 7.1  ALBUMIN 3.3*   No results for input(s): LIPASE, AMYLASE in the last 168 hours. No results for input(s): AMMONIA in the last 168 hours. Coagulation profile No results for input(s): INR, PROTIME in the last 168 hours.  Cardiac Enzymes:  Recent  Labs Lab 01/01/17 1551 01/02/17 0334  CKTOTAL 479* 391   BNP: Invalid input(s): POCBNP CBG: No results for input(s): GLUCAP in the last 168 hours. D-Dimer No results for input(s): DDIMER in the last 72 hours. Hgb A1c No results for input(s): HGBA1C in the last 72 hours. Lipid Profile No results for input(s): CHOL, HDL, LDLCALC, TRIG, CHOLHDL, LDLDIRECT in the last 72 hours. Thyroid function studies  No results for input(s): TSH, T4TOTAL, T3FREE, THYROIDAB in the last 72 hours.  Invalid input(s): FREET3 Anemia work up No results for input(s): VITAMINB12, FOLATE, FERRITIN, TIBC, IRON, RETICCTPCT in the last 72 hours. Urinalysis    Component Value Date/Time   COLORURINE YELLOW 01/02/2017 1017   APPEARANCEUR CLEAR 01/02/2017 1017   LABSPEC 1.008 01/02/2017 1017   PHURINE 5.0 01/02/2017 1017   GLUCOSEU NEGATIVE 01/02/2017 1017   HGBUR MODERATE (A) 01/02/2017 1017   BILIRUBINUR NEGATIVE 01/02/2017 1017   KETONESUR 5 (A) 01/02/2017 1017   PROTEINUR NEGATIVE 01/02/2017 1017   UROBILINOGEN 0.2 07/10/2015 1030   NITRITE NEGATIVE 01/02/2017 [place] 01/02/2017 1017    Microbiology Recent Results (from the past 240 hour(s))  Culture, blood (routine x 2)     Status: Abnormal   Collection Time: 12/31/16  5:35 PM  Result Value Ref Range Status   Specimen Description BLOOD BLOOD RIGHT ARM  Final   Special Requests BOTTLES DRAWN AEROBIC AND ANAEROBIC 7 CC EA  Final   Culture  Setup Time   Final    GRAM POSITIVE COCCI IN CLUSTERS IN BOTH AEROBIC AND ANAEROBIC BOTTLES CRITICAL RESULT CALLED TO, READ BACK BY AND VERIFIED WITH: EBurman Foster.D. 12:25 01/01/17 ([email or web address]) Performed at [facility], [place] [address], [place], [place] [address]    Culture STAPHYLOCOCCUS AUREUS (A)  Final   Report Status 01/03/2017 FINAL  Final   Organism ID, Bacteria STAPHYLOCOCCUS AUREUS  Final      Susceptibility   Staphylococcus aureus - MIC*    CIPROFLOXACIN <=0.5 SENSITIVE  Sensitive     ERYTHROMYCIN <=0.25 SENSITIVE Sensitive     GENTAMICIN <=0.5 SENSITIVE Sensitive     OXACILLIN 0.5 SENSITIVE Sensitive     TETRACYCLINE <=1 SENSITIVE Sensitive     VANCOMYCIN 1 SENSITIVE Sensitive     TRIMETH/SULFA <=10 SENSITIVE Sensitive     CLINDAMYCIN <=0.25 SENSITIVE Sensitive     RIFAMPIN <=0.5 SENSITIVE Sensitive     Inducible Clindamycin NEGATIVE Sensitive     * STAPHYLOCOCCUS AUREUS  Blood Culture ID Panel (Reflexed)     Status: Abnormal   Collection Time: 12/31/16  5:35 PM  Result Value Ref Range Status   Enterococcus species NOT DETECTED NOT DETECTED Final   Listeria monocytogenes NOT DETECTED NOT DETECTED Final   Staphylococcus species DETECTED (A) NOT DETECTED Final    Comment: CRITICAL RESULT CALLED TO, READ BACK BY AND VERIFIED WITH: EBurman Foster.D. 12:25 01/01/17 ([email or web address])    Staphylococcus aureus DETECTED (A) NOT DETECTED Final    Comment: Methicillin (oxacillin) susceptible Staphylococcus aureus (MSSA). Preferred therapy is anti staphylococcal beta lactam antibiotic (Cefazolin or Nafcillin), unless clinically contraindicated. CRITICAL RESULT CALLED TO, READ BACK BY AND VERIFIED WITH: EBurman Foster.D. 12:25 01/01/17 ([email or web address])    Methicillin resistance NOT DETECTED NOT DETECTED Final   Streptococcus species NOT DETECTED NOT DETECTED Final   Streptococcus agalactiae NOT DETECTED NOT DETECTED Final   Streptococcus pneumoniae NOT DETECTED NOT DETECTED Final   Streptococcus pyogenes NOT DETECTED NOT DETECTED Final   Acinetobacter baumannii NOT DETECTED NOT DETECTED Final   Enterobacteriaceae species NOT DETECTED NOT DETECTED Final   Enterobacter cloacae complex NOT DETECTED NOT DETECTED Final   Escherichia coli NOT DETECTED NOT DETECTED Final   Klebsiella oxytoca NOT DETECTED NOT DETECTED Final   Klebsiella pneumoniae NOT DETECTED NOT DETECTED Final   Proteus species NOT DETECTED NOT DETECTED Final   Serratia marcescens NOT DETECTED NOT DETECTED  Final   Haemophilus  influenzae NOT DETECTED NOT DETECTED Final   Neisseria meningitidis NOT DETECTED NOT DETECTED Final   Pseudomonas aeruginosa NOT DETECTED NOT DETECTED Final   Candida albicans NOT DETECTED NOT DETECTED Final   Candida glabrata NOT DETECTED NOT DETECTED Final   Candida krusei NOT DETECTED NOT DETECTED Final   Candida parapsilosis NOT DETECTED NOT DETECTED Final   Candida tropicalis NOT DETECTED NOT DETECTED Final    Comment: Performed at [facility], [place] [address], [place], [place] [address]  Culture, blood (routine x 2)     Status: Abnormal   Collection Time: 12/31/16  5:45 PM  Result Value Ref Range Status   Specimen Description BLOOD BLOOD RIGHT ARM  Final   Special Requests BOTTLES DRAWN AEROBIC AND ANAEROBIC 5.5 CC EA  Final   Culture  Setup Time   Final    GRAM POSITIVE COCCI IN BOTH AEROBIC AND ANAEROBIC BOTTLES CRITICAL VALUE NOTED.  VALUE IS CONSISTENT WITH PREVIOUSLY REPORTED AND CALLED VALUE.    Culture (A)  Final    STAPHYLOCOCCUS AUREUS SUSCEPTIBILITIES PERFORMED ON PREVIOUS CULTURE WITHIN THE LAST 5 DAYS. Performed at [facility], [place] [address] [place], [place] [address]    Report Status 01/03/2017 FINAL  Final  Surgical PCR screen     Status: Abnormal   Collection Time: 01/01/17  2:11 PM  Result Value Ref Range Status   MRSA, PCR NEGATIVE NEGATIVE Final   Staphylococcus aureus POSITIVE (A) NEGATIVE Final    Comment:        The Xpert SA Assay (FDA approved for NASAL specimens in patients over 93 years of age), is one component of a comprehensive surveillance program.  Test performance has been validated by [facility] for patients greater than or equal to 71 year old. It is not intended to diagnose infection nor to guide or monitor treatment.   Aerobic/Anaerobic Culture (surgical/deep wound)     Status: None (Preliminary result)   Collection Time: 01/01/17  6:51 PM  Result Value Ref Range Status   Specimen Description  SYNOVIAL RIGHT HIP  Final   Special Requests FLUID ON SWAB  Final   Gram Stain   Final    ABUNDANT WBC PRESENT,BOTH PMN AND MONONUCLEAR FEW GRAM POSITIVE COCCI IN PAIRS Performed at [facility], [place] [address], [place], [place] [address]    Culture MODERATE STAPHYLOCOCCUS AUREUS  Final   Report Status PENDING  Incomplete   Organism ID, Bacteria STAPHYLOCOCCUS AUREUS  Final      Susceptibility   Staphylococcus aureus - MIC*    CIPROFLOXACIN <=0.5 SENSITIVE Sensitive     ERYTHROMYCIN <=0.25 SENSITIVE Sensitive     GENTAMICIN <=0.5 SENSITIVE Sensitive     OXACILLIN 0.5 SENSITIVE Sensitive     TETRACYCLINE <=1 SENSITIVE Sensitive     VANCOMYCIN <=0.5 SENSITIVE Sensitive     TRIMETH/SULFA <=10 SENSITIVE Sensitive     CLINDAMYCIN <=0.25 SENSITIVE Sensitive     RIFAMPIN <=0.5 SENSITIVE Sensitive     Inducible Clindamycin NEGATIVE Sensitive     * MODERATE STAPHYLOCOCCUS AUREUS  MRSA PCR Screening     Status: None   Collection Time: 01/01/17 10:16 PM  Result Value Ref Range Status   MRSA by PCR NEGATIVE NEGATIVE Final    Comment:        The GeneXpert MRSA Assay (FDA approved for NASAL specimens only), is one component of a comprehensive MRSA colonization surveillance program. It is not intended to diagnose MRSA infection nor to guide or  monitor treatment for MRSA infections.       Inpatient Medications:   Scheduled Meds: . sodium chloride   Intravenous Once  . celecoxib  200 mg Oral Q12H  . chlorhexidine  60 mL Topical Once  . DAPTOmycin (CUBICIN)  IV  800 mg Intravenous Q24H  . docusate sodium  100 mg Oral BID  . enoxaparin (LOVENOX) injection  55 mg Subcutaneous Q24H  . ferrous sulfate  325 mg Oral TID PC  . multivitamin with minerals  1 tablet Oral Daily  . mupirocin ointment   Nasal BID  . polyethylene glycol  17 g Oral BID  . rifampin  300 mg Oral Daily  . senna  1 tablet Oral BID  . [START ON 01/04/2017] Vitamin D (Ergocalciferol)  50,000 Units Oral Q Sat    Continuous Infusions: . sodium chloride 100 mL/hr at 01/03/17 0522     Radiological Exams on Admission: No results found.   Chipper Oman M.D. [facility]  01/03/2017, 8:47 AM  Pager please text page via  [email or web address] Password [email or web address]

## 2017-01-03 NOTE — Progress Notes (Addendum)
    [facility] has been requested to perform a transesophageal echocardiogram on [name] for bacteremia.  After careful review of history and examination, the risks and benefits of transesophageal echocardiogram have been explained including risks of esophageal damage, perforation (1:10,000 risk), bleeding, pharyngeal hematoma as well as other potential complications associated with conscious sedation including aspiration, arrhythmia, respiratory failure and death. Alternatives to treatment were discussed, questions were answered. Patient is willing to proceed.   TEE SCHEDULED FOR 9AM AT [place]. CARELINK NEEDS TO HAVE HER AT [facility] BY 8 AM. I HAVE DICUSSED THIS WITH HER NURSE TODAY WHO WILL MAKE SURE THIS IS ARRANGED. NPO after midnight on Sunday night.   Angelena Form, PA-C  01/03/2017 9:35 AM

## 2017-01-04 ENCOUNTER — Inpatient Hospital Stay (HOSPITAL_COMMUNITY): Payer: Worker's Compensation

## 2017-01-04 DIAGNOSIS — R600 Localized edema: Secondary | ICD-10-CM

## 2017-01-04 DIAGNOSIS — R609 Edema, unspecified: Secondary | ICD-10-CM

## 2017-01-04 NOTE — Progress Notes (Signed)
qPhysical Therapy Treatment Patient Details Name: [name] MRN: [id number] DOB: [birth year] Today's Date: 01/04/2017    History of Present Illness 56 year old man s/p R hip I & D and liner exchange    PT Comments    Provided a leg lifter and instructed in its use for bed mobility and repositioning the right leg. Continue PT.   Follow Up Recommendations  Home health PT;Supervision/Assistance - 24 hour     Equipment Recommendations  None recommended by PT    Recommendations for Other Services       Precautions / Restrictions Precautions Precautions: Fall;Posterior Hip Restrictions RUE Weight Bearing: Weight bearing as tolerated    Mobility  Bed Mobility   Bed Mobility: Supine to Sit     Supine to sit: Min assist     General bed mobility comments: assist for RLE out of bed;  cues for sequence, THPs  Transfers Overall transfer level: Needs assistance Equipment used: Rolling walker (2 wheeled)   Sit to Stand: Min guard         General transfer comment: CUES FOR POSTERIOR PRECAUTIONS  Ambulation/Gait   Ambulation Distance (Feet): 40 Feet Assistive device: Rolling walker (2 wheeled) Gait Pattern/deviations: Step-to pattern;Decreased step length - right;Decreased stance time - right     General Gait Details: reports thigh feels tight.   Stairs            Wheelchair Mobility    Modified Rankin (Stroke Patients Only)       Balance                                            Cognition Arousal/Alertness: Awake/alert                                            Exercises Total Joint Exercises Long Arc Quad: AROM;Right;10 reps;Seated    General Comments        Pertinent Vitals/Pain Pain Score: 3  Pain Location: R hip with weight bearing Pain Descriptors / Indicators: Tightness Pain Intervention(s): Monitored during session;Premedicated before session;Ice applied    Home Living                      Prior Function            PT Goals (current goals can now be found in the care plan section) Progress towards PT goals: Progressing toward goals    Frequency    7X/week      PT Plan Current plan remains appropriate    Co-evaluation             End of Session   Activity Tolerance: Patient tolerated treatment well Patient left: in chair;with call bell/phone within reach Nurse Communication: Mobility status PT Visit Diagnosis: Unsteadiness on feet (R26.81) Pain - Right/Left: Right Pain - part of body: Hip     Time: 5456-2563 PT Time Calculation (min) (ACUTE ONLY): 25 min  Charges:  $Gait Training: 23-37 mins                    G CodesTresa Endo PT [phone] }   Claretha Cooper 01/04/2017, 2:38 PM

## 2017-01-04 NOTE — Progress Notes (Signed)
   Subjective: 3 Days Post-Op Procedure(s) (LRB): IRRIGATION AND DEBRIDEMENT RIGHT TOTAL HIP WITH HEAD AND LINER EXCHANGE AND PLACEMENT OF STIMULIN BEADS (Right)  Pt c/o continued swelling/edema in the right hip Pain is mild currently  Soreness and pain when getting out of bed Patient reports pain as mild.  Objective:   VITALS:   Vitals:   01/03/17 2150 01/04/17 0535  BP: 133/67 (!) 166/85  Pulse: 91 72  Resp: 18 18  Temp: 97.9 F (36.6 C) 97.7 F (36.5 C)    Right hip incision healing well Mild edema to the right thigh as compared to the left No rashes distally  LABS  Recent Labs  01/01/17 1951 01/02/17 0334 01/03/17 0329  HGB 8.8* 10.6* 10.7*  HCT 26.0* 30.5* 30.0*  WBC  --  13.9* 15.1*  PLT  --  218 250     Recent Labs  01/01/17 1951 01/02/17 0334 01/03/17 0329  NA 133* 132* 135  K 3.8 3.9 3.6  BUN  --  21* 16  CREATININE  --  1.16 1.06  GLUCOSE 104* 115* 235*     Assessment/Plan: 3 Days Post-Op Procedure(s) (LRB): IRRIGATION AND DEBRIDEMENT RIGHT TOTAL HIP WITH HEAD AND LINER EXCHANGE AND PLACEMENT OF STIMULIN BEADS (Right) Continue PT/OT Plan for repeat I&D on Monday by Dr. Alvan Dame Pt in agreement Pain management as needed   Merla Riches, [place], PA-C  01/04/2017, 7:25 AM

## 2017-01-04 NOTE — Progress Notes (Signed)
**  Preliminary report by tech**  Right lower extremity venous duplex complete. There is no evidence of deep or superficial vein thrombosis involving the right lower extremity. All visualized vessels appear patent and compressible. There is no evidence of a Baker's cyst on the right. Results were given to the patient's nurse, Ardae.  01/04/17 3:52 PM [name] RVT

## 2017-01-04 NOTE — Progress Notes (Signed)
NCM spoke to pt and his Worker's Comp Clarice Pole # [phone]. Pt states he has RW, and 3n1 bedside commode at home. His SO, Coralyn Mark is at home to assist with care. Contacted [facility] and left message for return call. Will need arrange HHPT. Jonnie Finner RN CCM Case Mgmt phone [phone]

## 2017-01-04 NOTE — Progress Notes (Signed)
PROGRESS NOTE [facility]   [name]   JSE:[id number] DOB: 03-30-1961  DOA: 12/31/2016 PCP: No PCP Per Patient   Brief Narrative:  [name] is an 56 y.o. male with PMHx of HTN, multiple joint replacement, presented for I&D and polyexchange due to right hip infection. Found to have AKI and hyponatremia. Was started on IVF with good response. Patient went for I&D on 01/01/17, and was found to have MSSA bacteremia. ID was consulted and started on dapto. Now patient with R lower extremity edema.   Subjective: Patient seen and examined, c/o R LE swelling. Pain well controlled with medication. Poor mobility on R hip. No other complains at this time. Patient remains afebrile. Kidney function and hyponatremia has resolved.   Assessment & Plan: Impression/Recommendations Acute kidney injury - prerenal from sepsis and hypotension and continuation of ARBs - Improved  -Baseline creatinine appears to be 0.7, creatinine peaked to 1.82 -Patient was noted to be on Diovan, continue to hold  -Will d/c IVF  -Renal ultrasound normal  -Check BMP in AM   Hyponatremia in setting of AKI, some chronic component as well - resolved with IVF  -Patient has had mild hyponatremia since 2011, as low as 127. His baseline appears to be approximately 129-133 -Monitor BMP in AM     MSSA Bacteremia -Blood cultures from 12/31/16: MSSA -ID recommendations appreciated  -Will need Abx for 6 weeks  -On Dapto per ID  -For TEE on Monday  -Need PICC line placement.  -Repeat blood cultures 01/02/17 NGTD  Right Hip infection; hardware, now with R LE edema, no pitting - concern for DVT  -Per primary, orthopedics, s/p IRRIGATION AND DEBRIDEMENT RIGHT TOTAL HIP WITH HEAD AND LINER EXCHANGE AND PLACEMENT OF STIMULIN BEADS  -Was on Cipro, Flagyl, vancomycin per pharmacy- transitioned to daptomycin -Will order Doppler LE   Essential hypertension - was hypotensive  -Home medications on hold - Coreg, Norvasc and  Diovan   Hypotension - resolved, now BP above goal  -Patient was hypotensive after surgery on 3/21. Was given 2uPRBCs as well as fluid bolus -D/c IVF  -Continue to monitor  Constipation -Continue MiraLAX and senna  DVT prophylaxis: Lovenox  Code Status: FULL  Family Communication: None at bedside  Disposition Plan: Per primary team  Thank you for this consultation. Our [facility] hospitalist team will follow the patient with you.    Objective: Vitals:   01/03/17 1315 01/03/17 2150 01/04/17 0500 01/04/17 0535  BP: (!) 152/76 133/67  (!) 166/85  Pulse: 81 91  72  Resp: 18 18  18   Temp: 98.6 F (37 C) 97.9 F (36.6 C)  97.7 F (36.5 C)  TempSrc: Oral Oral  Oral  SpO2: 100% 99%  99%  Weight:   113.4 kg (250 lb)   Height:        Intake/Output Summary (Last 24 hours) at 01/04/17 1317 Last data filed at 01/04/17 0940  Gross per 24 hour  Intake             1076 ml  Output                0 ml  Net             1076 ml   Filed Weights   01/02/17 0500 01/03/17 0500 01/04/17 0500  Weight: 112 kg (246 lb 14.6 oz) 112.2 kg (247 lb 5.7 oz) 113.4 kg (250 lb)    Examination:  General exam: NAD  Respiratory system: Clear to auscultation. No  wheezes,crackle or rhonchi Cardiovascular system: S1 & S2 heard, RRR. No JVD, murmurs, rubs or gallops Central nervous system: Alert and oriented.  Extremities: R LE edematous extending from the hip to the ankle, tenderness only at the knee. Mild erythema at the right thigh  Skin: Dressing in place intact  Psychiatry: Mood & affect appropriate.    Data Reviewed: I have personally reviewed following labs and imaging studies  CBC:  Recent Labs Lab 12/31/16 1136 12/31/16 1505 01/01/17 0511 01/01/17 1951 01/02/17 0334 01/03/17 0329  WBC 10.6*  --  10.6*  --  13.9* 15.1*  HGB 11.2* 11.2* 10.2* 8.8* 10.6* 10.7*  HCT 30.8* 33.0* 28.2* 26.0* 30.5* 30.0*  MCV 92.2  --  93.4  --  91.0 91.2  PLT 211  --  199  --  218 924   Basic Metabolic  Panel:  Recent Labs Lab 12/31/16 1136  12/31/16 1510 01/01/17 0511 01/01/17 1951 01/02/17 0334 01/03/17 0329  NA 121*  < > 122* 127* 133* 132* 135  K 4.1  < > 4.0 3.9 3.8 3.9 3.6  CL 90*  --  91* 98*  --  105 108  CO2 19*  --  20* 19*  --  15* 19*  GLUCOSE 123*  < > 111* 110* 104* 115* 235*  BUN 33*  --  31* 31*  --  21* 16  CREATININE 1.82*  --  1.62* 1.58*  --  1.16 1.06  CALCIUM 9.0  --  9.2 8.5*  --  8.4* 8.9  < > = values in this interval not displayed. GFR: Estimated Creatinine Clearance: 92.4 mL/min (by C-G formula based on SCr of 1.06 mg/dL). Liver Function Tests:  Recent Labs Lab 12/31/16 1136  AST 44*  ALT 28  ALKPHOS 56  BILITOT 1.0  PROT 7.1  ALBUMIN 3.3*   Cardiac Enzymes:  Recent Labs Lab 01/01/17 1551 01/02/17 0334  CKTOTAL 479* 391    Recent Results (from the past 240 hour(s))  Culture, blood (routine x 2)     Status: Abnormal   Collection Time: 12/31/16  5:35 PM  Result Value Ref Range Status   Specimen Description BLOOD BLOOD RIGHT ARM  Final   Special Requests BOTTLES DRAWN AEROBIC AND ANAEROBIC 7 CC EA  Final   Culture  Setup Time   Final    GRAM POSITIVE COCCI IN CLUSTERS IN BOTH AEROBIC AND ANAEROBIC BOTTLES CRITICAL RESULT CALLED TO, READ BACK BY AND VERIFIED WITH: EBurman Foster.D. 12:25 01/01/17 ([email or web address]) Performed at [facility], [place] [address], [place], [place] [address]    Culture STAPHYLOCOCCUS AUREUS (A)  Final   Report Status 01/03/2017 FINAL  Final   Organism ID, Bacteria STAPHYLOCOCCUS AUREUS  Final      Susceptibility   Staphylococcus aureus - MIC*    CIPROFLOXACIN <=0.5 SENSITIVE Sensitive     ERYTHROMYCIN <=0.25 SENSITIVE Sensitive     GENTAMICIN <=0.5 SENSITIVE Sensitive     OXACILLIN 0.5 SENSITIVE Sensitive     TETRACYCLINE <=1 SENSITIVE Sensitive     VANCOMYCIN 1 SENSITIVE Sensitive     TRIMETH/SULFA <=10 SENSITIVE Sensitive     CLINDAMYCIN <=0.25 SENSITIVE Sensitive     RIFAMPIN <=0.5 SENSITIVE  Sensitive     Inducible Clindamycin NEGATIVE Sensitive     * STAPHYLOCOCCUS AUREUS  Blood Culture ID Panel (Reflexed)     Status: Abnormal   Collection Time: 12/31/16  5:35 PM  Result Value Ref Range Status   Enterococcus species NOT DETECTED NOT  DETECTED Final   Listeria monocytogenes NOT DETECTED NOT DETECTED Final   Staphylococcus species DETECTED (A) NOT DETECTED Final    Comment: CRITICAL RESULT CALLED TO, READ BACK BY AND VERIFIED WITH: EBurman Foster.D. 12:25 01/01/17 ([email or web address])    Staphylococcus aureus DETECTED (A) NOT DETECTED Final    Comment: Methicillin (oxacillin) susceptible Staphylococcus aureus (MSSA). Preferred therapy is anti staphylococcal beta lactam antibiotic (Cefazolin or Nafcillin), unless clinically contraindicated. CRITICAL RESULT CALLED TO, READ BACK BY AND VERIFIED WITH: EBurman Foster.D. 12:25 01/01/17 ([email or web address])    Methicillin resistance NOT DETECTED NOT DETECTED Final   Streptococcus species NOT DETECTED NOT DETECTED Final   Streptococcus agalactiae NOT DETECTED NOT DETECTED Final   Streptococcus pneumoniae NOT DETECTED NOT DETECTED Final   Streptococcus pyogenes NOT DETECTED NOT DETECTED Final   Acinetobacter baumannii NOT DETECTED NOT DETECTED Final   Enterobacteriaceae species NOT DETECTED NOT DETECTED Final   Enterobacter cloacae complex NOT DETECTED NOT DETECTED Final   Escherichia coli NOT DETECTED NOT DETECTED Final   Klebsiella oxytoca NOT DETECTED NOT DETECTED Final   Klebsiella pneumoniae NOT DETECTED NOT DETECTED Final   Proteus species NOT DETECTED NOT DETECTED Final   Serratia marcescens NOT DETECTED NOT DETECTED Final   Haemophilus influenzae NOT DETECTED NOT DETECTED Final   Neisseria meningitidis NOT DETECTED NOT DETECTED Final   Pseudomonas aeruginosa NOT DETECTED NOT DETECTED Final   Candida albicans NOT DETECTED NOT DETECTED Final   Candida glabrata NOT DETECTED NOT DETECTED Final   Candida krusei NOT DETECTED NOT DETECTED Final    Candida parapsilosis NOT DETECTED NOT DETECTED Final   Candida tropicalis NOT DETECTED NOT DETECTED Final    Comment: Performed at [facility], [address], [place], [place] [address]  Culture, blood (routine x 2)     Status: Abnormal   Collection Time: 12/31/16  5:45 PM  Result Value Ref Range Status   Specimen Description BLOOD BLOOD RIGHT ARM  Final   Special Requests BOTTLES DRAWN AEROBIC AND ANAEROBIC 5.5 CC EA  Final   Culture  Setup Time   Final    GRAM POSITIVE COCCI IN BOTH AEROBIC AND ANAEROBIC BOTTLES CRITICAL VALUE NOTED.  VALUE IS CONSISTENT WITH PREVIOUSLY REPORTED AND CALLED VALUE.    Culture (A)  Final    STAPHYLOCOCCUS AUREUS SUSCEPTIBILITIES PERFORMED ON PREVIOUS CULTURE WITHIN THE LAST 5 DAYS. Performed at [facility], [place] [address], [place], [place] [address]    Report Status 01/03/2017 FINAL  Final  Surgical PCR screen     Status: Abnormal   Collection Time: 01/01/17  2:11 PM  Result Value Ref Range Status   MRSA, PCR NEGATIVE NEGATIVE Final   Staphylococcus aureus POSITIVE (A) NEGATIVE Final    Comment:        The Xpert SA Assay (FDA approved for NASAL specimens in patients over 67 years of age), is one component of a comprehensive surveillance program.  Test performance has been validated by [facility] for patients greater than or equal to 1 year old. It is not intended to diagnose infection nor to guide or monitor treatment.   Aerobic/Anaerobic Culture (surgical/deep wound)     Status: None (Preliminary result)   Collection Time: 01/01/17  6:51 PM  Result Value Ref Range Status   Specimen Description SYNOVIAL RIGHT HIP  Final   Special Requests FLUID ON SWAB  Final   Gram Stain   Final    ABUNDANT WBC PRESENT,BOTH PMN AND MONONUCLEAR FEW GRAM POSITIVE COCCI IN  PAIRS Performed at [facility], [place] [address] [place], [place] [address]    Culture   Final    MODERATE STAPHYLOCOCCUS AUREUS NO ANAEROBES ISOLATED; CULTURE  IN PROGRESS FOR 5 DAYS    Report Status PENDING  Incomplete   Organism ID, Bacteria STAPHYLOCOCCUS AUREUS  Final      Susceptibility   Staphylococcus aureus - MIC*    CIPROFLOXACIN <=0.5 SENSITIVE Sensitive     ERYTHROMYCIN <=0.25 SENSITIVE Sensitive     GENTAMICIN <=0.5 SENSITIVE Sensitive     OXACILLIN 0.5 SENSITIVE Sensitive     TETRACYCLINE <=1 SENSITIVE Sensitive     VANCOMYCIN <=0.5 SENSITIVE Sensitive     TRIMETH/SULFA <=10 SENSITIVE Sensitive     CLINDAMYCIN <=0.25 SENSITIVE Sensitive     RIFAMPIN <=0.5 SENSITIVE Sensitive     Inducible Clindamycin NEGATIVE Sensitive     * MODERATE STAPHYLOCOCCUS AUREUS  MRSA PCR Screening     Status: None   Collection Time: 01/01/17 10:16 PM  Result Value Ref Range Status   MRSA by PCR NEGATIVE NEGATIVE Final    Comment:        The GeneXpert MRSA Assay (FDA approved for NASAL specimens only), is one component of a comprehensive MRSA colonization surveillance program. It is not intended to diagnose MRSA infection nor to guide or monitor treatment for MRSA infections.   Culture, blood (Routine X 2) w Reflex to ID Panel     Status: None (Preliminary result)   Collection Time: 01/02/17  8:13 AM  Result Value Ref Range Status   Specimen Description BLOOD LEFT ARM  Final   Special Requests BOTTLES DRAWN AEROBIC AND ANAEROBIC 10CC  Final   Culture   Final    NO GROWTH 2 DAYS Performed at [facility], [place] [address], [place], [place] [address]    Report Status PENDING  Incomplete     Radiology Studies: No results found.  Scheduled Meds: . sodium chloride   Intravenous Once  . celecoxib  200 mg Oral Q12H  . chlorhexidine  60 mL Topical Once  . DAPTOmycin (CUBICIN)  IV  800 mg Intravenous Q24H  . docusate sodium  100 mg Oral BID  . enoxaparin (LOVENOX) injection  55 mg Subcutaneous Q24H  . ferrous sulfate  325 mg Oral TID PC  . multivitamin with minerals  1 tablet Oral Daily  . mupirocin ointment   Nasal BID  .  polyethylene glycol  17 g Oral BID  . rifampin  300 mg Oral Daily  . senna  1 tablet Oral BID  . Vitamin D (Ergocalciferol)  50,000 Units Oral Q Sat   Continuous Infusions: . sodium chloride 100 mL/hr at 01/03/17 1622     LOS: 4 days    Chipper Oman, MD Pager: Text Page via [email or web address]  [phone]  If 7PM-7AM, please contact night-coverage [email or web address] Password [email or web address] 01/04/2017, 1:17 PM

## 2017-01-05 LAB — BASIC METABOLIC PANEL
ANION GAP: 6 (ref 5–15)
BUN: 17 mg/dL (ref 6–20)
CALCIUM: 8.6 mg/dL — AB (ref 8.9–10.3)
CO2: 22 mmol/L (ref 22–32)
Chloride: 110 mmol/L (ref 101–111)
Creatinine, Ser: 0.88 mg/dL (ref 0.61–1.24)
Glucose, Bld: 135 mg/dL — ABNORMAL HIGH (ref 65–99)
POTASSIUM: 3.6 mmol/L (ref 3.5–5.1)
SODIUM: 138 mmol/L (ref 135–145)

## 2017-01-05 LAB — CK: CK TOTAL: 33 U/L — AB (ref 49–397)

## 2017-01-05 MED ORDER — AMLODIPINE BESYLATE 5 MG PO TABS
2.5000 mg | ORAL_TABLET | Freq: Every day | ORAL | Status: DC
  Administered 2017-01-05 – 2017-01-09 (×5): 2.5 mg via ORAL
  Filled 2017-01-05: qty 0.5
  Filled 2017-01-05 (×4): qty 1

## 2017-01-05 NOTE — Progress Notes (Signed)
   Subjective: 4 Days Post-Op Procedure(s) (LRB): IRRIGATION AND DEBRIDEMENT RIGHT TOTAL HIP WITH HEAD AND LINER EXCHANGE AND PLACEMENT OF STIMULIN BEADS (Right)  Pt doing fairly well No change with right hip  Still c/o some tightness and soreness in the right hip Plan for repeat I&D tomorrow Patient reports pain as mild.  Objective:   VITALS:   Vitals:   01/04/17 2252 01/05/17 0655  BP: (!) 151/77 (!) 163/88  Pulse: 70 76  Resp: 17 17  Temp: 98.4 F (36.9 C) 97.9 F (36.6 C)    Right hip incision healing well Mild edema to right lateral hip nv intact distally No rashes or edema distally  LABS  Recent Labs  01/03/17 0329  HGB 10.7*  HCT 30.0*  WBC 15.1*  PLT 250     Recent Labs  01/03/17 0329 01/05/17 0551  NA 135 138  K 3.6 3.6  BUN 16 17  CREATININE 1.06 0.88  GLUCOSE 235* 135*     Assessment/Plan: 4 Days Post-Op Procedure(s) (LRB): IRRIGATION AND DEBRIDEMENT RIGHT TOTAL HIP WITH HEAD AND LINER EXCHANGE AND PLACEMENT OF STIMULIN BEADS (Right) Plan for repeat I&D tomorrow NPO after midnight Pain management as needed    Merla Riches, MPAS, PA-C  01/05/2017, 8:03 AM

## 2017-01-05 NOTE — Progress Notes (Signed)
qPhysical Therapy Treatment Patient Details Name: [name] MRN: [id number] DOB: October 18, 1960 Today's Date: 01/05/2017    History of Present Illness 56 year old man s/p R hip I & D and liner exchange    PT Comments    Pt limited by pain and fatigue this session, RN notified; pt to go to OR tomorrow for repeat I and D  Follow Up Recommendations  Home health PT;Supervision/Assistance - 24 hour     Equipment Recommendations  None recommended by PT    Recommendations for Other Services       Precautions / Restrictions Precautions Precautions: Fall;Posterior Hip Precaution Comments: pt recalls 0/3 THP;  Restrictions Weight Bearing Restrictions: No RUE Weight Bearing: Weight bearing as tolerated    Mobility  Bed Mobility Overal bed mobility: Needs Assistance Bed Mobility: Supine to Sit     Supine to sit: Min assist     General bed mobility comments: assist for RLE out of bed;  cues for sequence, THPs  Transfers Overall transfer level: Needs assistance Equipment used: Rolling walker (2 wheeled) Transfers: Sit to/from Stand Sit to Stand: Min assist         General transfer comment: [place], hand placement, and to control descent  Ambulation/Gait Ambulation/Gait assistance: Min guard;Min assist Ambulation Distance (Feet): 9 Feet Assistive device: Rolling walker (2 wheeled) Gait Pattern/deviations: Step-to pattern;Decreased step length - right;Decreased stance time - right;Antalgic     General Gait Details: lots of pain with amb, pt too painful and fatigued to amb any further than distance above, chair to pt   Stairs            Wheelchair Mobility    Modified Rankin (Stroke Patients Only)       Balance                                            Cognition Arousal/Alertness: Awake/alert Behavior During Therapy: WFL for tasks assessed/performed Overall Cognitive Status: Within Functional Limits for tasks  assessed                                        Exercises Total Joint Exercises Ankle Circles/Pumps: AROM;Both;10 reps    General Comments        Pertinent Vitals/Pain Pain Assessment: 0-10 Pain Score: 8  Pain Location: R hip  Pain Descriptors / Indicators: Constant Pain Intervention(s): Limited activity within patient's tolerance;Monitored during session;Patient requesting pain meds-RN notified    Home Living                      Prior Function            PT Goals (current goals can now be found in the care plan section) Acute Rehab PT Goals Patient Stated Goal: decreased pain and return to independence PT Goal Formulation: With patient Time For Goal Achievement: 01/16/17 Potential to Achieve Goals: Good Progress towards PT goals: Progressing toward goals (slowly, limited by pain this session)    Frequency    7X/week      PT Plan Current plan remains appropriate    Co-evaluation             End of Session Equipment Utilized During Treatment: Gait belt Activity Tolerance: Patient tolerated treatment well Patient left:  in chair;with call bell/phone within reach;with chair alarm set Nurse Communication: Patient requests pain meds PT Visit Diagnosis: Unsteadiness on feet (R26.81) Pain - Right/Left: Right Pain - part of body: Hip     Time: 1222-1232 PT Time Calculation (min) (ACUTE ONLY): 10 min  Charges:  $Gait Training: 8-22 mins                    G CodesKenyon Ana, PT Pager: [phone] 01/05/2017    [facility] 01/05/2017, 12:43 PM

## 2017-01-05 NOTE — Progress Notes (Addendum)
PROGRESS NOTE [facility]   Colbert Curenton   JJO:[id number] DOB: March 27, 1961  DOA: 12/31/2016 PCP: No PCP Per Patient   Brief Narrative:  [name] is an 56 y.o. male with PMHx of HTN, multiple joint replacement, presented for I&D and polyexchange due to right hip infection. Found to have AKI and hyponatremia. Was started on IVF with good response. Patient went for I&D on 01/01/17, and was found to have MSSA bacteremia. ID was consulted and started on dapto. Now patient with R lower extremity edema.   Subjective: Patient seen and examined, leg swelling continues to be the same. Leg tenderness improving. Doppler US of LE was negative for DVT. No other complaints at this time. Per primary team for I&D on Monday.   Assessment & Plan: Impression/Recommendations Acute kidney injury - prerenal from sepsis and hypotension and continuation of ARBs - Resolved Cr 0.88 -Baseline creatinine appears to be 0.7, creatinine peaked to 1.82 -Patient was noted to be on Diovan, continue to hold  -Renal ultrasound normal  -Monitor BMP as needed will defer to primary team   Hyponatremia in setting of AKI, some chronic component as well - resolved with IVF  -Patient has had mild hyponatremia since 2011, as low as 127. His baseline appears to be approximately 129-133 -See above    MSSA Bacteremia -Blood cultures from 12/31/16: MSSA -ID recommending 6 week of abx treatment and obtain TEE, TEE has been scheduled for Monday per cardiology team, although I&D is also planned for Monday. TEE can be reschedule if needed.  -Continue Dapto per ID  -Patient will need a PICC line this can be arranged prior to discharge, with IV team  -Repeat blood cultures 01/02/17 NGTD  Right Hip infection; hardware, now with R LE edema, no pitting  -Per primary, orthopedics, s/p IRRIGATION AND DEBRIDEMENT RIGHT TOTAL HIP WITH HEAD AND LINER EXCHANGE AND PLACEMENT OF STIMULIN BEADS  -Was on Cipro, Flagyl, vancomycin per  pharmacy- transitioned to daptomycin -Doppler LE Negative for DVT at the common femoral vein   Essential hypertension - initially hypotensive, now BP above goal  -Home medications were on hold - Coreg, Norvasc and Diovan -Will resume Norvasc for now, will continue to hold Coreg and Diovan -If BP continues to be above resume Coreg first, then Diovan  -Monitor BP goal < 140/90  Hypotension - resolved -Patient was hypotensive after surgery on 3/21. Was given 2uPRBCs as well as fluid bolus -Continue to monitor  Constipation -Continue MiraLAX and senna  From the medical team standpoint patient is stable, and no further recommendation are indicated at this point. Medicine team will sign off. Call back if you need our assistance again. Thank you for allow [place] to participate in the care of your patient.   DVT prophylaxis: Lovenox  Code Status: FULL  Family Communication: None at bedside  Disposition Plan: Per primary team   Objective: Vitals:   01/04/17 1455 01/04/17 2252 01/05/17 0500 01/05/17 0655  BP: (!) 142/61 (!) 151/77  (!) 163/88  Pulse: 78 70  76  Resp: 17 17  17   Temp: 97.7 F (36.5 C) 98.4 F (36.9 C)  97.9 F (36.6 C)  TempSrc: Oral Oral  Oral  SpO2: 99% 98%  99%  Weight:   113.4 kg (250 lb)   Height:        Intake/Output Summary (Last 24 hours) at 01/05/17 1248 Last data filed at 01/05/17 1009  Gross per 24 hour  Intake  1440 ml  Output              900 ml  Net              540 ml   Filed Weights   01/03/17 0500 01/04/17 0500 01/05/17 0500  Weight: 112.2 kg (247 lb 5.7 oz) 113.4 kg (250 lb) 113.4 kg (250 lb)    Examination:  General exam: No distress  Respiratory system: CTA  Cardiovascular system: S1S2 RRR Extremities: R LE edematous extending from the hip to the ankle, tenderness only at the knee. Mild erythema at the right thigh - no changes  Skin: Dressing intact Psychiatry: Mood appropriate   Data Reviewed: I have personally  reviewed following labs and imaging studies  CBC:  Recent Labs Lab 12/31/16 1136 12/31/16 1505 01/01/17 0511 01/01/17 1951 01/02/17 0334 01/03/17 0329  WBC 10.6*  --  10.6*  --  13.9* 15.1*  HGB 11.2* 11.2* 10.2* 8.8* 10.6* 10.7*  HCT 30.8* 33.0* 28.2* 26.0* 30.5* 30.0*  MCV 92.2  --  93.4  --  91.0 91.2  PLT 211  --  199  --  218 742   Basic Metabolic Panel:  Recent Labs Lab 12/31/16 1510 01/01/17 0511 01/01/17 1951 01/02/17 0334 01/03/17 0329 01/05/17 0551  NA 122* 127* 133* 132* 135 138  K 4.0 3.9 3.8 3.9 3.6 3.6  CL 91* 98*  --  105 108 110  CO2 20* 19*  --  15* 19* 22  GLUCOSE 111* 110* 104* 115* 235* 135*  BUN 31* 31*  --  21* 16 17  CREATININE 1.62* 1.58*  --  1.16 1.06 0.88  CALCIUM 9.2 8.5*  --  8.4* 8.9 8.6*   GFR: Estimated Creatinine Clearance: 111.3 mL/min (by C-G formula based on SCr of 0.88 mg/dL). Liver Function Tests:  Recent Labs Lab 12/31/16 1136  AST 44*  ALT 28  ALKPHOS 56  BILITOT 1.0  PROT 7.1  ALBUMIN 3.3*   Cardiac Enzymes:  Recent Labs Lab 01/01/17 1551 01/02/17 0334 01/05/17 0551  CKTOTAL 479* 391 33*    Recent Results (from the past 240 hour(s))  Culture, blood (routine x 2)     Status: Abnormal   Collection Time: 12/31/16  5:35 PM  Result Value Ref Range Status   Specimen Description BLOOD BLOOD RIGHT ARM  Final   Special Requests BOTTLES DRAWN AEROBIC AND ANAEROBIC 7 CC EA  Final   Culture  Setup Time   Final    GRAM POSITIVE COCCI IN CLUSTERS IN BOTH AEROBIC AND ANAEROBIC BOTTLES CRITICAL RESULT CALLED TO, READ BACK BY AND VERIFIED WITH: EBurman Foster.D. 12:25 01/01/17 ([email or web address]) Performed at [facility], [place] [address] [place], [place] [address]    Culture STAPHYLOCOCCUS AUREUS (A)  Final   Report Status 01/03/2017 FINAL  Final   Organism ID, Bacteria STAPHYLOCOCCUS AUREUS  Final      Susceptibility   Staphylococcus aureus - MIC*    CIPROFLOXACIN <=0.5 SENSITIVE Sensitive     ERYTHROMYCIN <=0.25  SENSITIVE Sensitive     GENTAMICIN <=0.5 SENSITIVE Sensitive     OXACILLIN 0.5 SENSITIVE Sensitive     TETRACYCLINE <=1 SENSITIVE Sensitive     VANCOMYCIN 1 SENSITIVE Sensitive     TRIMETH/SULFA <=10 SENSITIVE Sensitive     CLINDAMYCIN <=0.25 SENSITIVE Sensitive     RIFAMPIN <=0.5 SENSITIVE Sensitive     Inducible Clindamycin NEGATIVE Sensitive     * STAPHYLOCOCCUS AUREUS  Blood Culture ID Panel (Reflexed)  Status: Abnormal   Collection Time: 12/31/16  5:35 PM  Result Value Ref Range Status   Enterococcus species NOT DETECTED NOT DETECTED Final   Listeria monocytogenes NOT DETECTED NOT DETECTED Final   Staphylococcus species DETECTED (A) NOT DETECTED Final    Comment: CRITICAL RESULT CALLED TO, READ BACK BY AND VERIFIED WITH: EBurman Foster.D. 12:25 01/01/17 ([email or web address])    Staphylococcus aureus DETECTED (A) NOT DETECTED Final    Comment: Methicillin (oxacillin) susceptible Staphylococcus aureus (MSSA). Preferred therapy is anti staphylococcal beta lactam antibiotic (Cefazolin or Nafcillin), unless clinically contraindicated. CRITICAL RESULT CALLED TO, READ BACK BY AND VERIFIED WITH: EBurman Foster.D. 12:25 01/01/17 ([email or web address])    Methicillin resistance NOT DETECTED NOT DETECTED Final   Streptococcus species NOT DETECTED NOT DETECTED Final   Streptococcus agalactiae NOT DETECTED NOT DETECTED Final   Streptococcus pneumoniae NOT DETECTED NOT DETECTED Final   Streptococcus pyogenes NOT DETECTED NOT DETECTED Final   Acinetobacter baumannii NOT DETECTED NOT DETECTED Final   Enterobacteriaceae species NOT DETECTED NOT DETECTED Final   Enterobacter cloacae complex NOT DETECTED NOT DETECTED Final   Escherichia coli NOT DETECTED NOT DETECTED Final   Klebsiella oxytoca NOT DETECTED NOT DETECTED Final   Klebsiella pneumoniae NOT DETECTED NOT DETECTED Final   Proteus species NOT DETECTED NOT DETECTED Final   Serratia marcescens NOT DETECTED NOT DETECTED Final   Haemophilus influenzae NOT  DETECTED NOT DETECTED Final   Neisseria meningitidis NOT DETECTED NOT DETECTED Final   Pseudomonas aeruginosa NOT DETECTED NOT DETECTED Final   Candida albicans NOT DETECTED NOT DETECTED Final   Candida glabrata NOT DETECTED NOT DETECTED Final   Candida krusei NOT DETECTED NOT DETECTED Final   Candida parapsilosis NOT DETECTED NOT DETECTED Final   Candida tropicalis NOT DETECTED NOT DETECTED Final    Comment: Performed at [facility], [address] [place], [place] [address]  Culture, blood (routine x 2)     Status: Abnormal   Collection Time: 12/31/16  5:45 PM  Result Value Ref Range Status   Specimen Description BLOOD BLOOD RIGHT ARM  Final   Special Requests BOTTLES DRAWN AEROBIC AND ANAEROBIC 5.5 CC EA  Final   Culture  Setup Time   Final    GRAM POSITIVE COCCI IN BOTH AEROBIC AND ANAEROBIC BOTTLES CRITICAL VALUE NOTED.  VALUE IS CONSISTENT WITH PREVIOUSLY REPORTED AND CALLED VALUE.    Culture (A)  Final    STAPHYLOCOCCUS AUREUS SUSCEPTIBILITIES PERFORMED ON PREVIOUS CULTURE WITHIN THE LAST 5 DAYS. Performed at [facility], [place] [address] [place], [place] [address]    Report Status 01/03/2017 FINAL  Final  Surgical PCR screen     Status: Abnormal   Collection Time: 01/01/17  2:11 PM  Result Value Ref Range Status   MRSA, PCR NEGATIVE NEGATIVE Final   Staphylococcus aureus POSITIVE (A) NEGATIVE Final    Comment:        The Xpert SA Assay (FDA approved for NASAL specimens in patients over 88 years of age), is one component of a comprehensive surveillance program.  Test performance has been validated by [facility] for patients greater than or equal to 39 year old. It is not intended to diagnose infection nor to guide or monitor treatment.   Aerobic/Anaerobic Culture (surgical/deep wound)     Status: None (Preliminary result)   Collection Time: 01/01/17  6:51 PM  Result Value Ref Range Status   Specimen Description SYNOVIAL RIGHT HIP  Final   Special  Requests FLUID ON  SWAB  Final   Gram Stain   Final    ABUNDANT WBC PRESENT,BOTH PMN AND MONONUCLEAR FEW GRAM POSITIVE COCCI IN PAIRS Performed at [facility], [place] [address], [place], [place] [address]    Culture   Final    MODERATE STAPHYLOCOCCUS AUREUS NO ANAEROBES ISOLATED; CULTURE IN PROGRESS FOR 5 DAYS    Report Status PENDING  Incomplete   Organism ID, Bacteria STAPHYLOCOCCUS AUREUS  Final      Susceptibility   Staphylococcus aureus - MIC*    CIPROFLOXACIN <=0.5 SENSITIVE Sensitive     ERYTHROMYCIN <=0.25 SENSITIVE Sensitive     GENTAMICIN <=0.5 SENSITIVE Sensitive     OXACILLIN 0.5 SENSITIVE Sensitive     TETRACYCLINE <=1 SENSITIVE Sensitive     VANCOMYCIN <=0.5 SENSITIVE Sensitive     TRIMETH/SULFA <=10 SENSITIVE Sensitive     CLINDAMYCIN <=0.25 SENSITIVE Sensitive     RIFAMPIN <=0.5 SENSITIVE Sensitive     Inducible Clindamycin NEGATIVE Sensitive     * MODERATE STAPHYLOCOCCUS AUREUS  MRSA PCR Screening     Status: None   Collection Time: 01/01/17 10:16 PM  Result Value Ref Range Status   MRSA by PCR NEGATIVE NEGATIVE Final    Comment:        The GeneXpert MRSA Assay (FDA approved for NASAL specimens only), is one component of a comprehensive MRSA colonization surveillance program. It is not intended to diagnose MRSA infection nor to guide or monitor treatment for MRSA infections.   Culture, blood (Routine X 2) w Reflex to ID Panel     Status: None (Preliminary result)   Collection Time: 01/02/17  8:13 AM  Result Value Ref Range Status   Specimen Description BLOOD LEFT ARM  Final   Special Requests BOTTLES DRAWN AEROBIC AND ANAEROBIC 10CC  Final   Culture   Final    NO GROWTH 2 DAYS Performed at [facility], [place] [address], [place], [place] [address]    Report Status PENDING  Incomplete     Radiology Studies: No results found.  Scheduled Meds: . sodium chloride   Intravenous Once  . celecoxib  200 mg Oral Q12H  . chlorhexidine  60 mL  Topical Once  . DAPTOmycin (CUBICIN)  IV  800 mg Intravenous Q24H  . docusate sodium  100 mg Oral BID  . enoxaparin (LOVENOX) injection  55 mg Subcutaneous Q24H  . ferrous sulfate  325 mg Oral TID PC  . multivitamin with minerals  1 tablet Oral Daily  . mupirocin ointment   Nasal BID  . polyethylene glycol  17 g Oral BID  . rifampin  300 mg Oral Daily  . senna  1 tablet Oral BID  . Vitamin D (Ergocalciferol)  50,000 Units Oral Q Sat   Continuous Infusions:    LOS: 5 days    Chipper Oman, MD Pager: Text Page via [email or web address]  [phone]  If 7PM-7AM, please contact night-coverage [email or web address] Password [facility] 01/05/2017, 12:48 PM

## 2017-01-06 ENCOUNTER — Other Ambulatory Visit (HOSPITAL_COMMUNITY): Payer: Self-pay

## 2017-01-06 ENCOUNTER — Ambulatory Visit (HOSPITAL_COMMUNITY): Admission: RE | Admit: 2017-01-06 | Payer: Self-pay | Source: Ambulatory Visit | Admitting: Cardiology

## 2017-01-06 LAB — HEPATIC FUNCTION PANEL
ALBUMIN: 2.3 g/dL — AB (ref 3.5–5.0)
ALK PHOS: 113 U/L (ref 38–126)
ALT: <5 U/L — ABNORMAL LOW (ref 17–63)
AST: 24 U/L (ref 15–41)
BILIRUBIN INDIRECT: 0.5 mg/dL (ref 0.3–0.9)
BILIRUBIN TOTAL: 0.8 mg/dL (ref 0.3–1.2)
Bilirubin, Direct: 0.3 mg/dL (ref 0.1–0.5)
TOTAL PROTEIN: 5.8 g/dL — AB (ref 6.5–8.1)

## 2017-01-06 LAB — AEROBIC/ANAEROBIC CULTURE W GRAM STAIN (SURGICAL/DEEP WOUND)

## 2017-01-06 LAB — AEROBIC/ANAEROBIC CULTURE (SURGICAL/DEEP WOUND)

## 2017-01-06 MED ORDER — POVIDONE-IODINE 10 % EX SWAB
2.0000 "application " | Freq: Once | CUTANEOUS | Status: AC
  Administered 2017-01-07: 2 via TOPICAL

## 2017-01-06 MED ORDER — SODIUM CHLORIDE 0.9 % IV SOLN
1000.0000 mg | INTRAVENOUS | Status: AC
  Administered 2017-01-07: 1000 mg via INTRAVENOUS
  Filled 2017-01-06: qty 1100
  Filled 2017-01-06: qty 10

## 2017-01-06 MED ORDER — CHLORHEXIDINE GLUCONATE 4 % EX LIQD
60.0000 mL | Freq: Once | CUTANEOUS | Status: AC
  Administered 2017-01-07: 4 via TOPICAL

## 2017-01-06 MED ORDER — PREMIER PROTEIN SHAKE
11.0000 [oz_av] | Freq: Two times a day (BID) | ORAL | Status: DC
  Administered 2017-01-06 – 2017-01-09 (×3): 11 [oz_av] via ORAL
  Filled 2017-01-06 (×8): qty 325.31

## 2017-01-06 NOTE — Progress Notes (Signed)
Patient ID: [name], male   DOB: June 25, 1961, 56 y.o.   MRN: [id number] Subjective: 5 Days Post-Op Procedure(s) (LRB): IRRIGATION AND DEBRIDEMENT RIGHT TOTAL HIP WITH HEAD AND LINER EXCHANGE AND PLACEMENT OF STIMULIN BEADS (Right)    Patient reports pain as mild to moderate, anxious about his hip  Objective:   VITALS:   Vitals:   01/06/17 0518 01/06/17 0957  BP: (!) 177/66 (!) 167/63  Pulse: 73 63  Resp: 16 16  Temp: 97.9 F (36.6 C) 98.5 F (36.9 C)    Neurovascular intact Incision: no drainage but surrounding swelling and erythema  LABS No results for input(s): HGB, HCT, WBC, PLT in the last 72 hours.   Recent Labs  01/05/17 0551  NA 138  K 3.6  BUN 17  CREATININE 0.88  GLUCOSE 135*    No results for input(s): LABPT, INR in the last 72 hours.   Assessment/Plan: 5 Days Post-Op Procedure(s) (LRB): IRRIGATION AND DEBRIDEMENT RIGHT TOTAL HIP WITH HEAD AND LINER EXCHANGE AND PLACEMENT OF STIMULIN BEADS (Right)  Plan: Based on the extent of the infection identified at the index I&D I feel that a repeat procedure would benefit our goal of single stage cure  Due to planned TEE today I have changed schedule to have this procedure addressed tomorrow, 3/27 Regular diet after TEE NPO after midnight Urgent orders/consent placed

## 2017-01-06 NOTE — Progress Notes (Signed)
Per Dr. Marlowe Sax progress note on 01/05/17 "TEE has been scheduled for Monday per cardiology team, although I&D is also planned for Monday. TEE can be reschedule if needed". Nurse was told [facility] would be coming to transport patient to [facility] for TEE at 0800 today. Nurse called [facility]. Patient is not scheduled to be transported this morning. Nurse did not schedule transport due to patient needing I&D today. Nurse will make on-coming nurse aware of TEE procedure needing to be rescheduled.

## 2017-01-06 NOTE — Progress Notes (Signed)
Received a call from Endo at [facility] this morning about the TEE supposedly planned for pt this morning. I find no note about what time this was planned for and transport was not scheduled for this patient, so patient was not able to get to [facility] at the time his procedure was scheduled. Nurse from [facility] said she would call back if they got him moved to a 1200 appt today. Because no return call occurred, RN called [facility] and spoke to [facility] who said patient has not been rescheduled at this time and she knows there is no room to have his procedure done today. When this procedure is rescheduled, endo nurse should make a note for the time, and notify current nurse for patient so [facility] transport can be scheduled. Patient updated and regular diet will be resumed at this time. I&D scheduled with Dr. Alvan Dame for 3/27.

## 2017-01-06 NOTE — Progress Notes (Signed)
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 Pt has RW and 3in1 at home. This CM faxed over HHPT order, demographics, op note and H&P (fax: [phone]). CM will continue to follow. Marney Doctor RN,BSN,NCM [phone]

## 2017-01-06 NOTE — Progress Notes (Signed)
Initial Nutrition Assessment  DOCUMENTATION CODES:   Obesity unspecified  INTERVENTION:   Premier Protein BID, each supplement provides 160kcal and 30g protein.   MVI  NUTRITION DIAGNOSIS:   Increased nutrient needs related to wound healing, other (see comment) (hip infection) as evidenced by increased estimated needs from protein.  GOAL:   Patient will meet greater than or equal to 90% of their needs  MONITOR:   PO intake, Supplement acceptance, Skin, Weight trends  REASON FOR ASSESSMENT:   NPO/Clear Liquid Diet    ASSESSMENT:   55 y.o. male with PMHx of HTN, multiple joint replacement, presented for I&D and polyexchange due to right hip infection. Found to have AKI and hyponatremia. Was started on IVF with good response. Patient went for I&D on 01/01/17, and was found to have MSSA bacteremia. ID was consulted and started on dapto. Now patient with R lower extremity edema.   Met with pt in room today. Pt reports poor appetite for the past 5 days r/t hip pain and infection. Pt currently eating 50-100% meals; however, pt has been intermittently NPO multiple days for procedures. Pt NPO today for scheduled TEE and pt will be NPO after midnight tonight for scheduled I & D tomorrow. Pt possibly to have multiple debridements and likely to be NPO quite a bit. RD discussed with pt the importance of adequate protein intake for wound healing. RD will order Premier Protein. RD explained to pt that he needs to try and drink two protein shakes during the times when he is not NPO; pt agrees and is willing to try this.   Medications reviewed and include: daptomycin, colace, ferrous sulfate, MVI, senokot, Vit D, hydrocodone   Labs reviewed: Ca 8.6(L) Wbc- 15.1(H)  Nutrition-Focused physical exam completed. Findings are no fat depletion, no muscle depletion, and moderate edema in right hip and leg.   Diet Order:  Diet regular Room service appropriate? Yes; Fluid consistency: Thin Diet NPO  time specified Except for: Sips with Meds  Skin:  Wound (see comment) (incision hip)  Last BM:  3/25  Height:   Ht Readings from Last 1 Encounters:  12/31/16 5' 7" (1.702 m)    Weight:   Wt Readings from Last 1 Encounters:  01/05/17 250 lb (113.4 kg)    Ideal Body Weight:  67.2 kg  BMI:  Body mass index is 39.16 kg/m.  Estimated Nutritional Needs:   Kcal:  2100-2400kcal/day   Protein:  114-136g/day   Fluid:  >2L/day   EDUCATION NEEDS:   No education needs identified at this time  Casey Campbell, RD, LDN Pager #- [phone]  

## 2017-01-06 NOTE — Progress Notes (Signed)
PT Cancellation Note  Patient Details Name: [name] MRN: [id number] DOB: 06-27-1961   Cancelled Treatment:    Reason Eval/Treat Not Completed: Other (comment) (planned I and d today.  continue   after surgery and reordwered for PT)   Claretha Cooper 01/06/2017, 7:18 AM Tresa Endo PT [phone]

## 2017-01-06 NOTE — Progress Notes (Addendum)
INFECTIOUS DISEASE PROGRESS NOTE  ID: [name] is a 56 y.o. male with  Active Problems:   Essential hypertension   Acute hyponatremia   Acute renal disease   Prosthetic hip infection (HCC)   S/P hip replacement  Subjective: Without complaints.   Abtx:  Anti-infectives    Start     Dose/Rate Route Frequency Ordered Stop   01/03/17 1000  rifampin (RIFADIN) capsule 300 mg     300 mg Oral Daily 01/03/17 0833     01/01/17 2100  vancomycin (VANCOCIN) 1,250 mg in sodium chloride 0.9 % 250 mL IVPB  Status:  Discontinued     1,250 mg 166.7 mL/hr over 90 Minutes Intravenous Every 24 hours 12/31/16 2020 01/01/17 1542   01/01/17 1700  DAPTOmycin (CUBICIN) 800 mg in sodium chloride 0.9 % IVPB     800 mg 232 mL/hr over 30 Minutes Intravenous Every 24 hours 01/01/17 1605     01/01/17 0600  ciprofloxacin (CIPRO) IVPB 400 mg  Status:  Discontinued     400 mg 200 mL/hr over 60 Minutes Intravenous Every 12 hours 12/31/16 1806 01/01/17 1542   12/31/16 1700  metroNIDAZOLE (FLAGYL) IVPB 500 mg  Status:  Discontinued     500 mg 100 mL/hr over 60 Minutes Intravenous Every 8 hours 12/31/16 1551 01/01/17 1542   12/31/16 1600  ciprofloxacin (CIPRO) IVPB 400 mg     400 mg 200 mL/hr over 60 Minutes Intravenous STAT 12/31/16 1558 12/31/16 1901   12/31/16 1600  vancomycin (VANCOCIN) 2,000 mg in sodium chloride 0.9 % 500 mL IVPB     2,000 mg 250 mL/hr over 120 Minutes Intravenous STAT 12/31/16 1559 01/01/17 0004   12/31/16 1415  vancomycin (VANCOCIN) 1,000 mg in sodium chloride 0.9 % 250 mL IVPB  Status:  Discontinued     1,000 mg 250 mL/hr over 60 Minutes Intravenous  Once 12/31/16 1407 12/31/16 1408   12/31/16 1408  vancomycin (VANCOCIN) IVPB 1000 mg/200 mL premix  Status:  Discontinued     1,000 mg 200 mL/hr over 60 Minutes Intravenous 60 min pre-op 12/31/16 1408 12/31/16 1536   12/31/16 1408  vancomycin (VANCOCIN) 1-5 GM/200ML-% IVPB  Status:  Discontinued    Comments:  Harvell, Gwendolyn  :  cabinet override      12/31/16 1408 12/31/16 1558      Medications:  Scheduled: . sodium chloride   Intravenous Once  . amLODipine  2.5 mg Oral Daily  . celecoxib  200 mg Oral Q12H  . chlorhexidine  60 mL Topical Once  . DAPTOmycin (CUBICIN)  IV  800 mg Intravenous Q24H  . docusate sodium  100 mg Oral BID  . ferrous sulfate  325 mg Oral TID PC  . multivitamin with minerals  1 tablet Oral Daily  . mupirocin ointment   Nasal BID  . polyethylene glycol  17 g Oral BID  . protein supplement shake  11 oz Oral BID BM  . rifampin  300 mg Oral Daily  . senna  1 tablet Oral BID  . Vitamin D (Ergocalciferol)  50,000 Units Oral Q Sat    Objective: Vital signs in last 24 hours: Temp:  [97.9 F (36.6 C)-98.5 F (36.9 C)] 97.9 F (36.6 C) (03/26 1359) Pulse Rate:  [63-94] 94 (03/26 1359) Resp:  [16] 16 (03/26 1359) BP: (124-177)/(48-66) 124/48 (03/26 1359) SpO2:  [99 %-100 %] 100 % (03/26 1359)   General appearance: alert, cooperative and no distress Resp: clear to auscultation bilaterally Cardio: regular rate and  rhythm GI: normal findings: bowel sounds normal and soft, non-tender Extremities: R hip- tender, mild-mod tenderness. mild decrease in erythema.   Lab Results  Recent Labs  01/05/17 0551  NA 138  K 3.6  CL 110  CO2 22  BUN 17  CREATININE 0.88   Liver Panel No results for input(s): PROT, ALBUMIN, AST, ALT, ALKPHOS, BILITOT, BILIDIR, IBILI in the last 72 hours. Sedimentation Rate No results for input(s): ESRSEDRATE in the last 72 hours. C-Reactive Protein No results for input(s): CRP in the last 72 hours.  Microbiology: Recent Results (from the past 240 hour(s))  Culture, blood (routine x 2)     Status: Abnormal   Collection Time: 12/31/16  5:35 PM  Result Value Ref Range Status   Specimen Description BLOOD BLOOD RIGHT ARM  Final   Special Requests BOTTLES DRAWN AEROBIC AND ANAEROBIC 7 CC EA  Final   Culture  Setup Time   Final    GRAM POSITIVE COCCI IN  CLUSTERS IN BOTH AEROBIC AND ANAEROBIC BOTTLES CRITICAL RESULT CALLED TO, READ BACK BY AND VERIFIED WITH: EBurman Foster.D. 12:25 01/01/17 ([email or web address]) Performed at [facility], [place] [address], [place], [place] [address]    Culture STAPHYLOCOCCUS AUREUS (A)  Final   Report Status 01/03/2017 FINAL  Final   Organism ID, Bacteria STAPHYLOCOCCUS AUREUS  Final      Susceptibility   Staphylococcus aureus - MIC*    CIPROFLOXACIN <=0.5 SENSITIVE Sensitive     ERYTHROMYCIN <=0.25 SENSITIVE Sensitive     GENTAMICIN <=0.5 SENSITIVE Sensitive     OXACILLIN 0.5 SENSITIVE Sensitive     TETRACYCLINE <=1 SENSITIVE Sensitive     VANCOMYCIN 1 SENSITIVE Sensitive     TRIMETH/SULFA <=10 SENSITIVE Sensitive     CLINDAMYCIN <=0.25 SENSITIVE Sensitive     RIFAMPIN <=0.5 SENSITIVE Sensitive     Inducible Clindamycin NEGATIVE Sensitive     * STAPHYLOCOCCUS AUREUS  Blood Culture ID Panel (Reflexed)     Status: Abnormal   Collection Time: 12/31/16  5:35 PM  Result Value Ref Range Status   Enterococcus species NOT DETECTED NOT DETECTED Final   Listeria monocytogenes NOT DETECTED NOT DETECTED Final   Staphylococcus species DETECTED (A) NOT DETECTED Final    Comment: CRITICAL RESULT CALLED TO, READ BACK BY AND VERIFIED WITH: EBurman Foster.D. 12:25 01/01/17 ([email or web address])    Staphylococcus aureus DETECTED (A) NOT DETECTED Final    Comment: Methicillin (oxacillin) susceptible Staphylococcus aureus (MSSA). Preferred therapy is anti staphylococcal beta lactam antibiotic (Cefazolin or Nafcillin), unless clinically contraindicated. CRITICAL RESULT CALLED TO, READ BACK BY AND VERIFIED WITH: EBurman Foster.D. 12:25 01/01/17 ([email or web address])    Methicillin resistance NOT DETECTED NOT DETECTED Final   Streptococcus species NOT DETECTED NOT DETECTED Final   Streptococcus agalactiae NOT DETECTED NOT DETECTED Final   Streptococcus pneumoniae NOT DETECTED NOT DETECTED Final   Streptococcus pyogenes NOT DETECTED NOT DETECTED  Final   Acinetobacter baumannii NOT DETECTED NOT DETECTED Final   Enterobacteriaceae species NOT DETECTED NOT DETECTED Final   Enterobacter cloacae complex NOT DETECTED NOT DETECTED Final   Escherichia coli NOT DETECTED NOT DETECTED Final   Klebsiella oxytoca NOT DETECTED NOT DETECTED Final   Klebsiella pneumoniae NOT DETECTED NOT DETECTED Final   Proteus species NOT DETECTED NOT DETECTED Final   Serratia marcescens NOT DETECTED NOT DETECTED Final   Haemophilus influenzae NOT DETECTED NOT DETECTED Final   Neisseria meningitidis NOT DETECTED NOT DETECTED Final   Pseudomonas aeruginosa NOT DETECTED NOT DETECTED Final  Candida albicans NOT DETECTED NOT DETECTED Final   Candida glabrata NOT DETECTED NOT DETECTED Final   Candida krusei NOT DETECTED NOT DETECTED Final   Candida parapsilosis NOT DETECTED NOT DETECTED Final   Candida tropicalis NOT DETECTED NOT DETECTED Final    Comment: Performed at [facility], [place] [address] [place], [place] [address]  Culture, blood (routine x 2)     Status: Abnormal   Collection Time: 12/31/16  5:45 PM  Result Value Ref Range Status   Specimen Description BLOOD BLOOD RIGHT ARM  Final   Special Requests BOTTLES DRAWN AEROBIC AND ANAEROBIC 5.5 CC EA  Final   Culture  Setup Time   Final    GRAM POSITIVE COCCI IN BOTH AEROBIC AND ANAEROBIC BOTTLES CRITICAL VALUE NOTED.  VALUE IS CONSISTENT WITH PREVIOUSLY REPORTED AND CALLED VALUE.    Culture (A)  Final    STAPHYLOCOCCUS AUREUS SUSCEPTIBILITIES PERFORMED ON PREVIOUS CULTURE WITHIN THE LAST 5 DAYS. Performed at [facility], [place] [address] [place], [place] [address]    Report Status 01/03/2017 FINAL  Final  Surgical PCR screen     Status: Abnormal   Collection Time: 01/01/17  2:11 PM  Result Value Ref Range Status   MRSA, PCR NEGATIVE NEGATIVE Final   Staphylococcus aureus POSITIVE (A) NEGATIVE Final    Comment:        The Xpert SA Assay (FDA approved for NASAL specimens in patients  over 75 years of age), is one component of a comprehensive surveillance program.  Test performance has been validated by [facility] for patients greater than or equal to 38 year old. It is not intended to diagnose infection nor to guide or monitor treatment.   Aerobic/Anaerobic Culture (surgical/deep wound)     Status: None   Collection Time: 01/01/17  6:51 PM  Result Value Ref Range Status   Specimen Description SYNOVIAL RIGHT HIP  Final   Special Requests FLUID ON SWAB  Final   Gram Stain   Final    ABUNDANT WBC PRESENT,BOTH PMN AND MONONUCLEAR FEW GRAM POSITIVE COCCI IN PAIRS    Culture   Final    MODERATE STAPHYLOCOCCUS AUREUS NO ANAEROBES ISOLATED Performed at [facility], [place] [address], [place],  [address]    Report Status 01/06/2017 FINAL  Final   Organism ID, Bacteria STAPHYLOCOCCUS AUREUS  Final      Susceptibility   Staphylococcus aureus - MIC*    CIPROFLOXACIN <=0.5 SENSITIVE Sensitive     ERYTHROMYCIN <=0.25 SENSITIVE Sensitive     GENTAMICIN <=0.5 SENSITIVE Sensitive     OXACILLIN 0.5 SENSITIVE Sensitive     TETRACYCLINE <=1 SENSITIVE Sensitive     VANCOMYCIN <=0.5 SENSITIVE Sensitive     TRIMETH/SULFA <=10 SENSITIVE Sensitive     CLINDAMYCIN <=0.25 SENSITIVE Sensitive     RIFAMPIN <=0.5 SENSITIVE Sensitive     Inducible Clindamycin NEGATIVE Sensitive     * MODERATE STAPHYLOCOCCUS AUREUS  MRSA PCR Screening     Status: None   Collection Time: 01/01/17 10:16 PM  Result Value Ref Range Status   MRSA by PCR NEGATIVE NEGATIVE Final    Comment:        The GeneXpert MRSA Assay (FDA approved for NASAL specimens only), is one component of a comprehensive MRSA colonization surveillance program. It is not intended to diagnose MRSA infection nor to guide or monitor treatment for MRSA infections.   Culture, blood (Routine X 2) w Reflex to ID Panel  Status: None (Preliminary result)   Collection Time: 01/02/17  8:13 AM  Result Value Ref Range  Status   Specimen Description BLOOD LEFT ARM  Final   Special Requests BOTTLES DRAWN AEROBIC AND ANAEROBIC 10CC  Final   Culture   Final    NO GROWTH 4 DAYS Performed at [facility], [place] [address] [place], [place] [address]    Report Status PENDING  Incomplete    Studies/Results: No results found.   Assessment/Plan: MSSA bacteremia Check CK 391 on 3-22 ( related to his surgery?). His CK on 3-25 was 33. Do not believe he is having dapto related myositis.  Repeat BCx ngtd 3-22 TTE (-). TEE pending.  [place] for [facility]- he wants to wait til after surgery.   R THR infection, osteomyelitis Poly-exchange, placement of beads 3-21 Rifampin added 3-23. Will check his LFTs ESR 118, CRP 29.7 Repeat wash out on 3-27  HTN Prev hypotensive, now hypertension returned.   HypoNa resolved  ARF (Cr 0.78 in August 2017) Renal u/s nl hypovolemia, ARB use Cr NL now, resolved.   Total days of antibiotics: 6  daptomycin (3-21) Rifampin (3-23)         [name] Infectious Diseases (pager) [phone] [email or web address] 01/06/2017, 3:35 PM  LOS: 6 days

## 2017-01-06 NOTE — Progress Notes (Addendum)
Pharmacy Antibiotic Note  [name] is a 56 y.o. male admitted on 12/31/2016 with MSSA R THR infection, osteomyelitis, bacteremia.  TTE negative for obvious vegetations, planning TEE.  S/p I&D and poly exchange, placement of beads 3/21.   ID following and recommends PICC placement once repeat blood cultures negative.  Pharmacy has been consulted for Daptomycin dosing.  Patient has allergy to cephalosporins associated with facial swelling.  ID added rifampin.  -  Of note, appears daptomycin dose was missed on 3/21 while patient was in transit to procedural area.  Today, 01/06/2017: - day #4 of daptomycin - afeb, scr 0.88 (crcl~100) - CK down 33 on 3/25  - plan for repeat  I&D on 3/26  Plan: - Continue daptomycin 800 mg IV q24h.   -  weekly CK  ______________________________________  Height: 5\' 7"  (170.2 cm) Weight: 250 lb (113.4 kg) IBW/kg (Calculated) : 66.1  Temp (24hrs), Avg:98.2 F (36.8 C), Min:97.9 F (36.6 C), Max:98.5 F (36.9 C)   Recent Labs Lab 12/31/16 1136 12/31/16 1510 01/01/17 0511 01/02/17 0334 01/03/17 0329 01/05/17 0551  WBC 10.6*  --  10.6* 13.9* 15.1*  --   CREATININE 1.82* 1.62* 1.58* 1.16 1.06 0.88    Estimated Creatinine Clearance: 111.3 mL/min (by C-G formula based on SCr of 0.88 mg/dL).    Allergies  Allergen Reactions  . Cephalexin Swelling    Facial swelling after po kelfex.   . Ace Inhibitors Rash  . Doxycycline Rash    Antimicrobials this admission:  3/20 vanc >>  3/21 3/20 cipro >>  3/21 3/20 flagyl >>3/21 3/21 dapto>> 3/23 rifampin >>  Dose adjustments this admission:  --  Microbiology results:  3/20 BCx: 2/2 (both bottles) MSSA FINAL 3/21 hip aspirate: MSSA 3/22 BCx: NGTD 3/22: HIV nonreactive Thank you for allowing pharmacy to be a part of this patient's care.  Lynelle Doctor 01/06/2017 10:37 AM

## 2017-01-07 ENCOUNTER — Encounter (HOSPITAL_COMMUNITY): Payer: Self-pay | Admitting: Certified Registered Nurse Anesthetist

## 2017-01-07 ENCOUNTER — Encounter (HOSPITAL_COMMUNITY)
Admission: RE | Disposition: A | Discharge status: Home Health | Payer: Self-pay | Source: Ambulatory Visit | Attending: Orthopedic Surgery

## 2017-01-07 ENCOUNTER — Inpatient Hospital Stay (HOSPITAL_COMMUNITY): Payer: Worker's Compensation | Admitting: Certified Registered Nurse Anesthetist

## 2017-01-07 DIAGNOSIS — Z9689 Presence of other specified functional implants: Secondary | ICD-10-CM

## 2017-01-07 HISTORY — PX: INCISION AND DRAINAGE HIP: SHX1801

## 2017-01-07 LAB — BASIC METABOLIC PANEL
ANION GAP: 6 (ref 5–15)
BUN: 17 mg/dL (ref 6–20)
CALCIUM: 8.2 mg/dL — AB (ref 8.9–10.3)
CO2: 24 mmol/L (ref 22–32)
CREATININE: 0.8 mg/dL (ref 0.61–1.24)
Chloride: 106 mmol/L (ref 101–111)
GFR calc Af Amer: >60 mL/min (ref >60)
GFR calc non Af Amer: >60 mL/min (ref >60)
GLUCOSE: 153 mg/dL — AB (ref 65–99)
Potassium: 3.3 mmol/L — ABNORMAL LOW (ref 3.5–5.1)
Sodium: 136 mmol/L (ref 135–145)

## 2017-01-07 LAB — CBC
HCT: 28.1 % — ABNORMAL LOW (ref 39.0–52.0)
HEMOGLOBIN: 9.6 g/dL — AB (ref 13.0–17.0)
MCH: 31.8 pg (ref 26.0–34.0)
MCHC: 34.2 g/dL (ref 30.0–36.0)
MCV: 93 fL (ref 78.0–100.0)
Platelets: 374 10*3/uL (ref 150–400)
RBC: 3.02 MIL/uL — AB (ref 4.22–5.81)
RDW: 15.2 % (ref 11.5–15.5)
WBC: 10.2 10*3/uL (ref 4.0–10.5)

## 2017-01-07 LAB — CULTURE, BLOOD (ROUTINE X 2): Culture: NO GROWTH

## 2017-01-07 SURGERY — IRRIGATION AND DEBRIDEMENT HIP
Anesthesia: General | Site: Hip | Laterality: Right

## 2017-01-07 MED ORDER — HYDROMORPHONE HCL 1 MG/ML IJ SOLN
INTRAMUSCULAR | Status: AC
  Administered 2017-01-07: 0.5 mg via INTRAVENOUS
  Filled 2017-01-07: qty 1.5

## 2017-01-07 MED ORDER — LACTATED RINGERS IV SOLN
INTRAVENOUS | Status: DC | PRN
  Administered 2017-01-07: 12:00:00 via INTRAVENOUS

## 2017-01-07 MED ORDER — MEPERIDINE HCL 50 MG/ML IJ SOLN
6.2500 mg | INTRAMUSCULAR | Status: DC | PRN
Start: 2017-01-07 — End: 2017-01-07

## 2017-01-07 MED ORDER — MIDAZOLAM HCL 2 MG/2ML IJ SOLN
INTRAMUSCULAR | Status: AC
  Filled 2017-01-07: qty 2

## 2017-01-07 MED ORDER — ESMOLOL HCL 100 MG/10ML IV SOLN
INTRAVENOUS | Status: DC | PRN
Start: 2017-01-07 — End: 2017-01-07
  Administered 2017-01-07: 10 mg via INTRAVENOUS

## 2017-01-07 MED ORDER — SUGAMMADEX SODIUM 200 MG/2ML IV SOLN
INTRAVENOUS | Status: AC
  Filled 2017-01-07: qty 2

## 2017-01-07 MED ORDER — SUCCINYLCHOLINE CHLORIDE 200 MG/10ML IV SOSY
PREFILLED_SYRINGE | INTRAVENOUS | Status: DC | PRN
  Administered 2017-01-07: 100 mg via INTRAVENOUS

## 2017-01-07 MED ORDER — LIDOCAINE 2% (20 MG/ML) 5 ML SYRINGE
INTRAMUSCULAR | Status: DC | PRN
  Administered 2017-01-07: 50 mg via INTRAVENOUS

## 2017-01-07 MED ORDER — PROPOFOL 10 MG/ML IV BOLUS
INTRAVENOUS | Status: DC | PRN
  Administered 2017-01-07: 150 mg via INTRAVENOUS
  Administered 2017-01-07: 50 mg via INTRAVENOUS

## 2017-01-07 MED ORDER — SUCCINYLCHOLINE CHLORIDE 200 MG/10ML IV SOSY
PREFILLED_SYRINGE | INTRAVENOUS | Status: AC
  Filled 2017-01-07: qty 10

## 2017-01-07 MED ORDER — ONDANSETRON HCL 4 MG/2ML IJ SOLN
INTRAMUSCULAR | Status: AC
  Filled 2017-01-07: qty 2

## 2017-01-07 MED ORDER — FENTANYL CITRATE (PF) 100 MCG/2ML IJ SOLN
INTRAMUSCULAR | Status: DC | PRN
  Administered 2017-01-07 (×5): 50 ug via INTRAVENOUS

## 2017-01-07 MED ORDER — ASPIRIN EC 325 MG PO TBEC
325.0000 mg | DELAYED_RELEASE_TABLET | Freq: Two times a day (BID) | ORAL | Status: DC
  Administered 2017-01-08 – 2017-01-09 (×2): 325 mg via ORAL
  Filled 2017-01-07 (×2): qty 1

## 2017-01-07 MED ORDER — SODIUM CHLORIDE 0.9% FLUSH: 10.0000 mL | INTRAVENOUS | Status: DC | PRN

## 2017-01-07 MED ORDER — LIDOCAINE 2% (20 MG/ML) 5 ML SYRINGE
INTRAMUSCULAR | Status: AC
  Filled 2017-01-07: qty 5

## 2017-01-07 MED ORDER — PROPOFOL 10 MG/ML IV BOLUS
INTRAVENOUS | Status: AC
  Filled 2017-01-07: qty 20

## 2017-01-07 MED ORDER — VANCOMYCIN HCL 1000 MG IV SOLR
INTRAVENOUS | Status: AC
  Filled 2017-01-07: qty 1000

## 2017-01-07 MED ORDER — HYDROMORPHONE HCL 1 MG/ML IJ SOLN
INTRAMUSCULAR | Status: DC | PRN
  Administered 2017-01-07 (×3): .4 mg via INTRAVENOUS
  Administered 2017-01-07 (×2): .2 mg via INTRAVENOUS
  Administered 2017-01-07: .4 mg via INTRAVENOUS

## 2017-01-07 MED ORDER — ROCURONIUM BROMIDE 50 MG/5ML IV SOSY
PREFILLED_SYRINGE | INTRAVENOUS | Status: AC
  Filled 2017-01-07: qty 5

## 2017-01-07 MED ORDER — VANCOMYCIN HCL 1000 MG IV SOLR
INTRAVENOUS | Status: DC | PRN
Start: 2017-01-07 — End: 2017-01-07
  Administered 2017-01-07: 1000 mg

## 2017-01-07 MED ORDER — HYDROMORPHONE HCL 1 MG/ML IJ SOLN
0.2500 mg | INTRAMUSCULAR | Status: DC | PRN
  Administered 2017-01-07 (×3): 0.5 mg via INTRAVENOUS

## 2017-01-07 MED ORDER — ROCURONIUM BROMIDE 50 MG/5ML IV SOSY
PREFILLED_SYRINGE | INTRAVENOUS | Status: DC | PRN
  Administered 2017-01-07: 20 mg via INTRAVENOUS

## 2017-01-07 MED ORDER — HYDROMORPHONE HCL 2 MG/ML IJ SOLN
INTRAMUSCULAR | Status: AC
  Filled 2017-01-07: qty 1

## 2017-01-07 MED ORDER — ESMOLOL HCL 100 MG/10ML IV SOLN
INTRAVENOUS | Status: AC
  Filled 2017-01-07: qty 10

## 2017-01-07 MED ORDER — SODIUM CHLORIDE 0.9 % IR SOLN
Status: DC | PRN
  Administered 2017-01-07: 500 mL

## 2017-01-07 MED ORDER — SODIUM CHLORIDE 0.9 % IR SOLN
Status: AC
  Filled 2017-01-07: qty 500000

## 2017-01-07 MED ORDER — ONDANSETRON HCL 4 MG/2ML IJ SOLN: 4.0000 mg | Freq: Once | INTRAMUSCULAR | Status: DC | PRN

## 2017-01-07 MED ORDER — MIDAZOLAM HCL 5 MG/5ML IJ SOLN
INTRAMUSCULAR | Status: DC | PRN
  Administered 2017-01-07: 2 mg via INTRAVENOUS

## 2017-01-07 MED ORDER — FENTANYL CITRATE (PF) 250 MCG/5ML IJ SOLN
INTRAMUSCULAR | Status: AC
  Filled 2017-01-07: qty 5

## 2017-01-07 MED ORDER — DEXAMETHASONE SODIUM PHOSPHATE 10 MG/ML IJ SOLN
INTRAMUSCULAR | Status: AC
  Filled 2017-01-07: qty 1

## 2017-01-07 MED ORDER — SUGAMMADEX SODIUM 200 MG/2ML IV SOLN
INTRAVENOUS | Status: DC | PRN
  Administered 2017-01-07: 200 mg via INTRAVENOUS

## 2017-01-07 SURGICAL SUPPLY — 37 items
BAG ZIPLOCK 12X15 (MISCELLANEOUS) ×2 IMPLANT
CEMENT HV SMART SET (Cement) IMPLANT
COVER SURGICAL LIGHT HANDLE (MISCELLANEOUS) ×2 IMPLANT
DRAPE INCISE IOBAN 85X60 (DRAPES) ×2 IMPLANT
DRAPE ORTHO SPLIT 77X108 STRL (DRAPES) ×2
DRAPE SURG 17X11 SM STRL (DRAPES) ×2 IMPLANT
DRAPE SURG ORHT 6 SPLT 77X108 (DRAPES) ×2 IMPLANT
DRAPE U-SHAPE 47X51 STRL (DRAPES) ×2 IMPLANT
DRSG MEPILEX BORDER 4X12 (GAUZE/BANDAGES/DRESSINGS) ×2 IMPLANT
DRSG MEPILEX BORDER 4X8 (GAUZE/BANDAGES/DRESSINGS) ×2 IMPLANT
DURAPREP 26ML APPLICATOR (WOUND CARE) ×2 IMPLANT
EVACUATOR 1/8 PVC DRAIN (DRAIN) ×2 IMPLANT
GLOVE BIOGEL M STRL SZ7.5 (GLOVE) ×4 IMPLANT
GLOVE BIOGEL PI IND STRL 7.5 (GLOVE) ×5 IMPLANT
GLOVE BIOGEL PI INDICATOR 7.5 (GLOVE) ×5
GLOVE ORTHO TXT STRL SZ7.5 (GLOVE) ×4 IMPLANT
GLOVE SURG ORTHO 8.0 STRL STRW (GLOVE) ×2 IMPLANT
GOWN STRL REUS W/TWL LRG LVL3 (GOWN DISPOSABLE) ×2 IMPLANT
GOWN STRL REUS W/TWL XL LVL3 (GOWN DISPOSABLE) ×4 IMPLANT
HANDPIECE INTERPULSE COAX TIP (DISPOSABLE) ×1
KIT BASIN OR (CUSTOM PROCEDURE TRAY) ×2 IMPLANT
KIT STIMULAN RAPID CURE  10CC (Orthopedic Implant) ×1 IMPLANT
KIT STIMULAN RAPID CURE 10CC (Orthopedic Implant) ×1 IMPLANT
MANIFOLD NEPTUNE II (INSTRUMENTS) ×2 IMPLANT
PACK TOTAL JOINT (CUSTOM PROCEDURE TRAY) ×2 IMPLANT
POSITIONER SURGICAL ARM (MISCELLANEOUS) ×2 IMPLANT
SET HNDPC FAN SPRY TIP SCT (DISPOSABLE) ×1 IMPLANT
STAPLER VISISTAT 35W (STAPLE) ×2 IMPLANT
SUCTION YANKAUER HANDLE (MISCELLANEOUS) ×2 IMPLANT
SUT PDS AB 1 CT1 27 (SUTURE) ×8 IMPLANT
SUT VIC AB 1 CT1 36 (SUTURE) ×4 IMPLANT
SUT VIC AB 2-0 CT1 27 (SUTURE) ×3
SUT VIC AB 2-0 CT1 TAPERPNT 27 (SUTURE) ×3 IMPLANT
SWAB CULTURE ESWAB REG 1ML (MISCELLANEOUS) ×2 IMPLANT
TOWEL OR 17X26 10 PK STRL BLUE (TOWEL DISPOSABLE) ×4 IMPLANT
TOWEL OR NON WOVEN STRL DISP B (DISPOSABLE) ×2 IMPLANT
TRAY FOLEY CATH 16FRSI W/METER (SET/KITS/TRAYS/PACK) ×2 IMPLANT

## 2017-01-07 NOTE — Progress Notes (Signed)
Orthopedic Tech Progress Note Patient Details:  [name] 1961-08-24 [id number]  Pt not on Ortho bed unable to provide OHF w/Trapeze. Patient ID: [name], male   DOB: 01-19-1961, 56 y.o.   MRN: [id number]   [name] 01/07/2017, 2:50 PM

## 2017-01-07 NOTE — Anesthesia Postprocedure Evaluation (Signed)
Anesthesia Post Note  Patient: [name]  Procedure(s) Performed: Procedure(s) (LRB): IRRIGATION AND DEBRIDEMENT HIP (Right)  Patient location during evaluation: PACU Anesthesia Type: General Level of consciousness: awake and alert Pain management: pain level controlled Vital Signs Assessment: post-procedure vital signs reviewed and stable Respiratory status: spontaneous breathing, nonlabored ventilation, respiratory function stable and patient connected to nasal cannula oxygen Cardiovascular status: blood pressure returned to baseline and stable Postop Assessment: no signs of nausea or vomiting Anesthetic complications: no       Last Vitals:  Vitals:   01/07/17 1337 01/07/17 1443  BP:  (!) 154/74  Pulse:  83  Resp: 12 16  Temp:  36.9 C    Last Pain:  Vitals:   01/07/17 1443  TempSrc: Oral  PainSc: 4                  Galdino Hinchman DAVID

## 2017-01-07 NOTE — Progress Notes (Signed)
Patient ID: [name], male   DOB: [birth year], 56 y.o.   MRN: [id number] Subjective: 6 Days Post-Op Procedure(s) (LRB): IRRIGATION AND DEBRIDEMENT RIGHT TOTAL HIP WITH HEAD AND LINER EXCHANGE AND PLACEMENT OF STIMULIN BEADS (Right)    Patient reports pain as mild to moderate  Objective:   VITALS:   Vitals:   01/06/17 2213 01/07/17 0508  BP: (!) 150/55 (!) 174/79  Pulse: 73 62  Resp: 16 16  Temp: 98.5 F (36.9 C) 98 F (36.7 C)    Neurovascular intact  Right hip with swelling and surrounding erythema versus cellutlitis  LABS  Recent Labs  01/07/17 0419  HGB 9.6*  HCT 28.1*  WBC 10.2  PLT 374     Recent Labs  01/05/17 0551 01/06/17 2351  NA 138 136  K 3.6 3.3*  BUN 17 17  CREATININE 0.88 0.80  GLUCOSE 135* 153*    No results for input(s): LABPT, INR in the last 72 hours.   Assessment/Plan: 6 Days Post-Op Procedure(s) (LRB): IRRIGATION AND DEBRIDEMENT RIGHT TOTAL HIP WITH HEAD AND LINER EXCHANGE AND PLACEMENT OF STIMULIN BEADS (Right)  Plan: As previous noted I intend on taking [name] back to the OR today for a repeat I&D in effort to preserve and salvage his current arthroplasty given the relative acuity of presentation and the complexity of previous surgeries on his hip

## 2017-01-07 NOTE — Progress Notes (Signed)
INFECTIOUS DISEASE PROGRESS NOTE  ID: [name] is a 56 y.o. male with  Active Problems:   Essential hypertension   Acute hyponatremia   Acute renal disease   Prosthetic hip infection (HCC)   S/P hip replacement  Subjective: Without complaints  Abtx:  Anti-infectives    Start     Dose/Rate Route Frequency Ordered Stop   01/07/17 1203  vancomycin (VANCOCIN) powder  Status:  Discontinued       As needed 01/07/17 1221 01/07/17 1304   01/07/17 1115  polymyxin B 500,000 Units, bacitracin 50,000 Units in sodium chloride irrigation 0.9 % 500 mL irrigation  Status:  Discontinued       As needed 01/07/17 1116 01/07/17 1304   01/03/17 1000  rifampin (RIFADIN) capsule 300 mg     300 mg Oral Daily 01/03/17 0833     01/01/17 2100  vancomycin (VANCOCIN) 1,250 mg in sodium chloride 0.9 % 250 mL IVPB  Status:  Discontinued     1,250 mg 166.7 mL/hr over 90 Minutes Intravenous Every 24 hours 12/31/16 2020 01/01/17 1542   01/01/17 1700  DAPTOmycin (CUBICIN) 800 mg in sodium chloride 0.9 % IVPB     800 mg 232 mL/hr over 30 Minutes Intravenous Every 24 hours 01/01/17 1605     01/01/17 0600  ciprofloxacin (CIPRO) IVPB 400 mg  Status:  Discontinued     400 mg 200 mL/hr over 60 Minutes Intravenous Every 12 hours 12/31/16 1806 01/01/17 1542   12/31/16 1700  metroNIDAZOLE (FLAGYL) IVPB 500 mg  Status:  Discontinued     500 mg 100 mL/hr over 60 Minutes Intravenous Every 8 hours 12/31/16 1551 01/01/17 1542   12/31/16 1600  ciprofloxacin (CIPRO) IVPB 400 mg     400 mg 200 mL/hr over 60 Minutes Intravenous STAT 12/31/16 1558 12/31/16 1901   12/31/16 1600  vancomycin (VANCOCIN) 2,000 mg in sodium chloride 0.9 % 500 mL IVPB     2,000 mg 250 mL/hr over 120 Minutes Intravenous STAT 12/31/16 1559 01/01/17 0004   12/31/16 1415  vancomycin (VANCOCIN) 1,000 mg in sodium chloride 0.9 % 250 mL IVPB  Status:  Discontinued     1,000 mg 250 mL/hr over 60 Minutes Intravenous  Once 12/31/16 1407 12/31/16 1408   12/31/16 1408  vancomycin (VANCOCIN) IVPB 1000 mg/200 mL premix  Status:  Discontinued     1,000 mg 200 mL/hr over 60 Minutes Intravenous 60 min pre-op 12/31/16 1408 12/31/16 1536   12/31/16 1408  vancomycin (VANCOCIN) 1-5 GM/200ML-% IVPB  Status:  Discontinued    Comments:  Harvell, Gwendolyn  : cabinet override      12/31/16 1408 12/31/16 1558      Medications:  Scheduled: . amLODipine  2.5 mg Oral Daily  . [START ON 01/08/2017] aspirin EC  325 mg Oral BID  . celecoxib  200 mg Oral Q12H  . DAPTOmycin (CUBICIN)  IV  800 mg Intravenous Q24H  . docusate sodium  100 mg Oral BID  . ferrous sulfate  325 mg Oral TID PC  . multivitamin with minerals  1 tablet Oral Daily  . mupirocin ointment   Nasal BID  . polyethylene glycol  17 g Oral BID  . protein supplement shake  11 oz Oral BID BM  . rifampin  300 mg Oral Daily  . senna  1 tablet Oral BID  . Vitamin D (Ergocalciferol)  50,000 Units Oral Q Sat    Objective: Vital signs in last 24 hours: Temp:  [97.9 F (36.6  C)-98.5 F (36.9 C)] 98.3 F (36.8 C) (03/27 1549) Pulse Rate:  [60-94] 78 (03/27 1549) Resp:  [12-19] 18 (03/27 1549) BP: (150-177)/(55-87) 150/74 (03/27 1549) SpO2:  [97 %-100 %] 100 % (03/27 1549)   General appearance: alert, cooperative and no distress Resp: clear to auscultation bilaterally Cardio: regular rate and rhythm GI: normal findings: bowel sounds normal, soft, non-tender and distended Extremities: R hip with drain in place, dressings clean.   Lab Results  Recent Labs  01/05/17 0551 01/06/17 2351 01/07/17 0419  WBC  --   --  10.2  HGB  --   --  9.6*  HCT  --   --  28.1*  NA 138 136  --   K 3.6 3.3*  --   CL 110 106  --   CO2 22 24  --   BUN 17 17  --   CREATININE 0.88 0.80  --    Liver Panel  Recent Labs  01/06/17 1615  PROT 5.8*  ALBUMIN 2.3*  AST 24  ALT <5*  ALKPHOS 113  BILITOT 0.8  BILIDIR 0.3  IBILI 0.5   Sedimentation Rate No results for input(s): ESRSEDRATE in the last  72 hours. C-Reactive Protein No results for input(s): CRP in the last 72 hours.  Microbiology: Recent Results (from the past 240 hour(s))  Culture, blood (routine x 2)     Status: Abnormal   Collection Time: 12/31/16  5:35 PM  Result Value Ref Range Status   Specimen Description BLOOD BLOOD RIGHT ARM  Final   Special Requests BOTTLES DRAWN AEROBIC AND ANAEROBIC 7 CC EA  Final   Culture  Setup Time   Final    GRAM POSITIVE COCCI IN CLUSTERS IN BOTH AEROBIC AND ANAEROBIC BOTTLES CRITICAL RESULT CALLED TO, READ BACK BY AND VERIFIED WITH: EBurman Foster.D. 12:25 01/01/17 ([email or web address]) Performed at [facility], [place] [address], [place], [place] [address]    Culture STAPHYLOCOCCUS AUREUS (A)  Final   Report Status 01/03/2017 FINAL  Final   Organism ID, Bacteria STAPHYLOCOCCUS AUREUS  Final      Susceptibility   Staphylococcus aureus - MIC*    CIPROFLOXACIN <=0.5 SENSITIVE Sensitive     ERYTHROMYCIN <=0.25 SENSITIVE Sensitive     GENTAMICIN <=0.5 SENSITIVE Sensitive     OXACILLIN 0.5 SENSITIVE Sensitive     TETRACYCLINE <=1 SENSITIVE Sensitive     VANCOMYCIN 1 SENSITIVE Sensitive     TRIMETH/SULFA <=10 SENSITIVE Sensitive     CLINDAMYCIN <=0.25 SENSITIVE Sensitive     RIFAMPIN <=0.5 SENSITIVE Sensitive     Inducible Clindamycin NEGATIVE Sensitive     * STAPHYLOCOCCUS AUREUS  Blood Culture ID Panel (Reflexed)     Status: Abnormal   Collection Time: 12/31/16  5:35 PM  Result Value Ref Range Status   Enterococcus species NOT DETECTED NOT DETECTED Final   Listeria monocytogenes NOT DETECTED NOT DETECTED Final   Staphylococcus species DETECTED (A) NOT DETECTED Final    Comment: CRITICAL RESULT CALLED TO, READ BACK BY AND VERIFIED WITH: EBurman Foster.D. 12:25 01/01/17 ([email or web address])    Staphylococcus aureus DETECTED (A) NOT DETECTED Final    Comment: Methicillin (oxacillin) susceptible Staphylococcus aureus (MSSA). Preferred therapy is anti staphylococcal beta lactam antibiotic  (Cefazolin or Nafcillin), unless clinically contraindicated. CRITICAL RESULT CALLED TO, READ BACK BY AND VERIFIED WITH: EBurman Foster.D. 12:25 01/01/17 ([email or web address])    Methicillin resistance NOT DETECTED NOT DETECTED Final   Streptococcus species NOT DETECTED NOT DETECTED Final   Streptococcus  agalactiae NOT DETECTED NOT DETECTED Final   Streptococcus pneumoniae NOT DETECTED NOT DETECTED Final   Streptococcus pyogenes NOT DETECTED NOT DETECTED Final   Acinetobacter baumannii NOT DETECTED NOT DETECTED Final   Enterobacteriaceae species NOT DETECTED NOT DETECTED Final   Enterobacter cloacae complex NOT DETECTED NOT DETECTED Final   Escherichia coli NOT DETECTED NOT DETECTED Final   Klebsiella oxytoca NOT DETECTED NOT DETECTED Final   Klebsiella pneumoniae NOT DETECTED NOT DETECTED Final   Proteus species NOT DETECTED NOT DETECTED Final   Serratia marcescens NOT DETECTED NOT DETECTED Final   Haemophilus influenzae NOT DETECTED NOT DETECTED Final   Neisseria meningitidis NOT DETECTED NOT DETECTED Final   Pseudomonas aeruginosa NOT DETECTED NOT DETECTED Final   Candida albicans NOT DETECTED NOT DETECTED Final   Candida glabrata NOT DETECTED NOT DETECTED Final   Candida krusei NOT DETECTED NOT DETECTED Final   Candida parapsilosis NOT DETECTED NOT DETECTED Final   Candida tropicalis NOT DETECTED NOT DETECTED Final    Comment: Performed at [facility], [place] [address] [place], [place] [address]  Culture, blood (routine x 2)     Status: Abnormal   Collection Time: 12/31/16  5:45 PM  Result Value Ref Range Status   Specimen Description BLOOD BLOOD RIGHT ARM  Final   Special Requests BOTTLES DRAWN AEROBIC AND ANAEROBIC 5.5 CC EA  Final   Culture  Setup Time   Final    GRAM POSITIVE COCCI IN BOTH AEROBIC AND ANAEROBIC BOTTLES CRITICAL VALUE NOTED.  VALUE IS CONSISTENT WITH PREVIOUSLY REPORTED AND CALLED VALUE.    Culture (A)  Final    STAPHYLOCOCCUS AUREUS SUSCEPTIBILITIES PERFORMED  ON PREVIOUS CULTURE WITHIN THE LAST 5 DAYS. Performed at [facility], [place] [address] [place], [place] [address]    Report Status 01/03/2017 FINAL  Final  Surgical PCR screen     Status: Abnormal   Collection Time: 01/01/17  2:11 PM  Result Value Ref Range Status   MRSA, PCR NEGATIVE NEGATIVE Final   Staphylococcus aureus POSITIVE (A) NEGATIVE Final    Comment:        The Xpert SA Assay (FDA approved for NASAL specimens in patients over 49 years of age), is one component of a comprehensive surveillance program.  Test performance has been validated by [facility] for patients greater than or equal to 65 year old. It is not intended to diagnose infection nor to guide or monitor treatment.   Aerobic/Anaerobic Culture (surgical/deep wound)     Status: None   Collection Time: 01/01/17  6:51 PM  Result Value Ref Range Status   Specimen Description SYNOVIAL RIGHT HIP  Final   Special Requests FLUID ON SWAB  Final   Gram Stain   Final    ABUNDANT WBC PRESENT,BOTH PMN AND MONONUCLEAR FEW GRAM POSITIVE COCCI IN PAIRS    Culture   Final    MODERATE STAPHYLOCOCCUS AUREUS NO ANAEROBES ISOLATED Performed at [facility], [place] [address], [place], [place] [address]    Report Status 01/06/2017 FINAL  Final   Organism ID, Bacteria STAPHYLOCOCCUS AUREUS  Final      Susceptibility   Staphylococcus aureus - MIC*    CIPROFLOXACIN <=0.5 SENSITIVE Sensitive     ERYTHROMYCIN <=0.25 SENSITIVE Sensitive     GENTAMICIN <=0.5 SENSITIVE Sensitive     OXACILLIN 0.5 SENSITIVE Sensitive     TETRACYCLINE <=1 SENSITIVE Sensitive     VANCOMYCIN <=0.5 SENSITIVE Sensitive     TRIMETH/SULFA <=10 SENSITIVE  Sensitive     CLINDAMYCIN <=0.25 SENSITIVE Sensitive     RIFAMPIN <=0.5 SENSITIVE Sensitive     Inducible Clindamycin NEGATIVE Sensitive     * MODERATE STAPHYLOCOCCUS AUREUS  MRSA PCR Screening     Status: None   Collection Time: 01/01/17 10:16 PM  Result Value Ref Range Status   MRSA by  PCR NEGATIVE NEGATIVE Final    Comment:        The GeneXpert MRSA Assay (FDA approved for NASAL specimens only), is one component of a comprehensive MRSA colonization surveillance program. It is not intended to diagnose MRSA infection nor to guide or monitor treatment for MRSA infections.   Culture, blood (Routine X 2) w Reflex to ID Panel     Status: None   Collection Time: 01/02/17  8:13 AM  Result Value Ref Range Status   Specimen Description BLOOD LEFT ARM  Final   Special Requests BOTTLES DRAWN AEROBIC AND ANAEROBIC 10CC  Final   Culture   Final    NO GROWTH 5 DAYS Performed at [facility], [place] [address] [place], [place] [address]    Report Status 01/07/2017 FINAL  Final    Studies/Results: No results found.   Assessment/Plan: MSSA bacteremia Continue dapto, rifampin Repeat BCx 3-22 negative TTE (-). TEE pending tomorrow.  [place] for [facility]- pending.  Plan for 6 weeks of anbx  R THR infection, osteomyelitis Poly-exchange, placement of beads 3-21 Rifampin added 3-23. LFTs normal (3-26) ESR 118, CRP 29.7 Repeat wash out 3-27  HTN Prev hypotensive, now hypertension returned.  Per primary team  Total days of antibiotics: 7  daptomycin (3-21) Rifampin (3-23)         [name] Infectious Diseases (pager) [phone] [email or web address] 01/07/2017, 4:54 PM  LOS: 7 days

## 2017-01-07 NOTE — Anesthesia Procedure Notes (Signed)
Procedure Name: Intubation Date/Time: 01/07/2017 11:30 AM Performed by: Deliah Boston Pre-anesthesia Checklist: Patient identified, Emergency Drugs available, Suction available and Patient being monitored Patient Re-evaluated:Patient Re-evaluated prior to inductionOxygen Delivery Method: Circle system utilized Preoxygenation: Pre-oxygenation with 100% oxygen Intubation Type: IV induction Ventilation: Mask ventilation without difficulty Laryngoscope Size: Mac and 4 Grade View: Grade II Tube type: Oral Tube size: 7.5 mm Number of attempts: 1 Airway Equipment and Method: Stylet and Oral airway Placement Confirmation: ETT inserted through vocal cords under direct vision,  positive ETCO2 and breath sounds checked- equal and bilateral Secured at: 22 cm Tube secured with: Tape Dental Injury: Teeth and Oropharynx as per pre-operative assessment

## 2017-01-07 NOTE — Progress Notes (Signed)
Peripherally Inserted Central Catheter/Midline Placement  The IV Nurse has discussed with the patient and/or persons authorized to consent for the patient, the purpose of this procedure and the potential benefits and risks involved with this procedure.  The benefits include less needle sticks, lab draws from the catheter, and the patient may be discharged home with the catheter. Risks include, but not limited to, infection, bleeding, blood clot (thrombus formation), and puncture of an artery; nerve damage and irregular heartbeat and possibility to perform a PICC exchange if needed/ordered by physician.  Alternatives to this procedure were also discussed.  Bard Power PICC patient education guide, fact sheet on infection prevention and patient information card has been provided to patient /or left at bedside.    PICC/Midline Placement Documentation  PICC Single Lumen 01/07/17 PICC Right Brachial 39 cm 0 cm (Active)  Indication for Insertion or Continuance of Line Home intravenous therapies (PICC only) 01/07/2017  6:00 PM  Exposed Catheter (cm) 0 cm 01/07/2017  6:00 PM  Dressing Change Due 01/14/17 01/07/2017  6:00 PM       [name] 01/07/2017, 6:41 PM

## 2017-01-07 NOTE — Progress Notes (Signed)
PT Cancellation Note  Patient Details Name: [name] MRN: [id number] DOB: 12/16/1960   Cancelled Treatment:     The patient is scheduled for surgery today. Will continue PT after surgery as ordered by physician.    Claretha Cooper 01/07/2017, 7:10 AM [facility] PT [phone]

## 2017-01-07 NOTE — Anesthesia Preprocedure Evaluation (Signed)
Anesthesia Evaluation  Patient identified by MRN, date of birth, ID band Patient awake    Reviewed: Allergy & Precautions, NPO status , Patient's Chart, lab work & pertinent test results  History of Anesthesia Complications (+) PONV  Airway Mallampati: I   Neck ROM: Full    Dental   Pulmonary former smoker,    Pulmonary exam normal        Cardiovascular hypertension, Pt. on medications Normal cardiovascular exam     Neuro/Psych    GI/Hepatic   Endo/Other    Renal/GU Renal InsufficiencyRenal disease     Musculoskeletal   Abdominal   Peds  Hematology   Anesthesia Other Findings   Reproductive/Obstetrics                             Anesthesia Physical Anesthesia Plan  ASA: III  Anesthesia Plan: General   Post-op Pain Management:    Induction: Intravenous  Airway Management Planned: Oral ETT  Additional Equipment:   Intra-op Plan:   Post-operative Plan: Extubation in OR  Informed Consent: I have reviewed the patients History and Physical, chart, labs and discussed the procedure including the risks, benefits and alternatives for the proposed anesthesia with the patient or authorized representative who has indicated his/her understanding and acceptance.     Plan Discussed with: CRNA and Surgeon  Anesthesia Plan Comments:         Anesthesia Quick Evaluation

## 2017-01-07 NOTE — Transfer of Care (Signed)
Immediate Anesthesia Transfer of Care Note  Patient: [name]  Procedure(s) Performed: Procedure(s): IRRIGATION AND DEBRIDEMENT HIP (Right)  Patient Location: PACU  Anesthesia Type:General  Level of Consciousness: Patient easily awoken, sedated, comfortable, cooperative, following commands, responds to stimulation.   Airway & Oxygen Therapy: Patient spontaneously breathing, ventilating well, oxygen via simple oxygen mask.  Post-op Assessment: Report given to PACU RN, vital signs reviewed and stable, moving all extremities.   Post vital signs: Reviewed and stable.  Complications: No apparent anesthesia complications  Last Vitals:  Vitals:   01/07/17 0841 01/07/17 0900  BP: (!) 175/71 (!) 177/63  Pulse: 60 67  Resp:  17  Temp:  36.6 C    Last Pain:  Vitals:   01/07/17 0900  TempSrc: Oral  PainSc:       Patients Stated Pain Goal: 3 (12/52/71 2929)  Complications: No apparent anesthesia complications

## 2017-01-07 NOTE — Progress Notes (Signed)
    [facility] has been requested to perform a transesophageal echocardiogram on 01/08/17 for bacteremia.  After careful review of history and examination, the risks and benefits of transesophageal echocardiogram have been explained including risks of esophageal damage, perforation (1:10,000 risk), bleeding, pharyngeal hematoma as well as other potential complications associated with conscious sedation including aspiration, arrhythmia, respiratory failure and death. Alternatives to treatment were discussed, questions were answered. Patient is willing to proceed.   Reino Bellis, NP-C 01/07/2017 2:24 PM

## 2017-01-07 NOTE — Progress Notes (Signed)
Patient rescheduled for TEE on Wednesday 3.28.18 @ 1400 with anesthesia. Spoke with Amy, RN  and informed RN to set up carelink to transport patient to [facility] and to arrive to unit by 1230. Cardiac PA notified to place orders for procedure tomorrow.

## 2017-01-07 NOTE — Op Note (Signed)
NAMEDEMONTEZ, NOVACK                ACCOUNT NO.:  [id number]  MEDICAL RECORD NO.:  [id number]  LOCATION:  45                         FACILITY:  [facility]  PHYSICIAN:  Pietro Cassis. Alvan Dame, M.D.  DATE OF BIRTH:  [birth year]  DATE OF PROCEDURE:  01/06/2017 DATE OF DISCHARGE:                              OPERATIVE REPORT   PREOPERATIVE DIAGNOSIS:  Status post excisional debridement of an infected right total hip arthroplasty with swelling with differential of a seroma versus recurrent infection.  POSTOPERATIVE DIAGNOSIS:  Right hip postoperative seroma.  PROCEDURES: 1. Excisional and nonexcisional debridement of right hip involving the     tendons incision, removing further nonviable skin, subcutaneous     tissue, old sutures and then irrigating with 6 liters of normal     saline solution. 2. Placement of Stimulan antibiotic laden beads with vancomycin.  SURGEON:  Pietro Cassis. Alvan Dame, M.D.  ASSISTANT:  Nehemiah Massed, PA-C.  ANESTHESIA:  General.  SPECIMENS:  None taken.  BLOOD LOSS:  About 200 mL.  DRAINS:  One medium Hemovac.  INDICATION FOR PROCEDURE:  Mr. Urizar is a 56 year old male who presented to the office about 10 days ago with right hip pain that began without warning.  Over the weekend, he had steadily gotten worse, his labs were worrisome for infection.  He was brought to the hospital and initially planned for surgery, but deemed to be medically unstable at that time. He was placed on antibiotics and subsequently brought to the operating room the next day when his labs were improving.  He was taken to the operating room on January 01, 2017, where an I and D was performed, identifying significant purulence in his right hip.  At that time, did an I and D excisionally and non-excisionally as well as exchange of the femoral head and acetabular liner.  I had kept him in the hospital, he has been on IV Cubicin and rifampin.  He did have some swelling of right thigh with some  erythematous changes, but based on the complexity of his right hip, this has been placed and replaced.  I am trying my best to salvage his joint.  In the interim, he also has diagnosis of Methicillin- sensitive Staph aureus bacteremia.  Consent was obtained for management in the above-noted fashion as I had discussion with him at length.  Risk of recurrent infection is a primary issue as well as DVT and need for future surgeries.  PROCEDURE IN DETAIL:  The patient was brought to the operative theater. Once adequate anesthesia, preoperative antibiotics had already been administered at therapeutic level with Cubicin and rifampin, he was positioned into the left lateral decubitus position with the right side up.  Right lower extremity was then prepped and draped in sterile fashion.  Time-out was performed identifying the patient, planned procedure and extremity.  I removed the upper portion of the most proximal aspect of the staple line and incised the suture line, identifying probably a 200-300 mL seroma in the subcutaneous layer.  His fascia lata had remained intact since surgery.  After I had evacuated all the seroma, I did irrigate it with about 1500 mL of normal saline solution into this  layer.  I then used the new deep sharp blade and excised a handful of the interrupted PDS sutures holding the iliotibial band and gluteal fascia together.  Again, encountered nothing, but blood in this area, the seroma versus acute blood.  At this point, we irrigated the hip with a total of 6 L normal saline solution and Stimulan beads were placed on the back table.  Once I finished irrigating and debriding, I placed some of the Stimulan beads deep into the intra-articular space.  I then reapproximated the iliotibial band and gluteal fascia using #1 PDS in interrupted fashion. The remainder of the Stimulan beads were placed in the subcutaneous layer.  At this point, I did elect to place a Hemovac  drain deep into the subcutaneous layer to try to prevent some of the seroma formation laterally.  Once this was placed, the subcutaneous layer was reapproximated using 2- 0 Vicryl and the remainder of the wound with staples.  The hip wound was cleaned, dried and dressed sterilely with a Mepilex dressing.  He was then extubated and brought to the recovery room in stable condition, tolerating the procedure well.     Pietro Cassis Alvan Dame, M.D.     MDO/MEDQ  D:  01/07/2017  T:  01/07/2017  Job:  [id number]

## 2017-01-07 NOTE — Progress Notes (Signed)
Cm received call from pt's Worker's Comp case Freight forwarder from [place] [employer] Krystal Clark [phone]) stating when orders are placed by MD for [facility] (PICC IV ABX) and orders for medication, to please fax to her at (fax: [phone]).  CM will follow for these orders.

## 2017-01-07 NOTE — Brief Op Note (Signed)
01/07/2017  9:07 AM  PATIENT:  [name]  56 y.o. male  PRE-OPERATIVE DIAGNOSIS:  Infected right total hip replacement, status post I&D  POST-OPERATIVE DIAGNOSIS:  RIGHT HIP POST OPERATIVE SEROMA  PROCEDURE:  Procedure(s): IRRIGATION AND DEBRIDEMENT HIP (Right)  SURGEON:  Surgeon(s) and Role:    * Paralee Cancel, MD - Primary  PHYSICIAN ASSISTANT: Nehemiah Massed, PA-C  ANESTHESIA:   general  EBL:  Total I/O In: 10 [P.O.:10] Out: 425 [Urine:425]  BLOOD ADMINISTERED:none  DRAINS: (1 medium) Hemovact drain(s) in the right hip superficially with  Suction Open   LOCAL MEDICATIONS USED:  NONE  SPECIMEN:  No Specimen  DISPOSITION OF SPECIMEN:  N/A  COUNTS:  YES  TOURNIQUET:  * No tourniquets in log *  DICTATION: .Other Dictation: Dictation Number [id number]  PLAN OF CARE: Admit to inpatient   PATIENT DISPOSITION:  PACU - hemodynamically stable.   Delay start of Pharmacological VTE agent (>24hrs) due to surgical blood loss or risk of bleeding: no

## 2017-01-08 ENCOUNTER — Inpatient Hospital Stay (HOSPITAL_COMMUNITY): Payer: Worker's Compensation | Admitting: Certified Registered"

## 2017-01-08 ENCOUNTER — Encounter (HOSPITAL_COMMUNITY): Payer: Self-pay | Admitting: Orthopedic Surgery

## 2017-01-08 ENCOUNTER — Inpatient Hospital Stay (HOSPITAL_COMMUNITY): Payer: Worker's Compensation

## 2017-01-08 ENCOUNTER — Encounter (HOSPITAL_COMMUNITY)
Admission: RE | Disposition: A | Discharge status: Home Health | Payer: Self-pay | Source: Ambulatory Visit | Attending: Orthopedic Surgery

## 2017-01-08 DIAGNOSIS — Z95828 Presence of other vascular implants and grafts: Secondary | ICD-10-CM

## 2017-01-08 DIAGNOSIS — R7881 Bacteremia: Secondary | ICD-10-CM

## 2017-01-08 HISTORY — PX: TEE WITHOUT CARDIOVERSION: SHX5443

## 2017-01-08 LAB — CBC
HCT: 27 % — ABNORMAL LOW (ref 39.0–52.0)
Hemoglobin: 9.4 g/dL — ABNORMAL LOW (ref 13.0–17.0)
MCH: 32.6 pg (ref 26.0–34.0)
MCHC: 34.8 g/dL (ref 30.0–36.0)
MCV: 93.8 fL (ref 78.0–100.0)
PLATELETS: 412 10*3/uL — AB (ref 150–400)
RBC: 2.88 MIL/uL — ABNORMAL LOW (ref 4.22–5.81)
RDW: 14.8 % (ref 11.5–15.5)
WBC: 8.8 10*3/uL (ref 4.0–10.5)

## 2017-01-08 LAB — BASIC METABOLIC PANEL
Anion gap: 7 (ref 5–15)
BUN: 13 mg/dL (ref 6–20)
CO2: 24 mmol/L (ref 22–32)
CREATININE: 0.73 mg/dL (ref 0.61–1.24)
Calcium: 8.4 mg/dL — ABNORMAL LOW (ref 8.9–10.3)
Chloride: 102 mmol/L (ref 101–111)
GFR calc Af Amer: >60 mL/min (ref >60)
GLUCOSE: 128 mg/dL — AB (ref 65–99)
POTASSIUM: 3.6 mmol/L (ref 3.5–5.1)
Sodium: 133 mmol/L — ABNORMAL LOW (ref 135–145)

## 2017-01-08 SURGERY — ECHOCARDIOGRAM, TRANSESOPHAGEAL
Anesthesia: Monitor Anesthesia Care

## 2017-01-08 MED ORDER — DAPTOMYCIN IV (FOR PTA / DISCHARGE USE ONLY): 800.0000 mg | INTRAVENOUS | 0 refills | Status: DC

## 2017-01-08 MED ORDER — OXYCODONE HCL 5 MG PO TABS: 5.0000 mg | ORAL_TABLET | Freq: Once | ORAL | Status: DC | PRN

## 2017-01-08 MED ORDER — SODIUM CHLORIDE 0.9 % IV SOLN: INTRAVENOUS | Status: DC

## 2017-01-08 MED ORDER — PROPOFOL 500 MG/50ML IV EMUL
INTRAVENOUS | Status: DC | PRN
  Administered 2017-01-08: 100 ug/kg/min via INTRAVENOUS

## 2017-01-08 MED ORDER — SODIUM CHLORIDE 0.9 % IV SOLN
INTRAVENOUS | Status: DC | PRN
  Administered 2017-01-08: 14:00:00 via INTRAVENOUS

## 2017-01-08 MED ORDER — OXYCODONE HCL 5 MG/5ML PO SOLN: 5.0000 mg | Freq: Once | ORAL | Status: DC | PRN

## 2017-01-08 MED ORDER — FENTANYL CITRATE (PF) 100 MCG/2ML IJ SOLN
25.0000 ug | INTRAMUSCULAR | Status: DC | PRN
  Administered 2017-01-08: 50 ug via INTRAVENOUS

## 2017-01-08 MED ORDER — BUTAMBEN-TETRACAINE-BENZOCAINE 2-2-14 % EX AERO
INHALATION_SPRAY | CUTANEOUS | Status: DC | PRN
  Administered 2017-01-08: 2 via TOPICAL

## 2017-01-08 MED ORDER — PROPOFOL 10 MG/ML IV BOLUS
INTRAVENOUS | Status: DC | PRN
  Administered 2017-01-08 (×4): 20 mg via INTRAVENOUS
  Administered 2017-01-08: 30 mg via INTRAVENOUS

## 2017-01-08 MED ORDER — ONDANSETRON HCL 4 MG/2ML IJ SOLN: 4.0000 mg | Freq: Once | INTRAMUSCULAR | Status: DC | PRN

## 2017-01-08 MED ORDER — FENTANYL CITRATE (PF) 100 MCG/2ML IJ SOLN
INTRAMUSCULAR | Status: AC
  Filled 2017-01-08: qty 2

## 2017-01-08 NOTE — Progress Notes (Signed)
[facility] is following pt for home IV ABX with ID team. Discussed case with Leona Carry, RN, CM at [place]. [facility] will await final authorization from [employer] Case Freight forwarder to hopefully obtain approval to provide services for home.   If patient discharges after hours, please call [phone].   Larry Sierras 01/08/2017, 12:03 PM

## 2017-01-08 NOTE — Progress Notes (Addendum)
     Subjective: 1 Day Post-Op Procedure(s) (LRB): IRRIGATION AND DEBRIDEMENT HIP (Right)   Patient reports pain as mild, pain controlled.  No events throughout the night.  Plan is for a him to be brought to [facility] today for a TEE.  Dr. Alvan Dame discussed that if everything is clear with the test today and ID recommendations on board that the plan would be for him to probably be discharged home tomorrow.   Objective:   VITALS:   Vitals:   01/08/17 0130 01/08/17 0613  BP: 139/60 (!) 166/64  Pulse: 72 75  Resp: 18 17  Temp: 97.6 F (36.4 C) 98.1 F (36.7 C)    Dorsiflexion/Plantar flexion intact Incision: dressing C/D/I No cellulitis present Compartment soft  LABS  Recent Labs  01/07/17 0419 01/08/17 0618  HGB 9.6* 9.4*  HCT 28.1* 27.0*  WBC 10.2 8.8  PLT 374 412*     Recent Labs  01/06/17 2351 01/08/17 0618  NA 136 133*  K 3.3* 3.6  BUN 17 13  CREATININE 0.80 0.73  GLUCOSE 153* 128*     Assessment/Plan: 1 Day Post-Op Procedure(s) (LRB): IRRIGATION AND DEBRIDEMENT HIP (Right)  Up with therapy if/when able TEE today Discharge home with home health RN for medication management.   Per ID and Pharmacy setting up the patient will be on:  daptomycin (CUBICIN) IVPB Inject 800 mg into the vein daily. Indication: MSSA right THR infection and bacteremia Last Day of Therapy: 02/12/2017 Labs - Once weekly: CBC/D, BMP, and CPK Labs - Every other week: ESR and CRP, Starting Wed 01/08/2017, Until Wed 02/12/2017     West Pugh. Arbell Wycoff   PAC  01/08/2017, 8:36 AM

## 2017-01-08 NOTE — CV Procedure (Signed)
TEE Anesthesia;  Joslin Propofol  Significant secretions and coughing spasm Sats remained above 90%  Normal valves No SBE EF 65% No ASD Normal aorta  Jenkins Rouge

## 2017-01-08 NOTE — Progress Notes (Signed)
Occupational Therapy Treatment Patient Details Name: [name] MRN: [id number] DOB: 1960/11/29 Today's Date: 01/08/2017    History of present illness 56 year old man s/p R hip I & D and liner exchange.  3/27 s/p debridement and placement of antibiotic beads   OT comments  Pt tolerated session well.  Plan is for TEE at home  Follow Up Recommendations  Supervision/Assistance - 24 hour    Equipment Recommendations  None recommended by OT    Recommendations for Other Services      Precautions / Restrictions Precautions Precautions: Fall;Posterior Hip Restrictions RUE Weight Bearing: Weight bearing as tolerated       Mobility Bed Mobility         Supine to sit: Min assist     General bed mobility comments: assist for RLE  Transfers   Equipment used: Rolling walker (2 wheeled)   Sit to Stand: Min assist         General transfer comment: steadying assistance and cues for UE/LE placement    Balance                                           ADL either performed or assessed with clinical judgement   ADL                           Toilet Transfer: Minimal assistance;Ambulation;BSC;RW             General ADL Comments: ambulated to bathroom and sat on 3:1 commode. This was pt's first time up since debridement.  He stood and washed buttocks:  cued for posterior hip precautions initially as he was attempting to wash in sitting.  Performed grooming from seated position     Vision       Perception     Praxis      Cognition Arousal/Alertness: Awake/alert Behavior During Therapy: WFL for tasks assessed/performed Overall Cognitive Status: Within Functional Limits for tasks assessed                                          Exercises     Shoulder Instructions       General Comments      Pertinent Vitals/ Pain       Pain Assessment: Faces Faces Pain Scale: Hurts little more Pain Location: R hip Pain  Descriptors / Indicators: Sore Pain Intervention(s): Limited activity within patient's tolerance;Monitored during session;Premedicated before session;Repositioned;Ice applied  Home Living                                          Prior Functioning/Environment              Frequency  Min 2X/week        Progress Toward Goals  OT Goals(current goals can now be found in the care plan section)  Progress towards OT goals: Progressing toward goals     Plan      Co-evaluation                 End of Session    OT Visit Diagnosis: Pain Pain - Right/Left: Right Pain - part of  body: Hip   Activity Tolerance Patient tolerated treatment well   Patient Left in chair;with call bell/phone within reach   Nurse Communication          Time: 4353-9122 OT Time Calculation (min): 31 min  Charges: OT General Charges $OT Visit: 1 Procedure OT Treatments $Self Care/Home Management : 23-37 mins  Lesle Chris, OTR/L [phone] 01/08/2017   [place] 01/08/2017, 11:31 AM

## 2017-01-08 NOTE — Interval H&P Note (Signed)
History and Physical Interval Note:  01/08/2017 1:08 PM  [name]  has presented today for surgery, with the diagnosis of bacteremia  The various methods of treatment have been discussed with the patient and family. After consideration of risks, benefits and other options for treatment, the patient has consented to  Procedure(s): TRANSESOPHAGEAL ECHOCARDIOGRAM (TEE) (N/A) as a surgical intervention .  The patient's history has been reviewed, patient examined, no change in status, stable for surgery.  I have reviewed the patient's chart and labs.  Questions were answered to the patient's satisfaction.     [name]

## 2017-01-08 NOTE — Progress Notes (Signed)
INFECTIOUS DISEASE PROGRESS NOTE  ID: [name] is a 56 y.o. male with  Active Problems:   Essential hypertension   Acute hyponatremia   Acute renal disease   Prosthetic hip infection (HCC)   S/P hip replacement  Subjective: Without complaints.   Abtx:  Anti-infectives    Start     Dose/Rate Route Frequency Ordered Stop   01/08/17 0000  daptomycin (CUBICIN) IVPB  Status:  Discontinued     800 mg Intravenous Every 24 hours 01/08/17 1257 01/08/17    01/08/17 0000  daptomycin (CUBICIN) IVPB     800 mg Intravenous Every 24 hours 01/08/17 1258 02/12/17 2359   01/07/17 1203  vancomycin (VANCOCIN) powder  Status:  Discontinued       As needed 01/07/17 1221 01/07/17 1304   01/07/17 1115  polymyxin B 500,000 Units, bacitracin 50,000 Units in sodium chloride irrigation 0.9 % 500 mL irrigation  Status:  Discontinued       As needed 01/07/17 1116 01/07/17 1304   01/03/17 1000  rifampin (RIFADIN) capsule 300 mg     300 mg Oral Daily 01/03/17 0833     01/01/17 2100  vancomycin (VANCOCIN) 1,250 mg in sodium chloride 0.9 % 250 mL IVPB  Status:  Discontinued     1,250 mg 166.7 mL/hr over 90 Minutes Intravenous Every 24 hours 12/31/16 2020 01/01/17 1542   01/01/17 1700  DAPTOmycin (CUBICIN) 800 mg in sodium chloride 0.9 % IVPB     800 mg 232 mL/hr over 30 Minutes Intravenous Every 24 hours 01/01/17 1605     01/01/17 0600  ciprofloxacin (CIPRO) IVPB 400 mg  Status:  Discontinued     400 mg 200 mL/hr over 60 Minutes Intravenous Every 12 hours 12/31/16 1806 01/01/17 1542   12/31/16 1700  metroNIDAZOLE (FLAGYL) IVPB 500 mg  Status:  Discontinued     500 mg 100 mL/hr over 60 Minutes Intravenous Every 8 hours 12/31/16 1551 01/01/17 1542   12/31/16 1600  ciprofloxacin (CIPRO) IVPB 400 mg     400 mg 200 mL/hr over 60 Minutes Intravenous STAT 12/31/16 1558 12/31/16 1901   12/31/16 1600  vancomycin (VANCOCIN) 2,000 mg in sodium chloride 0.9 % 500 mL IVPB     2,000 mg 250 mL/hr over 120 Minutes  Intravenous STAT 12/31/16 1559 01/01/17 0004   12/31/16 1415  vancomycin (VANCOCIN) 1,000 mg in sodium chloride 0.9 % 250 mL IVPB  Status:  Discontinued     1,000 mg 250 mL/hr over 60 Minutes Intravenous  Once 12/31/16 1407 12/31/16 1408   12/31/16 1408  vancomycin (VANCOCIN) IVPB 1000 mg/200 mL premix  Status:  Discontinued     1,000 mg 200 mL/hr over 60 Minutes Intravenous 60 min pre-op 12/31/16 1408 12/31/16 1536   12/31/16 1408  vancomycin (VANCOCIN) 1-5 GM/200ML-% IVPB  Status:  Discontinued    Comments:  Harvell, Gwendolyn  : cabinet override      12/31/16 1408 12/31/16 1558      Medications:  Scheduled: . amLODipine  2.5 mg Oral Daily  . aspirin EC  325 mg Oral BID  . celecoxib  200 mg Oral Q12H  . DAPTOmycin (CUBICIN)  IV  800 mg Intravenous Q24H  . docusate sodium  100 mg Oral BID  . ferrous sulfate  325 mg Oral TID PC  . multivitamin with minerals  1 tablet Oral Daily  . mupirocin ointment   Nasal BID  . polyethylene glycol  17 g Oral BID  . protein supplement shake  11 oz Oral BID BM  . rifampin  300 mg Oral Daily  . senna  1 tablet Oral BID  . Vitamin D (Ergocalciferol)  50,000 Units Oral Q Sat    Objective: Vital signs in last 24 hours: Temp:  [97.6 F (36.4 C)-99.2 F (37.3 C)] 99.2 F (37.3 C) (03/28 1415) Pulse Rate:  [72-88] 74 (03/28 1455) Resp:  [16-26] 22 (03/28 1455) BP: (139-201)/(58-92) 176/65 (03/28 1455) SpO2:  [98 %-100 %] 99 % (03/28 1455)   General appearance: alert, cooperative and no distress Resp: clear to auscultation bilaterally Cardio: regular rate and rhythm GI: normal findings: bowel sounds normal and soft, non-tender Extremities: R hip wound is dressed, clean, nontender. RUE PIC is clean.   Lab Results  Recent Labs  01/06/17 2351 01/07/17 0419 01/08/17 0618  WBC  --  10.2 8.8  HGB  --  9.6* 9.4*  HCT  --  28.1* 27.0*  NA 136  --  133*  K 3.3*  --  3.6  CL 106  --  102  CO2 24  --  24  BUN 17  --  13  CREATININE 0.80   --  0.73   Liver Panel  Recent Labs  01/06/17 1615  PROT 5.8*  ALBUMIN 2.3*  AST 24  ALT <5*  ALKPHOS 113  BILITOT 0.8  BILIDIR 0.3  IBILI 0.5   Sedimentation Rate No results for input(s): ESRSEDRATE in the last 72 hours. C-Reactive Protein No results for input(s): CRP in the last 72 hours.  Microbiology: Recent Results (from the past 240 hour(s))  Culture, blood (routine x 2)     Status: Abnormal   Collection Time: 12/31/16  5:35 PM  Result Value Ref Range Status   Specimen Description BLOOD BLOOD RIGHT ARM  Final   Special Requests BOTTLES DRAWN AEROBIC AND ANAEROBIC 7 CC EA  Final   Culture  Setup Time   Final    GRAM POSITIVE COCCI IN CLUSTERS IN BOTH AEROBIC AND ANAEROBIC BOTTLES CRITICAL RESULT CALLED TO, READ BACK BY AND VERIFIED WITH: EBurman Foster.D. 12:25 01/01/17 ([email or web address]) Performed at [facility], [place] [address] [place], [place] [address]    Culture STAPHYLOCOCCUS AUREUS (A)  Final   Report Status 01/03/2017 FINAL  Final   Organism ID, Bacteria STAPHYLOCOCCUS AUREUS  Final      Susceptibility   Staphylococcus aureus - MIC*    CIPROFLOXACIN <=0.5 SENSITIVE Sensitive     ERYTHROMYCIN <=0.25 SENSITIVE Sensitive     GENTAMICIN <=0.5 SENSITIVE Sensitive     OXACILLIN 0.5 SENSITIVE Sensitive     TETRACYCLINE <=1 SENSITIVE Sensitive     VANCOMYCIN 1 SENSITIVE Sensitive     TRIMETH/SULFA <=10 SENSITIVE Sensitive     CLINDAMYCIN <=0.25 SENSITIVE Sensitive     RIFAMPIN <=0.5 SENSITIVE Sensitive     Inducible Clindamycin NEGATIVE Sensitive     * STAPHYLOCOCCUS AUREUS  Blood Culture ID Panel (Reflexed)     Status: Abnormal   Collection Time: 12/31/16  5:35 PM  Result Value Ref Range Status   Enterococcus species NOT DETECTED NOT DETECTED Final   Listeria monocytogenes NOT DETECTED NOT DETECTED Final   Staphylococcus species DETECTED (A) NOT DETECTED Final    Comment: CRITICAL RESULT CALLED TO, READ BACK BY AND VERIFIED WITH: EBurman Foster.D. 12:25  01/01/17 ([email or web address])    Staphylococcus aureus DETECTED (A) NOT DETECTED Final    Comment: Methicillin (oxacillin) susceptible Staphylococcus aureus (MSSA). Preferred therapy is anti staphylococcal beta lactam antibiotic (Cefazolin  or Nafcillin), unless clinically contraindicated. CRITICAL RESULT CALLED TO, READ BACK BY AND VERIFIED WITH: EBurman Foster.D. 12:25 01/01/17 ([email or web address])    Methicillin resistance NOT DETECTED NOT DETECTED Final   Streptococcus species NOT DETECTED NOT DETECTED Final   Streptococcus agalactiae NOT DETECTED NOT DETECTED Final   Streptococcus pneumoniae NOT DETECTED NOT DETECTED Final   Streptococcus pyogenes NOT DETECTED NOT DETECTED Final   Acinetobacter baumannii NOT DETECTED NOT DETECTED Final   Enterobacteriaceae species NOT DETECTED NOT DETECTED Final   Enterobacter cloacae complex NOT DETECTED NOT DETECTED Final   Escherichia coli NOT DETECTED NOT DETECTED Final   Klebsiella oxytoca NOT DETECTED NOT DETECTED Final   Klebsiella pneumoniae NOT DETECTED NOT DETECTED Final   Proteus species NOT DETECTED NOT DETECTED Final   Serratia marcescens NOT DETECTED NOT DETECTED Final   Haemophilus influenzae NOT DETECTED NOT DETECTED Final   Neisseria meningitidis NOT DETECTED NOT DETECTED Final   Pseudomonas aeruginosa NOT DETECTED NOT DETECTED Final   Candida albicans NOT DETECTED NOT DETECTED Final   Candida glabrata NOT DETECTED NOT DETECTED Final   Candida krusei NOT DETECTED NOT DETECTED Final   Candida parapsilosis NOT DETECTED NOT DETECTED Final   Candida tropicalis NOT DETECTED NOT DETECTED Final    Comment: Performed at [facility], [address], [place], [place] [address]  Culture, blood (routine x 2)     Status: Abnormal   Collection Time: 12/31/16  5:45 PM  Result Value Ref Range Status   Specimen Description BLOOD BLOOD RIGHT ARM  Final   Special Requests BOTTLES DRAWN AEROBIC AND ANAEROBIC 5.5 CC EA  Final   Culture  Setup Time   Final    GRAM  POSITIVE COCCI IN BOTH AEROBIC AND ANAEROBIC BOTTLES CRITICAL VALUE NOTED.  VALUE IS CONSISTENT WITH PREVIOUSLY REPORTED AND CALLED VALUE.    Culture (A)  Final    STAPHYLOCOCCUS AUREUS SUSCEPTIBILITIES PERFORMED ON PREVIOUS CULTURE WITHIN THE LAST 5 DAYS. Performed at [facility], [place] [address] [place], [place] [address]    Report Status 01/03/2017 FINAL  Final  Surgical PCR screen     Status: Abnormal   Collection Time: 01/01/17  2:11 PM  Result Value Ref Range Status   MRSA, PCR NEGATIVE NEGATIVE Final   Staphylococcus aureus POSITIVE (A) NEGATIVE Final    Comment:        The Xpert SA Assay (FDA approved for NASAL specimens in patients over 73 years of age), is one component of a comprehensive surveillance program.  Test performance has been validated by [facility] for patients greater than or equal to 51 year old. It is not intended to diagnose infection nor to guide or monitor treatment.   Aerobic/Anaerobic Culture (surgical/deep wound)     Status: None   Collection Time: 01/01/17  6:51 PM  Result Value Ref Range Status   Specimen Description SYNOVIAL RIGHT HIP  Final   Special Requests FLUID ON SWAB  Final   Gram Stain   Final    ABUNDANT WBC PRESENT,BOTH PMN AND MONONUCLEAR FEW GRAM POSITIVE COCCI IN PAIRS    Culture   Final    MODERATE STAPHYLOCOCCUS AUREUS NO ANAEROBES ISOLATED Performed at [facility], [place] [address] [place], [place] [address]    Report Status 01/06/2017 FINAL  Final   Organism ID, Bacteria STAPHYLOCOCCUS AUREUS  Final      Susceptibility   Staphylococcus aureus - MIC*    CIPROFLOXACIN <=0.5 SENSITIVE Sensitive  ERYTHROMYCIN <=0.25 SENSITIVE Sensitive     GENTAMICIN <=0.5 SENSITIVE Sensitive     OXACILLIN 0.5 SENSITIVE Sensitive     TETRACYCLINE <=1 SENSITIVE Sensitive     VANCOMYCIN <=0.5 SENSITIVE Sensitive     TRIMETH/SULFA <=10 SENSITIVE Sensitive     CLINDAMYCIN <=0.25 SENSITIVE Sensitive     RIFAMPIN <=0.5  SENSITIVE Sensitive     Inducible Clindamycin NEGATIVE Sensitive     * MODERATE STAPHYLOCOCCUS AUREUS  MRSA PCR Screening     Status: None   Collection Time: 01/01/17 10:16 PM  Result Value Ref Range Status   MRSA by PCR NEGATIVE NEGATIVE Final    Comment:        The GeneXpert MRSA Assay (FDA approved for NASAL specimens only), is one component of a comprehensive MRSA colonization surveillance program. It is not intended to diagnose MRSA infection nor to guide or monitor treatment for MRSA infections.   Culture, blood (Routine X 2) w Reflex to ID Panel     Status: None   Collection Time: 01/02/17  8:13 AM  Result Value Ref Range Status   Specimen Description BLOOD LEFT ARM  Final   Special Requests BOTTLES DRAWN AEROBIC AND ANAEROBIC 10CC  Final   Culture   Final    NO GROWTH 5 DAYS Performed at [facility], [place] [address] [place], [place] [address]    Report Status 01/07/2017 FINAL  Final    Studies/Results: No results found.   Assessment/Plan: MSSA bacteremia Continue dapto, rifampin Repeat BCx 3-22 negative TTE (-). TEE (-)  PIC placed Plan for 6 weeks of anbx  R THR infection, osteomyelitis Poly-exchange, placement of beads 3-21 Rifampin added 3-23. LFTs normal (3-26) ESR 118, CRP 29.7 (3-21) Repeat wash out 3-27  HTN Prev hypotensive, now hypertension returned.  Per primary team  Total days of antibiotics: 8 daptomycin (3-21) Rifampin (3-23)  Allergies  Allergen Reactions  . Cephalexin Swelling    Facial swelling after po kelfex.   . Ace Inhibitors Rash  . Doxycycline Rash    Discharge antibiotics: Per pharmacy protocol: daptomycin 853m q24h  Rifampin 3034mqday  Duration: 6 weeks End Date: 02-12-17  [facility]  Labs weekly while on IV antibiotics: _x_ CBC with differential __ BMP _x_ CMP _x_ CRP _x_ ESR _x_ CK  _x_ Please pull PIC at completion of IV antibiotics __ Please leave PIC in place until  doctor has seen patient or been notified  Fax weekly labs to ([phone] Follow Up Appt: Dov Dill 6 weeks          JeBobby Rumpfnfectious Diseases (pager) [phone] 01/08/2017, 3:51 PM  LOS: 8 days

## 2017-01-08 NOTE — Anesthesia Preprocedure Evaluation (Addendum)
Anesthesia Evaluation  Patient identified by MRN, date of birth, ID band Patient awake    Reviewed: Allergy & Precautions, NPO status , Patient's Chart, lab work & pertinent test results  History of Anesthesia Complications (+) PONV  Airway Mallampati: I  TM Distance: >3 FB Neck ROM: Full    Dental  (+) Teeth Intact, Dental Advisory Given   Pulmonary former smoker,    breath sounds clear to auscultation       Cardiovascular hypertension, Pt. on medications  Rhythm:Regular Rate:Normal     Neuro/Psych    GI/Hepatic   Endo/Other    Renal/GU      Musculoskeletal   Abdominal   Peds  Hematology   Anesthesia Other Findings s/p UPPP  Reproductive/Obstetrics                            Anesthesia Physical Anesthesia Plan  ASA: III  Anesthesia Plan: MAC   Post-op Pain Management:    Induction: Intravenous  Airway Management Planned: Nasal Cannula  Additional Equipment: None  Intra-op Plan:   Post-operative Plan: Extubation in OR  Informed Consent: I have reviewed the patients History and Physical, chart, labs and discussed the procedure including the risks, benefits and alternatives for the proposed anesthesia with the patient or authorized representative who has indicated his/her understanding and acceptance.     Plan Discussed with: CRNA, Anesthesiologist and Surgeon  Anesthesia Plan Comments:         Anesthesia Quick Evaluation

## 2017-01-08 NOTE — Progress Notes (Addendum)
   01/08/17 1200  PT Visit Information  Last PT Received On 01/08/17  Assistance Needed +1  History of Present Illness 56 year old man s/p R hip I & D and liner exchange.  3/27 s/p debridement and placement of antibiotic beads  Subjective Data  Subjective pt going to  for TEE today, wants something to drink but knows he can't have it yet  Patient Stated Goal sit on my back deck and drink a beer  Precautions  Precautions Fall;Posterior Hip  Precaution Comments pt recalls 2/3 THP today, reviewed all THP with regard to functional mobility  Restrictions  Weight Bearing Restrictions No  Other Position/Activity Restrictions WBAT  Pain Assessment  Pain Assessment Faces  Faces Pain Scale 4  Pain Location R hip  Pain Descriptors / Indicators Sore  Pain Intervention(s) Limited activity within patient's tolerance;Monitored during session;Repositioned;Ice applied  Cognition  Arousal/Alertness Awake/alert  Behavior During Therapy WFL for tasks assessed/performed  Overall Cognitive Status Within Functional Limits for tasks assessed  Bed Mobility  General bed mobility comments (OOB in chair)  Transfers  Overall transfer level Needs assistance  Equipment used Rolling walker (2 wheeled)  Transfers Sit to/from Stand  Sit to Stand Min assist;Min guard  General transfer comment steadying assistance and cues for UE/LE placement  Ambulation/Gait  Ambulation/Gait assistance Min guard;Min assist  Ambulation Distance (Feet) 80 Feet  Assistive device Rolling walker (2 wheeled)  Gait Pattern/deviations Step-to pattern;Decreased step length - right;Decreased stance time - right;Antalgic  General Gait Details cues for sequence, RW distance from self, step length  General Comments  General comments (skin integrity, edema, etc.) RLE edematous, pt inquiring about TED hose, nsg staff made aware  Total Joint Exercises  Ankle Circles/Pumps AROM;Both;10 reps  PT - End of Session  Activity Tolerance Patient  tolerated treatment well  Patient left in chair;with call bell/phone within reach;with chair alarm set  Nurse Communication Mobility status  PT - Assessment/Plan  PT Plan Current plan remains appropriate  PT Visit Diagnosis Unsteadiness on feet (R26.81)  Pain - Right/Left Right  Pain - part of body Hip  PT Frequency (ACUTE ONLY) 7X/week  Follow Up Recommendations Home health PT;Supervision for mobility/OOB  PT equipment None recommended by PT  AM-PAC PT "6 Clicks" Daily Activity Outcome Measure  Difficulty turning over in bed (including adjusting bedclothes, sheets and blankets)? 3  Difficulty moving from lying on back to sitting on the side of the bed?  3  Difficulty sitting down on and standing up from a chair with arms (e.g., wheelchair, bedside commode, etc,.)? 3  Help needed moving to and from a bed to chair (including a wheelchair)? 3  Help needed walking in hospital room? 3  Help needed climbing 3-5 steps with a railing?  3  6 Click Score 18  Mobility G Code  CK  PT Goal Progression  Progress towards PT goals Progressing toward goals  Acute Rehab PT Goals  PT Goal Formulation With patient  Time For Goal Achievement 01/16/17  Potential to Achieve Goals Good  PT Time Calculation  PT Start Time (ACUTE ONLY) 1105  PT Stop Time (ACUTE ONLY) 1124  PT Time Calculation (min) (ACUTE ONLY) 19 min  PT General Charges  $$ ACUTE PT VISIT 1 Procedure  PT Treatments  $Gait Training 8-22 mins   Kenyon Ana, [place] Pager: [phone] 01/08/2017

## 2017-01-08 NOTE — Progress Notes (Signed)
PHARMACY CONSULT NOTE FOR:  OUTPATIENT  PARENTERAL ANTIBIOTIC THERAPY (OPAT)  Indication: MSSA right THR infection and bacteremia Regimen: Daptomycin 800 mg IV q24h End date: 02/12/2017  IV antibiotic discharge orders are pended. To discharging provider:  please sign these orders via discharge navigator,  Select New Orders & click on the button choice - Manage This Unsigned Work.     Thank you for allowing pharmacy to be a part of this patient's care.  Lynelle Doctor 01/08/2017, 12:04 PM

## 2017-01-08 NOTE — Progress Notes (Signed)
Echocardiogram Echocardiogram Transesophageal has been performed.  [name] 01/08/2017, 2:18 PM

## 2017-01-08 NOTE — Transfer of Care (Signed)
Immediate Anesthesia Transfer of Care Note  Patient: [name]  Procedure(s) Performed: Procedure(s): TRANSESOPHAGEAL ECHOCARDIOGRAM (TEE) (N/A)  Patient Location: Endoscopy Unit  Anesthesia Type:General  Level of Consciousness: awake, oriented and patient cooperative  Airway & Oxygen Therapy: Patient Spontanous Breathing and Patient connected to nasal cannula oxygen  Post-op Assessment: Report given to RN, Post -op Vital signs reviewed and stable and Patient moving all extremities  Post vital signs: Reviewed and stable  Last Vitals:  Vitals:   01/08/17 1304 01/08/17 1415  BP: (!) 201/80 (!) 146/64  Pulse: 76 88  Resp: 16 (!) 25  Temp: 36.9 C 37.3 C    Last Pain:  Vitals:   01/08/17 1415  TempSrc: Oral  PainSc:       Patients Stated Pain Goal: 2 (64/38/38 1840)  Complications: No apparent anesthesia complications

## 2017-01-09 LAB — BASIC METABOLIC PANEL
ANION GAP: 8 (ref 5–15)
BUN: 10 mg/dL (ref 6–20)
CHLORIDE: 99 mmol/L — AB (ref 101–111)
CO2: 27 mmol/L (ref 22–32)
Calcium: 8.6 mg/dL — ABNORMAL LOW (ref 8.9–10.3)
Creatinine, Ser: 0.66 mg/dL (ref 0.61–1.24)
GFR calc non Af Amer: >60 mL/min (ref >60)
Glucose, Bld: 121 mg/dL — ABNORMAL HIGH (ref 65–99)
Potassium: 3.4 mmol/L — ABNORMAL LOW (ref 3.5–5.1)
Sodium: 134 mmol/L — ABNORMAL LOW (ref 135–145)

## 2017-01-09 LAB — CBC
HEMATOCRIT: 26.8 % — AB (ref 39.0–52.0)
Hemoglobin: 9.3 g/dL — ABNORMAL LOW (ref 13.0–17.0)
MCH: 32.6 pg (ref 26.0–34.0)
MCHC: 34.7 g/dL (ref 30.0–36.0)
MCV: 94 fL (ref 78.0–100.0)
Platelets: 456 10*3/uL — ABNORMAL HIGH (ref 150–400)
RBC: 2.85 MIL/uL — ABNORMAL LOW (ref 4.22–5.81)
RDW: 14.5 % (ref 11.5–15.5)
WBC: 8.3 10*3/uL (ref 4.0–10.5)

## 2017-01-09 MED ORDER — ASPIRIN 325 MG PO TBEC: 325.0000 mg | DELAYED_RELEASE_TABLET | Freq: Two times a day (BID) | ORAL | 0 refills | Status: AC

## 2017-01-09 MED ORDER — RIFAMPIN 300 MG PO CAPS: 300.0000 mg | ORAL_CAPSULE | Freq: Every day | ORAL | 0 refills | Status: DC

## 2017-01-09 MED ORDER — POLYETHYLENE GLYCOL 3350 17 G PO PACK: 17.0000 g | PACK | Freq: Two times a day (BID) | ORAL | 0 refills | Status: DC

## 2017-01-09 MED ORDER — HYDROCODONE-ACETAMINOPHEN 5-325 MG PO TABS: 1.0000 | ORAL_TABLET | ORAL | 0 refills | Status: DC | PRN

## 2017-01-09 MED ORDER — DOCUSATE SODIUM 100 MG PO CAPS: 100.0000 mg | ORAL_CAPSULE | Freq: Two times a day (BID) | ORAL | 0 refills | Status: DC

## 2017-01-09 MED ORDER — HEPARIN SOD (PORK) LOCK FLUSH 100 UNIT/ML IV SOLN
250.0000 [IU] | INTRAVENOUS | Status: AC | PRN
  Administered 2017-01-09: 250 [IU]

## 2017-01-09 MED ORDER — FERROUS SULFATE 325 (65 FE) MG PO TABS: 325.0000 mg | ORAL_TABLET | Freq: Three times a day (TID) | ORAL | 3 refills | Status: DC

## 2017-01-09 MED ORDER — METHOCARBAMOL 500 MG PO TABS: 500.0000 mg | ORAL_TABLET | Freq: Four times a day (QID) | ORAL | 0 refills | Status: DC | PRN

## 2017-01-09 NOTE — Progress Notes (Signed)
     Subjective: 8 Days Post-Op Procedure(s) (LRB): I&D with poly exchange of right hip 2 Days Post-Op Procedure Repeat I&D of right hip  Patient reports pain as mild, pain controlled.  No events throughout the night.  Patient pleased with the results of his tests.  Drain pulled with no additional drainage, discussed the dressing placed on the leg. Ready to be discharged home.   Objective:   VITALS:   Vitals:   01/09/17 0509 01/09/17 0655  BP: (!) 173/59 (!) 164/66  Pulse: 74 65  Resp: 18   Temp: 98.5 F (36.9 C)     Dorsiflexion/Plantar flexion intact Incision: dressing C/D/I  LABS  Recent Labs  01/07/17 0419 01/08/17 0618 01/09/17 0520  HGB 9.6* 9.4* 9.3*  HCT 28.1* 27.0* 26.8*  WBC 10.2 8.8 8.3  PLT 374 412* 456*     Recent Labs  01/06/17 2351 01/08/17 0618 01/09/17 0520  NA 136 133* 134*  K 3.3* 3.6 3.4*  BUN 17 13 10   CREATININE 0.80 0.73 0.66  GLUCOSE 153* 128* 121*     Assessment/Plan: 8 Days Post-Op Procedure(s) (LRB): I&D with poly exchange of right hip 2 Days Post-Op Procedure Repeat I&D of right hip   HV drain pulled and dressing placed Nurse to change dressing to Aquacel dressing prior to pt d/c. Up with therapy Discharge home with home health   [place] Pugh. Elver Stadler   PAC  01/09/2017, 8:05 AM

## 2017-01-09 NOTE — Progress Notes (Signed)
RN reviewed discharge instructions with patient and family. All questions answered.   Paperwork and prescriptions given.   NT rolled patient down with all belongings to family car. 

## 2017-01-09 NOTE — Care Management Note (Addendum)
Case Management Note  Patient Details  Name: Julie Cuaresma MRN: [id number] Date of Birth: 04/29/1961  Subjective/Objective:  55 y.o. M admitted 12/31/2016 s/p R Hip I and D Liner Exchange with placement of abx beads 01/01/2017. Repeat washout 3/27. Infectious Dz has made recommendations for 6 weeks abx therapy which will be coordinated through [facility] and [employer]. Pam Chandler, Infusion Specialist with [facility] has been in communication with WC to facilitate this process.    Action/Plan:  Anticipate discharge home today with HHRN for Medication Management. Will need CBC, BMP and CPK weekly. ESR and CRP QOWeek.  No further CM needs but will be available should additional discharge needs arise.   Expected Discharge Date:  01/09/17               Expected Discharge Plan:  Home w Home Health Services  In-House Referral:  NA  Discharge planning Services  CM Consult  Post Acute Care Choice:  Home Health Choice offered to:  NA, Patient  DME Arranged:  N/A DME Agency:  NA  HH Arranged:  PT HH Agency:  [facility] (via [employer] )  Status of Service:  Completed, signed off  If discussed at Long Length of Stay Meetings, dates discussed:  01/09/2017  Additional Comments:  Crutchfield, Genese M, RN 01/09/2017, 10:13 AM  

## 2017-01-09 NOTE — Progress Notes (Signed)
Pharmacy Antibiotic Note  [name] is a 56 y.o. male admitted on 12/31/2016 with MSSA R THR infection, osteomyelitis, bacteremia.  TTE and TEE negative for vegetation.  S/p I&D and poly exchange, placement of beads 3/21.  Repeat  I&D with placement of vancomycin beads on 3/27. PICC placed on 3/27 with plan to treat with daptomycin and rifampin for 6 weeks (end date 02/12/17).  Today, 01/09/2017: - day #8/42 of daptomycin, rifampin day #7/42 - afeb, scr stable (crcl~100) - CK 33 on 3/25  -  repeat  I&D with placement of vancomycin beads on 3/27  Plan: - Continue daptomycin 800 mg IV q24h.   -  weekly CK  ______________________________________  Height: 5\' 7"  (170.2 cm) Weight: 250 lb (113.4 kg) IBW/kg (Calculated) : 66.1  Temp (24hrs), Avg:98.6 F (37 C), Min:98.3 F (36.8 C), Max:99.2 F (37.3 C)   Recent Labs Lab 01/03/17 0329 01/05/17 0551 01/06/17 2351 01/07/17 0419 01/08/17 0618 01/09/17 0520  WBC 15.1*  --   --  10.2 8.8 8.3  CREATININE 1.06 0.88 0.80  --  0.73 0.66    Estimated Creatinine Clearance: 122.5 mL/min (by C-G formula based on SCr of 0.66 mg/dL).    Allergies  Allergen Reactions  . Cephalexin Swelling    Facial swelling after po kelfex.   . Ace Inhibitors Rash  . Doxycycline Rash    Antimicrobials this admission:  3/20 vanc >>  3/21 3/20 cipro >>  3/21 3/20 flagyl >>3/21 3/21 dapto>> 3/23 rifampin >>  Dose adjustments this admission:  --  Microbiology results:  3/20 BCx: 2/2 (both bottles) MSSA FINAL 3/21 hip aspirate: MSSA 3/22 BCx: NGTD 3/22: HIV nonreactive Thank you for allowing pharmacy to be a part of this patient's care.  Lynelle Doctor 01/09/2017 9:56 AM

## 2017-01-09 NOTE — Progress Notes (Signed)
Occupational Therapy Treatment Patient Details Name: [name] MRN: [id number] DOB: Mar 29, 1961 Today's Date: 01/09/2017    History of present illness 56 year old man s/p R hip I & D and liner exchange.  3/27 s/p debridement and placement of antibiotic beads   OT comments  Pt continues to need min cues for THPs.  Bathed, used toilet and showed tub bench this session  Follow Up Recommendations  Supervision/Assistance - 24 hour    Equipment Recommendations  None recommended by OT    Recommendations for Other Services      Precautions / Restrictions Precautions Precautions: Fall;Posterior Hip Precaution Comments: pt recalls 2/3 THP today, reviewed all THP with regard to functional mobility Restrictions RUE Weight Bearing: Weight bearing as tolerated       Mobility Bed Mobility         Supine to sit: Min assist Sit to supine: Mod assist   General bed mobility comments: assist for RLE OOB and both for back to bed  Transfers   Equipment used: Rolling walker (2 wheeled)   Sit to Stand: Min guard         General transfer comment: for safety; cues for UE/LE placement    Balance                                           ADL either performed or assessed with clinical judgement   ADL       Grooming: Wash/dry hands;Supervision/safety;Standing       Lower Body Bathing: Moderate assistance;Sit to/from stand (without AE)           Toilet Transfer: Min guard;Ambulation;BSC;RW   Toileting- Clothing Manipulation and Hygiene: Minimal assistance;Sit to/from stand         General ADL Comments: showed pt tub transfer bench; and demonstrated getting onto this to access tub. Pt states he is planning to convert tub to shower in several months:  gave him this as an option.  He has a PICC and must cover it, if OK with MD     Vision       Perception     Praxis      Cognition Arousal/Alertness: Awake/alert Behavior During Therapy: WFL for  tasks assessed/performed Overall Cognitive Status: Within Functional Limits for tasks assessed                                          Exercises     Shoulder Instructions       General Comments      Pertinent Vitals/ Pain       Pain Assessment: Faces Faces Pain Scale: Hurts a little bit Pain Location: R hip Pain Descriptors / Indicators: Sore Pain Intervention(s): Limited activity within patient's tolerance;Monitored during session;Premedicated before session;Repositioned;Ice applied  Home Living                                          Prior Functioning/Environment              Frequency           Progress Toward Goals  OT Goals(current goals can now be found in the care plan section)  Progress towards  OT goals: Progressing toward goals     Plan      Co-evaluation                 End of Session        Activity Tolerance Patient tolerated treatment well   Patient Left in bed;with call bell/phone within reach   Nurse Communication          Time: 3202-3343 OT Time Calculation (min): 27 min  Charges: OT General Charges $OT Visit: 1 Procedure OT Treatments $Self Care/Home Management : 23-37 mins  Lesle Chris, OTR/L [phone] 01/09/2017   [place] 01/09/2017, 9:21 AM

## 2017-01-09 NOTE — Progress Notes (Signed)
PT Cancellation Note  Patient Details Name: [name] MRN: [id number] DOB: 22-Sep-1961   Cancelled Treatment:    Reason Eval/Treat Not Completed: Other (comment) (pt. declines PT. Ready for DC.)   Marcelino Freestone PT [phone]  01/09/2017, 12:33 PM

## 2017-01-13 NOTE — Discharge Summary (Signed)
Physician Discharge Summary  Patient ID: [name] MRN: [id number] DOB/AGE: 17-Apr-1961 56 y.o.  Admit date: 12/31/2016 Discharge date: 01/09/2017   Procedures:  Orthopaedic Procedures: 1. I&D of right THA with poly exchange 2. Repeat I&D of right THA  Attending Physician:  Dr. Paralee Cancel   Admission Diagnoses:   Right hip acute infection  Discharge Diagnoses:  Active Problems:   Essential hypertension   Acute hyponatremia   Acute renal disease   Prosthetic hip infection (HCC)   S/P hip replacement  Past Medical History:  Diagnosis Date  . Arthritis   . History of blood transfusion   . Hypertension   . Pneumonia    hx of  . PONV (postoperative nausea and vomiting)     HPI:    Pt is a 56 y.o. male complaining of right hip pain. Pain had continually increased since the beginning. Pain started on 12/27/2016 early in the morning times 2. He was having a lot of pain with movement of the hip and difficulty with bearing weight.  X-rays in the clinic show previous right THA revision. Pt has tried various conservative treatments which have failed to alleviate their symptoms. Various options are discussed with the patient. Risks, benefits and expectations were discussed with the patient. Patient understand the risks, benefits and expectations and wishes to proceed with surgery.   PCP: No PCP Per Patient   Discharged Condition: good  Hospital Course:  Anesthesia was concerned with the patient's labs values and cancelled the case.  The patient was admitted to the hospital on 12/31/2016 and medicine and ID were consulted.  During the night the patient was tuned up a little and the lab values improved.  The patient underwent the first I&D of the right hip on 01/01/2017. Patient tolerated the procedure well and brought to the recovery room in good condition and subsequently to the floor.  Patient was again followed by ID and treated with antibiotics.  The right hip was becoming tight and  swollen again.  Dr. Alvan Dame discussed with the patient and it was decided to bring him back to the OR for a repeat I&D of the right hip.  Patient tolerated the procedure well and brought to the recovery room in good condition and subsequently to the floor.  Patient had an uneventful course for the remaining stay at the hospital.  Plan is the the patient to be on IV antibiotics for at least 6 weeks, will be followed by ID.    Discharge Exam: General appearance: alert, cooperative and no distress Extremities: Homans sign is negative, no sign of DVT and no ulcers, gangrene or trophic changes  Disposition: Home with follow up in 2 weeks   Follow-up Information    [name], MD. Schedule an appointment as soon as possible for a visit in 2 week(s).   Specialty:  Orthopedic Surgery Contact information: [address] [place] [address] [phone]           Discharge Instructions    Call MD / Call 911    Complete by:  As directed    If you experience chest pain or shortness of breath, CALL 911 and be transported to the hospital emergency room.  If you develope a fever above 101 F, pus (white drainage) or increased drainage or redness at the wound, or calf pain, call your surgeon's office.   Change dressing    Complete by:  As directed    Maintain surgical dressing until follow up  in the clinic. If the edges start to pull up, may reinforce with tape. If the dressing is no longer working, may remove and cover with gauze and tape, but must keep the area dry and clean.  Call with any questions or concerns.   Constipation Prevention    Complete by:  As directed    Drink plenty of fluids.  Prune juice may be helpful.  You may use a stool softener, such as Colace (over the counter) 100 mg twice a day.  Use MiraLax (over the counter) for constipation as needed.   Diet - low sodium heart healthy    Complete by:  As directed    Discharge instructions    Complete by:  As  directed    Maintain surgical dressing until follow up in the clinic. If the edges start to pull up, may reinforce with tape. If the dressing is no longer working, may remove and cover with gauze and tape, but must keep the area dry and clean.  Follow up in 2 weeks at [facility]. Call with any questions or concerns.   Follow the hip precautions as taught in Physical Therapy    Complete by:  As directed    Home infusion instructions [facility] May follow [place] Dosing Protocol; May administer Cathflo as needed to maintain patency of vascular access device.; Flushing of vascular access device: per [facility] Protocol: 0.9% NaCl pre/post medica...    Complete by:  As directed    Instructions:  May follow [place] Dosing Protocol   Instructions:  May administer Cathflo as needed to maintain patency of vascular access device.   Instructions:  Flushing of vascular access device: per [facility] Protocol: 0.9% NaCl pre/post medication administration and prn patency; Heparin 100 u/ml, 22m for implanted ports and Heparin 10u/ml, 573mfor all other central venous catheters.   Instructions:  May follow [facility] Anaphylaxis Protocol for First Dose Administration in the home: 0.9% NaCl at 25-50 ml/hr to maintain IV access for protocol meds. Epinephrine 0.3 ml IV/IM PRN and Benadryl 25-50 IV/IM PRN s/s of anaphylaxis.   Instructions:  [facility] Coordinator (RN) to assist per patient IV care needs in the home PRN.   Increase activity slowly as tolerated    Complete by:  As directed    Weight bearing as tolerated with assist device (walker, cane, etc) as directed, use it as long as suggested by your surgeon or therapist, typically at least 4-6 weeks.   TED hose    Complete by:  As directed    Use stockings (TED hose) for 2 weeks on both leg(s).  You may remove them at night for sleeping.      Allergies as of 01/09/2017      Reactions   Cephalexin Swelling   Facial swelling after po  kelfex.    Ace Inhibitors Rash   Doxycycline Rash      Medication List    TAKE these medications   ALEVE 220 MG tablet Generic drug:  naproxen sodium Take 220 mg by mouth every 8 (eight) hours as needed (For pain.).   amLODipine 2.5 MG tablet Commonly known as:  NORVASC Take 2.5 mg by mouth daily.   aspirin 325 MG EC tablet Take 1 tablet (325 mg total) by mouth 2 (two) times daily. Take for 4 weeks.   carvedilol 12.5 MG tablet Commonly known as:  COREG Take 12.5 mg by mouth 2 (two) times daily.   celecoxib 200 MG capsule  Commonly known as:  CELEBREX Take 200 mg by mouth daily. with food   daptomycin IVPB Commonly known as:  CUBICIN Inject 800 mg into the vein daily. Indication:  MSSA right THR infection and bacteremia Last Day of Therapy:  02/12/2017 Labs - Once weekly:  CBC/D, BMP, and CPK Labs - Every other week:  ESR and CRP   docusate sodium 100 MG capsule Commonly known as:  COLACE Take 1 capsule (100 mg total) by mouth 2 (two) times daily. Notes to patient:  Stool softener   ferrous sulfate 325 (65 FE) MG tablet Take 1 tablet (325 mg total) by mouth 3 (three) times daily after meals.   HYDROcodone-acetaminophen 5-325 MG tablet Commonly known as:  NORCO/VICODIN Take 1-2 tablets by mouth every 4 (four) hours as needed (For pain.). What changed:  when to take this   methocarbamol 500 MG tablet Commonly known as:  ROBAXIN Take 1 tablet (500 mg total) by mouth every 6 (six) hours as needed for muscle spasms.   multivitamin with minerals Tabs tablet Take 1 tablet by mouth daily.   polyethylene glycol packet Commonly known as:  MIRALAX / GLYCOLAX Take 17 g by mouth 2 (two) times daily.   rifampin 300 MG capsule Commonly known as:  RIFADIN Take 1 capsule (300 mg total) by mouth daily.   valsartan 320 MG tablet Commonly known as:  DIOVAN Take 320 mg by mouth every morning.   Vitamin D (Ergocalciferol) 50000 units Caps capsule Commonly known as:   DRISDOL Take 50,000 Units by mouth every Saturday. Notes to patient:  Home regimen            Home Infusion Instuctions        Start     Ordered   01/08/17 0000  Home infusion instructions [facility] May follow [place] Dosing Protocol; May administer Cathflo as needed to maintain patency of vascular access device.; Flushing of vascular access device: per [facility] Protocol: 0.9% NaCl pre/post medica...    Question Answer Comment  Instructions May follow [place] Dosing Protocol   Instructions May administer Cathflo as needed to maintain patency of vascular access device.   Instructions Flushing of vascular access device: per [facility] Protocol: 0.9% NaCl pre/post medication administration and prn patency; Heparin 100 u/ml, 73m for implanted ports and Heparin 10u/ml, 531mfor all other central venous catheters.   Instructions May follow [facility] Anaphylaxis Protocol for First Dose Administration in the home: 0.9% NaCl at 25-50 ml/hr to maintain IV access for protocol meds. Epinephrine 0.3 ml IV/IM PRN and Benadryl 25-50 IV/IM PRN s/s of anaphylaxis.   Instructions [facility] Infusion Coordinator (RN) to assist per patient IV care needs in the home PRN.      01/08/17 1257       Signed: MaWest PughBabish   PA-C  01/13/2017, 9:43 AM

## 2017-01-14 ENCOUNTER — Encounter: Payer: Self-pay | Admitting: Infectious Diseases

## 2017-01-27 ENCOUNTER — Encounter: Payer: Self-pay | Admitting: Infectious Diseases

## 2017-02-04 ENCOUNTER — Ambulatory Visit (INDEPENDENT_AMBULATORY_CARE_PROVIDER_SITE_OTHER): Payer: Worker's Compensation | Admitting: Rehabilitative and Restorative Service Providers"

## 2017-02-04 ENCOUNTER — Encounter: Payer: Self-pay | Admitting: Rehabilitative and Restorative Service Providers"

## 2017-02-04 DIAGNOSIS — M6281 Muscle weakness (generalized): Secondary | ICD-10-CM | POA: Diagnosis not present

## 2017-02-04 DIAGNOSIS — M25552 Pain in left hip: Secondary | ICD-10-CM

## 2017-02-04 DIAGNOSIS — R2689 Other abnormalities of gait and mobility: Secondary | ICD-10-CM

## 2017-02-04 DIAGNOSIS — M25551 Pain in right hip: Secondary | ICD-10-CM

## 2017-02-04 NOTE — Therapy (Addendum)
[facility] [place], [place], [address] Phone: [phone]   Fax:  [phone]  Physical Therapy Evaluation  Patient Details  Name: [name] MRN: [id number] Date of Birth: [birth year] Referring Provider: Dr Adriana Mccallum  Encounter Date: 02/04/2017      PT End of Session - 02/04/17 1012    Visit Number 1   Number of Visits 12   Date for PT Re-Evaluation 03/18/17   PT Start Time 0930   PT Stop Time 1016   PT Time Calculation (min) 46 min   Activity Tolerance Patient tolerated treatment well      Past Medical History:  Diagnosis Date  . Arthritis   . History of blood transfusion   . Hypertension   . Pneumonia    hx of  . PONV (postoperative nausea and vomiting)     Past Surgical History:  Procedure Laterality Date  . ANTERIOR HIP REVISION Left 07/17/2015   Procedure: LEFT POSTERIOR  HIP REVISION;  Surgeon: Paralee Cancel, MD;  Location: [facility];  Service: Orthopedics;  Laterality: Left;  . BACK SURGERY     four back surgeries (3 fusions)  . INCISION AND DRAINAGE HIP Right 01/01/2017   Procedure: IRRIGATION AND DEBRIDEMENT RIGHT TOTAL HIP WITH HEAD AND LINER EXCHANGE AND PLACEMENT OF STIMULIN BEADS;  Surgeon: Paralee Cancel, MD;  Location: [facility];  Service: Orthopedics;  Laterality: Right;  . INCISION AND DRAINAGE HIP Right 01/07/2017   Procedure: IRRIGATION AND DEBRIDEMENT HIP;  Surgeon: Paralee Cancel, MD;  Location: [facility];  Service: Orthopedics;  Laterality: Right;  . JOINT REPLACEMENT     3 surgeries on right hip, 1 on left hip  . right knee surgery - torn maniscus    . TEE WITHOUT CARDIOVERSION N/A 01/08/2017   Procedure: TRANSESOPHAGEAL ECHOCARDIOGRAM (TEE);  Surgeon: Josue Hector, MD;  Location: [facility];  Service: Cardiovascular;  Laterality: N/A;  . TONSILLECTOMY     adnoids removed  . TOTAL HIP REVISION Right 05/27/2016   Procedure: TOTAL HIP REVISION;  Surgeon: Paralee Cancel, MD;  Location:  [facility];  Service: Orthopedics;  Laterality: Right;    There were no vitals filed for this visit.       Subjective Assessment - 02/04/17 0933    Subjective Patient reports that he had an infection in the Rt hip. He underwent I&D for Rt hip 01/01/17 and again 01/07/17. He underwent Rt THA 8/17 to replace plastic on metal THA. Prior THA 1991; 2011. Currently having pain in the Rt and Lt hips. Lt hip has been replaced 1991 and 2015.    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 30-60 min    How long can you stand comfortably? 15-20 min    How long can you walk comfortably? 10-15 min    Diagnostic tests xrays    Patient Stated Goals to get to where I can move again    Currently in Pain? Yes   Pain Score 2    Pain Location Hip   Pain Orientation Right   Pain Descriptors / Indicators Dull;Nagging   Pain Type Surgical pain;Chronic pain   Pain Radiating Towards into groin    Pain Onset More than a month ago   Pain Frequency Intermittent   Aggravating Factors  any kind of movement of hips/legs   Pain Relieving Factors not moving    Multiple Pain Sites Yes   Pain Score 2  Pain Location Hip   Pain Orientation Left   Pain Descriptors / Indicators Dull;Sharp   Pain Type Acute pain  from hospital d/c    Pain Onset 1 to 4 weeks ago   Pain Frequency Constant   Aggravating Factors  any movement especially lifting the Lt leg    Pain Relieving Factors no movement             [facility] PT Assessment - 02/04/17 0001      Assessment   Medical Diagnosis I&D Rt hip following THA    Referring Provider Dr Adriana Mccallum   Onset Date/Surgical Date 01/07/17   Hand Dominance Right   Next MD Visit 12/08/16   Prior Therapy hospital; [facility]; outpatient      Precautions   Precautions --   Precaution Comments pic line Rt UE; continued drainage Rt hip      Balance Screen   Has the patient fallen in the past 6 months No   Has the patient had a  decrease in activity level because of a fear of falling?  No   Is the patient reluctant to leave their home because of a fear of falling?  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 ROM bilat knees/ankles WFLs     Strength   Overall Strength Comments unable to assess strength bilat LE's due to pain      Palpation   Palpation comment muscular tightness anterior hips into quads bilat - no palpatioin performed through the posterior lateral hips due to bandage and drainage Rt hip and pain Lt hip      Transfers   Comments difficulty with all transfers and transitional movements - unable to lie on either side or prone; difficulty with sit to stand due to pain - using crutches and UE's for transfer      Functional Gait  Assessment   Gait assessed  --  axillary crutches; wide based gait; antalgic gait pattern                    [facility] Adult PT Treatment/Exercise - 02/04/17 0001      Neuro Re-ed    Neuro Re-ed Details  working on upright posture with extension of trunk and hips in standing     Knee/Hip Exercises: Supine   Quad Sets  Strengthening;Right;Left;10 reps  5 sec hold    Other Supine Knee/Hip Exercises glut set 5 sec x 10    Other Supine Knee/Hip Exercises ankle pumps; circles; A-Z  PT Education - 02/04/17 1003    Education provided Yes   Education Details posture and alignment; HEP    Person(s) Educated Patient   Methods Explanation;Demonstration;Tactile cues;Verbal cues;Handout   Comprehension Verbalized understanding;Returned demonstration;Verbal cues required;Tactile cues required          PT Short Term Goals - 02/04/17 1258      PT SHORT TERM GOAL #1   Title Further evaluation of LE deficits as patient is clinically cleared by MD 02/25/17    Time 3   Period Weeks   Status New                  Plan - 02/04/17 1012    Clinical Impression Statement Lashawn presents for low complexity evaluation following I&D Rt hip 01/01/17 and agian 01/07/17 for infection. He has c/o pain in both hips with limited mobility and functional activities. Evaluation is limited due to significant pain. Patient has decreased ROM; decreased functional strength; difficulty with all transfers; poor posture and alignment; amnormal and antalgic gait; inability to perform functional ADL's or work. Patient has an appointment with Dr Alvan Dame tomorrow and we will await results of appointment to schedule physical therapy.    Rehab Potential Fair   PT Frequency 2x / week   PT Duration 6 weeks   PT Treatment/Interventions Patient/family education;ADLs/Self Care Home Management;Electrical Stimulation;Iontophoresis 4mg /ml Dexamethasone;Moist Heat;Ultrasound;Dry needling;Manual techniques;Therapeutic activities;Therapeutic exercise;Neuromuscular re-education   PT Next Visit Plan Further evaluation as tolerated/indicated; establish additional goals as indicated    Consulted and Agree with Plan of Care Patient      Patient will benefit from skilled therapeutic intervention in order to improve the following  deficits and impairments:  Postural dysfunction, Improper body mechanics, Pain, Decreased range of motion, Decreased mobility, Decreased strength, Abnormal gait, Decreased activity tolerance  Visit Diagnosis: Pain in right hip - Plan: PT plan of care cert/re-cert  Pain in left hip - Plan: PT plan of care cert/re-cert  Other abnormalities of gait and mobility - Plan: PT plan of care cert/re-cert  Muscle weakness (generalized) - Plan: PT plan of care cert/re-cert     Problem List Patient Active Problem List   Diagnosis Date Noted  . 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 Acute hyponatremia 12/31/2016  . Acute renal disease 12/31/2016  . 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 Obese 07/19/2015  . Essential hypertension 11/06/2010    Chelsea Nusz Nilda Simmer PT, MPH  02/04/2017, 1:14 PM  [facility] [place], [place], [address] Phone: [phone]   Fax:  [phone]  Name: Tramel Westbrook MRN: [id number] Date of Birth: 27-Jan-1961

## 2017-02-04 NOTE — Patient Instructions (Signed)
Work on standing straight and tall hips - straight  Trunk Extension    Standing, place back of open hands on low back. Straighten spine then arch the back and move shoulders back. Hold 2-3 seconds Repeat _3-5___ times per session. Do __several__ sessions perday  Gluteal Sets    Squeeze pelvic floor and hold. Tighten bottom. Hold for _5-10__ seconds. Relax for _1-2__ seconds. Repeat _10-20__ times. Do _2-3__ times a day.  Quad Sets    Slowly tighten thigh muscles of straight, left leg while counting out loud to _5-10___. Relax. Repeat _10-20___ times. Do ___several_ sessions per day.   Ankle pumps; circles; A-Z with toes

## 2017-02-06 NOTE — Patient Instructions (Addendum)
[name]  02/06/2017   Your procedure is scheduled on: 02/10/2017    Report to [facility] Main  Entrance and report to admitting at 0715am .    Call this number if you have problems the morning of surgery  [phone]   Remember: ONLY 1 PERSON MAY GO WITH YOU TO SHORT STAY TO GET  READY MORNING OF [place].  Do not eat food or drink liquids :After Midnight.     Take these medicines the morning of surgery with A SIP OF WATER: Amlodipine ( NORvasc), Carvedilol ( coreg), Hydrocodone if needed                                 You may not have any metal on your body including hair pins and              piercings  Do not wear jewelry,  lotions, powders or perfumes, deodorant           .              Men may shave face and neck.   Do not bring valuables to the hospital. [place].  Contacts, dentures or bridgework may not be worn into surgery.  Leave suitcase in the car. After surgery it may be brought to your room.                       Please read over the following fact sheets you were given: _____________________________________________________________________             [facility] - Preparing for Surgery Before surgery, you can play an important role.  Because skin is not sterile, your skin needs to be as free of germs as possible.  You can reduce the number of germs on your skin by washing with CHG (chlorahexidine gluconate) soap before surgery.  CHG is an antiseptic cleaner which kills germs and bonds with the skin to continue killing germs even after washing. Please DO NOT use if you have an allergy to CHG or antibacterial soaps.  If your skin becomes reddened/irritated stop using the CHG and inform your nurse when you arrive at Short Stay. Do not shave (including legs and underarms) for at least 48 hours prior to the first CHG shower.  You may shave your face/neck. Please follow these  instructions carefully:  1.  Shower with CHG Soap the night before surgery and the  morning of Surgery.  2.  If you choose to wash your hair, wash your hair first as usual with your  normal  shampoo.  3.  After you shampoo, rinse your hair and body thoroughly to remove the  shampoo.                           4.  Use CHG as you would any other liquid soap.  You can apply chg directly  to the skin and wash                       Gently with a scrungie or clean washcloth.  5.  Apply the CHG Soap to your body ONLY FROM  THE NECK DOWN.   Do not use on face/ open                           Wound or open sores. Avoid contact with eyes, ears mouth and genitals (private parts).                       Wash face,  Genitals (private parts) with your normal soap.             6.  Wash thoroughly, paying special attention to the area where your surgery  will be performed.  7.  Thoroughly rinse your body with warm water from the neck down.  8.  DO NOT shower/wash with your normal soap after using and rinsing off  the CHG Soap.                9.  Pat yourself dry with a clean towel.            10.  Wear clean pajamas.            11.  Place clean sheets on your bed the night of your first shower and do not  sleep with pets. Day of Surgery : Do not apply any lotions/deodorants the morning of surgery.  Please wear clean clothes to the hospital/surgery center.  FAILURE TO FOLLOW THESE INSTRUCTIONS MAY RESULT IN THE CANCELLATION OF YOUR SURGERY PATIENT SIGNATURE_________________________________  NURSE SIGNATURE__________________________________  ________________________________________________________________________  WHAT IS A BLOOD TRANSFUSION? Blood Transfusion Information  A transfusion is the replacement of blood or some of its parts. Blood is made up of multiple cells which provide different functions.  Red blood cells carry oxygen and are used for blood loss replacement.  White blood cells fight against  infection.  Platelets control bleeding.  Plasma helps clot blood.  Other blood products are available for specialized needs, such as hemophilia or other clotting disorders. BEFORE THE TRANSFUSION  Who gives blood for transfusions?   Healthy volunteers who are fully evaluated to make sure their blood is safe. This is blood bank blood. Transfusion therapy is the safest it has ever been in the practice of medicine. Before blood is taken from a donor, a complete history is taken to make sure that person has no history of diseases nor engages in risky social behavior (examples are intravenous drug use or sexual activity with multiple partners). The donor's travel history is screened to minimize risk of transmitting infections, such as malaria. The donated blood is tested for signs of infectious diseases, such as HIV and hepatitis. The blood is then tested to be sure it is compatible with you in order to minimize the chance of a transfusion reaction. If you or a relative donates blood, this is often done in anticipation of surgery and is not appropriate for emergency situations. It takes many days to process the donated blood. RISKS AND COMPLICATIONS Although transfusion therapy is very safe and saves many lives, the main dangers of transfusion include:   Getting an infectious disease.  Developing a transfusion reaction. This is an allergic reaction to something in the blood you were given. Every precaution is taken to prevent this. The decision to have a blood transfusion has been considered carefully by your caregiver before blood is given. Blood is not given unless the benefits outweigh the risks. AFTER THE TRANSFUSION  Right after receiving a blood transfusion, you will usually feel much better and  more energetic. This is especially true if your red blood cells have gotten low (anemic). The transfusion raises the level of the red blood cells which carry oxygen, and this usually causes an energy  increase.  The nurse administering the transfusion will monitor you carefully for complications. HOME CARE INSTRUCTIONS  No special instructions are needed after a transfusion. You may find your energy is better. Speak with your caregiver about any limitations on activity for underlying diseases you may have. SEEK MEDICAL CARE IF:   Your condition is not improving after your transfusion.  You develop redness or irritation at the intravenous (IV) site. SEEK IMMEDIATE MEDICAL CARE IF:  Any of the following symptoms occur over the next 12 hours:  Shaking chills.  You have a temperature by mouth above 102 F (38.9 C), not controlled by medicine.  Chest, back, or muscle pain.  People around you feel you are not acting correctly or are confused.  Shortness of breath or difficulty breathing.  Dizziness and fainting.  You get a rash or develop hives.  You have a decrease in urine output.  Your urine turns a dark color or changes to pink, red, or brown. Any of the following symptoms occur over the next 10 days:  You have a temperature by mouth above 102 F (38.9 C), not controlled by medicine.  Shortness of breath.  Weakness after normal activity.  The white part of the eye turns yellow (jaundice).  You have a decrease in the amount of urine or are urinating less often.  Your urine turns a dark color or changes to pink, red, or brown. Document Released: 09/27/2000 Document Revised: 12/23/2011 Document Reviewed: 05/16/2008 ExitCare Patient Information 2014 [place].  _______________________________________________________________________  Incentive Spirometer  An incentive spirometer is a tool that can help keep your lungs clear and active. This tool measures how well you are filling your lungs with each breath. Taking long deep breaths may help reverse or decrease the chance of developing breathing (pulmonary) problems (especially infection) following:  A long  period of time when you are unable to move or be active. BEFORE THE PROCEDURE   If the spirometer includes an indicator to show your best effort, your nurse or respiratory therapist will set it to a desired goal.  If possible, sit up straight or lean slightly forward. Try not to slouch.  Hold the incentive spirometer in an upright position. INSTRUCTIONS FOR USE  1. Sit on the edge of your bed if possible, or sit up as far as you can in bed or on a chair. 2. Hold the incentive spirometer in an upright position. 3. Breathe out normally. 4. Place the mouthpiece in your mouth and seal your lips tightly around it. 5. Breathe in slowly and as deeply as possible, raising the piston or the ball toward the top of the column. 6. Hold your breath for 3-5 seconds or for as long as possible. Allow the piston or ball to fall to the bottom of the column. 7. Remove the mouthpiece from your mouth and breathe out normally. 8. Rest for a few seconds and repeat Steps 1 through 7 at least 10 times every 1-2 hours when you are awake. Take your time and take a few normal breaths between deep breaths. 9. The spirometer may include an indicator to show your best effort. Use the indicator as a goal to work toward during each repetition. 10. After each set of 10 deep breaths, practice coughing to be sure your lungs  are clear. If you have an incision (the cut made at the time of surgery), support your incision when coughing by placing a pillow or rolled up towels firmly against it. Once you are able to get out of bed, walk around indoors and cough well. You may stop using the incentive spirometer when instructed by your caregiver.  RISKS AND COMPLICATIONS  Take your time so you do not get dizzy or light-headed.  If you are in pain, you may need to take or ask for pain medication before doing incentive spirometry. It is harder to take a deep breath if you are having pain. AFTER USE  Rest and breathe slowly and  easily.  It can be helpful to keep track of a log of your progress. Your caregiver can provide you with a simple table to help with this. If you are using the spirometer at home, follow these instructions: [place] IF:   You are having difficultly using the spirometer.  You have trouble using the spirometer as often as instructed.  Your pain medication is not giving enough relief while using the spirometer.  You develop fever of 100.5 F (38.1 C) or higher. SEEK IMMEDIATE MEDICAL CARE IF:   You cough up bloody sputum that had not been present before.  You develop fever of 102 F (38.9 C) or greater.  You develop worsening pain at or near the incision site. MAKE SURE YOU:   Understand these instructions.  Will watch your condition.  Will get help right away if you are not doing well or get worse. Document Released: 02/10/2007 Document Revised: 12/23/2011 Document Reviewed: 04/13/2007 [facility] Patient Information 2014 [place], [place].   ________________________________________________________________________

## 2017-02-06 NOTE — Progress Notes (Signed)
Please place orders in EPIC as patient is being scheduled for a Pre-op appointment! Thank you! 

## 2017-02-07 ENCOUNTER — Encounter (HOSPITAL_COMMUNITY)
Admission: RE | Admit: 2017-02-07 | Discharge: 2017-02-07 | Disposition: A | Discharge status: Home / Self Care | Payer: Worker's Compensation | Source: Ambulatory Visit | Attending: Orthopedic Surgery | Admitting: Orthopedic Surgery

## 2017-02-07 ENCOUNTER — Encounter (HOSPITAL_COMMUNITY): Payer: Self-pay

## 2017-02-07 HISTORY — DX: Sleep apnea, unspecified: G47.30

## 2017-02-07 LAB — BASIC METABOLIC PANEL
ANION GAP: 10 (ref 5–15)
BUN: 16 mg/dL (ref 6–20)
CALCIUM: 9.5 mg/dL (ref 8.9–10.3)
CO2: 23 mmol/L (ref 22–32)
CREATININE: 0.78 mg/dL (ref 0.61–1.24)
Chloride: 98 mmol/L — ABNORMAL LOW (ref 101–111)
GFR calc Af Amer: >60 mL/min (ref >60)
GLUCOSE: 124 mg/dL — AB (ref 65–99)
Potassium: 4.3 mmol/L (ref 3.5–5.1)
Sodium: 131 mmol/L — ABNORMAL LOW (ref 135–145)

## 2017-02-07 LAB — CBC
HCT: 27.7 % — ABNORMAL LOW (ref 39.0–52.0)
HEMOGLOBIN: 9.4 g/dL — AB (ref 13.0–17.0)
MCH: 30.7 pg (ref 26.0–34.0)
MCHC: 33.9 g/dL (ref 30.0–36.0)
MCV: 90.5 fL (ref 78.0–100.0)
Platelets: 437 10*3/uL — ABNORMAL HIGH (ref 150–400)
RBC: 3.06 MIL/uL — ABNORMAL LOW (ref 4.22–5.81)
RDW: 15.3 % (ref 11.5–15.5)
WBC: 6.4 10*3/uL (ref 4.0–10.5)

## 2017-02-07 NOTE — Progress Notes (Signed)
Cbc and bmp routed to Dr. Alvan Dame via epic 02/07/17

## 2017-02-07 NOTE — Progress Notes (Signed)
Echo 01/08/17 epic

## 2017-02-09 NOTE — H&P (Signed)
[name] is an 56 y.o. male.    Chief Complaint:   Right hip pain  HPI: Pt is a 56 y.o. male complaining of right hip pain. Pain had continually increased since the beginning. Pain started on 12/27/2016.  Since that time he has undergone 2 I&D's of the right hip.  He recent returned to the clinic with increasing complaints with regard to the right hip.  Trying to salvage the right hip the plan was discussed with the patient for a repeat I&D.  Various options are discussed with the patient. Risks, benefits and expectations were discussed with the patient. Patient understand the risks, benefits and expectations and wishes to proceed with surgery.    PCP: No PCP Per Patient  D/C Plans:       Home   Post-op Meds:       No Rx given  Tranexamic Acid:      To be given - IV   Decadron:      Is to be given  FYI:     ASA  Norco  DME:   Pt already has equipment  PT:  No PT   PMH: Past Medical History:  Diagnosis Date  . Arthritis   . History of blood transfusion   . Hypertension   . PONV (postoperative nausea and vomiting)   . Sleep apnea    hx of had surgery for sleep apnea    PSH: Past Surgical History:  Procedure Laterality Date  . ANTERIOR HIP REVISION Left 07/17/2015   Procedure: LEFT POSTERIOR  HIP REVISION;  Surgeon: Paralee Cancel, MD;  Location: [facility];  Service: Orthopedics;  Laterality: Left;  . BACK SURGERY     four back surgeries (3 fusions)  . INCISION AND DRAINAGE HIP Right 01/01/2017   Procedure: IRRIGATION AND DEBRIDEMENT RIGHT TOTAL HIP WITH HEAD AND LINER EXCHANGE AND PLACEMENT OF STIMULIN BEADS;  Surgeon: Paralee Cancel, MD;  Location: [facility];  Service: Orthopedics;  Laterality: Right;  . INCISION AND DRAINAGE HIP Right 01/07/2017   Procedure: IRRIGATION AND DEBRIDEMENT HIP;  Surgeon: Paralee Cancel, MD;  Location: [facility];  Service: Orthopedics;  Laterality: Right;  . JOINT REPLACEMENT     3 surgeries on right hip, 1 on left hip  . PICC LINE PLACE PERIPHERAL  ([place] HX)     present at preop appt. 02/07/17 right arm  . right knee surgery - torn maniscus    . TEE WITHOUT CARDIOVERSION N/A 01/08/2017   Procedure: TRANSESOPHAGEAL ECHOCARDIOGRAM (TEE);  Surgeon: Josue Hector, MD;  Location: [facility];  Service: Cardiovascular;  Laterality: N/A;  . TONSILLECTOMY     adnoids removed  . TOTAL HIP REVISION Right 05/27/2016   Procedure: TOTAL HIP REVISION;  Surgeon: Paralee Cancel, MD;  Location: [facility];  Service: Orthopedics;  Laterality: Right;    Social History:  reports that he quit smoking about 12 years ago. He has never used smokeless tobacco. He reports that he does not drink alcohol or use drugs.  Allergies:  Allergies  Allergen Reactions  . Cephalexin Rash  . Ace Inhibitors Rash  . Doxycycline Rash    Medications: No current facility-administered medications for this encounter.    Current Outpatient Prescriptions  Medication Sig Dispense Refill  . amLODipine (NORVASC) 2.5 MG tablet Take 2.5 mg by mouth daily.    . carvedilol (COREG) 12.5 MG tablet Take 12.5 mg by mouth 2 (two) times daily.    . celecoxib (CELEBREX) 200 MG capsule Take 200 mg by  mouth daily. with food  1  . daptomycin (CUBICIN) IVPB Inject 800 mg into the vein daily. Indication:  MSSA right THR infection and bacteremia Last Day of Therapy:  02/12/2017 Labs - Once weekly:  CBC/D, BMP, and CPK Labs - Every other week:  ESR and CRP 35 Units 0  . ferrous sulfate 325 (65 FE) MG tablet Take 1 tablet (325 mg total) by mouth 3 (three) times daily after meals. (Patient taking differently: Take 325 mg by mouth 2 (two) times daily. )  3  . HYDROcodone-acetaminophen (NORCO/VICODIN) 5-325 MG tablet Take 1-2 tablets by mouth every 4 (four) hours as needed (For pain.). (Patient taking differently: Take 0.5-1 tablets by mouth every 4 (four) hours as needed (For pain.). ) 60 tablet 0  . Multiple Vitamin (MULTIVITAMIN WITH MINERALS) TABS tablet Take 1 tablet by mouth daily.    . rifampin  (RIFADIN) 300 MG capsule Take 1 capsule (300 mg total) by mouth daily. 42 capsule 0  . valsartan (DIOVAN) 320 MG tablet Take 320 mg by mouth every morning.     . Vitamin D, Ergocalciferol, (DRISDOL) 50000 units CAPS capsule Take 50,000 Units by mouth every Saturday.    . docusate sodium (COLACE) 100 MG capsule Take 1 capsule (100 mg total) by mouth 2 (two) times daily. (Patient not taking: Reported on 02/04/2017) 10 capsule 0  . methocarbamol (ROBAXIN) 500 MG tablet Take 1 tablet (500 mg total) by mouth every 6 (six) hours as needed for muscle spasms. (Patient not taking: Reported on 02/04/2017) 40 tablet 0  . polyethylene glycol (MIRALAX / GLYCOLAX) packet Take 17 g by mouth 2 (two) times daily. (Patient not taking: Reported on 02/04/2017) 14 each 0      Review of Systems  Constitutional: Negative.   HENT: Negative.   Eyes: Negative.   Respiratory: Negative.   Cardiovascular: Negative.   Gastrointestinal: Negative.   Genitourinary: Negative.   Musculoskeletal: Positive for joint pain.  Skin: Negative.   Neurological: Negative.   Endo/Heme/Allergies: Negative.   Psychiatric/Behavioral: Negative.        Physical Exam  Constitutional: He is oriented to person, place, and time. He appears well-developed.  HENT:  Head: Normocephalic.  Eyes: Pupils are equal, round, and reactive to light.  Neck: Neck supple. No JVD present. No tracheal deviation present. No thyromegaly present.  Cardiovascular: Normal rate, regular rhythm and intact distal pulses.   Respiratory: Effort normal and breath sounds normal. No respiratory distress. He has no wheezes.  GI: Soft. There is no tenderness. There is no guarding.  Musculoskeletal:       Right hip: He exhibits decreased range of motion, decreased strength, tenderness, bony tenderness, swelling, deformity and laceration (previous incision). He exhibits no crepitus.  Lymphadenopathy:    He has no cervical adenopathy.  Neurological: He is alert and  oriented to person, place, and time.  Skin: Skin is warm and dry.  Psychiatric: He has a normal mood and affect.       Assessment/Plan Assessment:     Right hip  pain   Plan: Patient will undergo a repeat I&D of the right hip prosethsis on 02/10/2017 per Dr. Alvan Dame at [facility]. Risks benefits and expectations were discussed with the patient. Patient understand risks, benefits and expectations and wishes to proceed.    Christopher Pugh Orest Dygert   PA-C  02/09/2017, 10:44 AM

## 2017-02-10 ENCOUNTER — Encounter (HOSPITAL_COMMUNITY)
Admission: RE | Disposition: A | Discharge status: Home / Self Care | Payer: Self-pay | Source: Ambulatory Visit | Attending: Orthopedic Surgery

## 2017-02-10 ENCOUNTER — Inpatient Hospital Stay (HOSPITAL_COMMUNITY): Payer: Worker's Compensation | Admitting: Anesthesiology

## 2017-02-10 ENCOUNTER — Encounter (HOSPITAL_COMMUNITY): Payer: Self-pay | Admitting: *Deleted

## 2017-02-10 ENCOUNTER — Inpatient Hospital Stay (HOSPITAL_COMMUNITY)
Admission: RE | Admit: 2017-02-10 | Discharge: 2017-02-13 | DRG: 481 | Disposition: A | Discharge status: Home / Self Care | Payer: Worker's Compensation | Source: Ambulatory Visit | Attending: Orthopedic Surgery | Admitting: Orthopedic Surgery

## 2017-02-10 DIAGNOSIS — Z96641 Presence of right artificial hip joint: Secondary | ICD-10-CM | POA: Diagnosis present

## 2017-02-10 DIAGNOSIS — Z79899 Other long term (current) drug therapy: Secondary | ICD-10-CM

## 2017-02-10 DIAGNOSIS — T8451XA Infection and inflammatory reaction due to internal right hip prosthesis, initial encounter: Principal | ICD-10-CM | POA: Diagnosis present

## 2017-02-10 DIAGNOSIS — Z6838 Body mass index (BMI) 38.0-38.9, adult: Secondary | ICD-10-CM | POA: Diagnosis not present

## 2017-02-10 DIAGNOSIS — Z888 Allergy status to other drugs, medicaments and biological substances status: Secondary | ICD-10-CM

## 2017-02-10 DIAGNOSIS — Z7982 Long term (current) use of aspirin: Secondary | ICD-10-CM

## 2017-02-10 DIAGNOSIS — Z87891 Personal history of nicotine dependence: Secondary | ICD-10-CM | POA: Diagnosis not present

## 2017-02-10 DIAGNOSIS — D62 Acute posthemorrhagic anemia: Secondary | ICD-10-CM | POA: Diagnosis present

## 2017-02-10 DIAGNOSIS — E669 Obesity, unspecified: Secondary | ICD-10-CM | POA: Diagnosis present

## 2017-02-10 DIAGNOSIS — Z881 Allergy status to other antibiotic agents status: Secondary | ICD-10-CM | POA: Diagnosis not present

## 2017-02-10 DIAGNOSIS — G4730 Sleep apnea, unspecified: Secondary | ICD-10-CM | POA: Diagnosis present

## 2017-02-10 DIAGNOSIS — Z981 Arthrodesis status: Secondary | ICD-10-CM

## 2017-02-10 DIAGNOSIS — Y831 Surgical operation with implant of artificial internal device as the cause of abnormal reaction of the patient, or of later complication, without mention of misadventure at the time of the procedure: Secondary | ICD-10-CM | POA: Diagnosis present

## 2017-02-10 DIAGNOSIS — Z96649 Presence of unspecified artificial hip joint: Secondary | ICD-10-CM

## 2017-02-10 DIAGNOSIS — T8459XA Infection and inflammatory reaction due to other internal joint prosthesis, initial encounter: Secondary | ICD-10-CM

## 2017-02-10 DIAGNOSIS — I10 Essential (primary) hypertension: Secondary | ICD-10-CM | POA: Diagnosis present

## 2017-02-10 DIAGNOSIS — T8450XA Infection and inflammatory reaction due to unspecified internal joint prosthesis, initial encounter: Secondary | ICD-10-CM

## 2017-02-10 DIAGNOSIS — M1990 Unspecified osteoarthritis, unspecified site: Secondary | ICD-10-CM | POA: Diagnosis present

## 2017-02-10 HISTORY — PX: INCISION AND DRAINAGE HIP: SHX1801

## 2017-02-10 SURGERY — IRRIGATION AND DEBRIDEMENT HIP
Anesthesia: Monitor Anesthesia Care | Site: Hip | Laterality: Right

## 2017-02-10 MED ORDER — PHENYLEPHRINE HCL 10 MG/ML IJ SOLN
INTRAMUSCULAR | Status: DC | PRN
  Administered 2017-02-10: 50 ug/min via INTRAVENOUS

## 2017-02-10 MED ORDER — ONDANSETRON HCL 4 MG/2ML IJ SOLN
INTRAMUSCULAR | Status: DC | PRN
  Administered 2017-02-10: 4 mg via INTRAVENOUS

## 2017-02-10 MED ORDER — ONDANSETRON HCL 4 MG/2ML IJ SOLN
INTRAMUSCULAR | Status: AC
  Filled 2017-02-10: qty 2

## 2017-02-10 MED ORDER — MEPERIDINE HCL 50 MG/ML IJ SOLN: 6.2500 mg | INTRAMUSCULAR | Status: DC | PRN

## 2017-02-10 MED ORDER — PHENOL 1.4 % MT LIQD: 1.0000 | OROMUCOSAL | Status: DC | PRN

## 2017-02-10 MED ORDER — ASPIRIN 81 MG PO CHEW
81.0000 mg | CHEWABLE_TABLET | Freq: Two times a day (BID) | ORAL | Status: DC
  Administered 2017-02-10 – 2017-02-13 (×6): 81 mg via ORAL
  Filled 2017-02-10 (×6): qty 1

## 2017-02-10 MED ORDER — OXYCODONE HCL 5 MG/5ML PO SOLN: 5.0000 mg | Freq: Once | ORAL | Status: DC | PRN

## 2017-02-10 MED ORDER — PROPOFOL 10 MG/ML IV BOLUS
INTRAVENOUS | Status: DC | PRN
  Administered 2017-02-10: 200 mg via INTRAVENOUS

## 2017-02-10 MED ORDER — ACETAMINOPHEN 325 MG PO TABS: 325.0000 mg | ORAL_TABLET | ORAL | Status: DC | PRN

## 2017-02-10 MED ORDER — POLYETHYLENE GLYCOL 3350 17 G PO PACK
17.0000 g | PACK | Freq: Two times a day (BID) | ORAL | Status: DC
  Administered 2017-02-11 – 2017-02-12 (×2): 17 g via ORAL
  Filled 2017-02-10 (×4): qty 1

## 2017-02-10 MED ORDER — VANCOMYCIN HCL 1000 MG IV SOLR
INTRAVENOUS | Status: AC
  Filled 2017-02-10: qty 1000

## 2017-02-10 MED ORDER — DEXAMETHASONE SODIUM PHOSPHATE 10 MG/ML IJ SOLN
10.0000 mg | Freq: Once | INTRAMUSCULAR | Status: AC
  Administered 2017-02-11: 10 mg via INTRAVENOUS
  Filled 2017-02-10: qty 1

## 2017-02-10 MED ORDER — SUGAMMADEX SODIUM 200 MG/2ML IV SOLN
INTRAVENOUS | Status: AC
  Filled 2017-02-10: qty 2

## 2017-02-10 MED ORDER — ROCURONIUM BROMIDE 10 MG/ML (PF) SYRINGE
PREFILLED_SYRINGE | INTRAVENOUS | Status: DC | PRN
  Administered 2017-02-10: 10 mg via INTRAVENOUS
  Administered 2017-02-10: 40 mg via INTRAVENOUS

## 2017-02-10 MED ORDER — CELECOXIB 200 MG PO CAPS
200.0000 mg | ORAL_CAPSULE | Freq: Two times a day (BID) | ORAL | Status: DC
  Administered 2017-02-10 – 2017-02-13 (×6): 200 mg via ORAL
  Filled 2017-02-10 (×6): qty 1

## 2017-02-10 MED ORDER — MAGNESIUM CITRATE PO SOLN: 1.0000 | Freq: Once | ORAL | Status: DC | PRN

## 2017-02-10 MED ORDER — FENTANYL CITRATE (PF) 100 MCG/2ML IJ SOLN
INTRAMUSCULAR | Status: AC
  Filled 2017-02-10: qty 2

## 2017-02-10 MED ORDER — METHOCARBAMOL 1000 MG/10ML IJ SOLN
500.0000 mg | Freq: Four times a day (QID) | INTRAMUSCULAR | Status: DC | PRN
  Administered 2017-02-10: 500 mg via INTRAVENOUS
  Filled 2017-02-10: qty 550

## 2017-02-10 MED ORDER — LIDOCAINE 2% (20 MG/ML) 5 ML SYRINGE
INTRAMUSCULAR | Status: AC
  Filled 2017-02-10: qty 5

## 2017-02-10 MED ORDER — LACTATED RINGERS IV SOLN
INTRAVENOUS | Status: DC | PRN
  Administered 2017-02-10 (×2): via INTRAVENOUS

## 2017-02-10 MED ORDER — METHOCARBAMOL 500 MG PO TABS
500.0000 mg | ORAL_TABLET | Freq: Four times a day (QID) | ORAL | Status: DC | PRN
  Administered 2017-02-12 – 2017-02-13 (×2): 500 mg via ORAL
  Filled 2017-02-10 (×2): qty 1

## 2017-02-10 MED ORDER — KETOROLAC TROMETHAMINE 30 MG/ML IJ SOLN: 30.0000 mg | Freq: Once | INTRAMUSCULAR | Status: DC | PRN

## 2017-02-10 MED ORDER — SUCCINYLCHOLINE CHLORIDE 200 MG/10ML IV SOSY
PREFILLED_SYRINGE | INTRAVENOUS | Status: AC
  Filled 2017-02-10: qty 10

## 2017-02-10 MED ORDER — ALUM & MAG HYDROXIDE-SIMETH 200-200-20 MG/5ML PO SUSP: 15.0000 mL | ORAL | Status: DC | PRN

## 2017-02-10 MED ORDER — PROPOFOL 10 MG/ML IV BOLUS
INTRAVENOUS | Status: AC
  Filled 2017-02-10: qty 20

## 2017-02-10 MED ORDER — BISACODYL 10 MG RE SUPP: 10.0000 mg | Freq: Every day | RECTAL | Status: DC | PRN

## 2017-02-10 MED ORDER — DEXAMETHASONE SODIUM PHOSPHATE 10 MG/ML IJ SOLN
INTRAMUSCULAR | Status: AC
  Filled 2017-02-10: qty 1

## 2017-02-10 MED ORDER — DOCUSATE SODIUM 100 MG PO CAPS
100.0000 mg | ORAL_CAPSULE | Freq: Two times a day (BID) | ORAL | Status: DC
  Administered 2017-02-10 – 2017-02-13 (×6): 100 mg via ORAL
  Filled 2017-02-10 (×6): qty 1

## 2017-02-10 MED ORDER — OXYCODONE HCL 5 MG PO TABS: 5.0000 mg | ORAL_TABLET | Freq: Once | ORAL | Status: DC | PRN

## 2017-02-10 MED ORDER — METOCLOPRAMIDE HCL 5 MG/ML IJ SOLN: 5.0000 mg | Freq: Three times a day (TID) | INTRAMUSCULAR | Status: DC | PRN

## 2017-02-10 MED ORDER — CHLORHEXIDINE GLUCONATE 4 % EX LIQD: 60.0000 mL | Freq: Once | CUTANEOUS | Status: DC

## 2017-02-10 MED ORDER — LIDOCAINE 2% (20 MG/ML) 5 ML SYRINGE
INTRAMUSCULAR | Status: DC | PRN
  Administered 2017-02-10: 100 mg via INTRAVENOUS

## 2017-02-10 MED ORDER — MIDAZOLAM HCL 2 MG/2ML IJ SOLN
INTRAMUSCULAR | Status: AC
  Filled 2017-02-10: qty 2

## 2017-02-10 MED ORDER — SODIUM CHLORIDE 0.9 % IV SOLN
800.0000 mg | INTRAVENOUS | Status: DC
  Administered 2017-02-10 – 2017-02-13 (×4): 800 mg via INTRAVENOUS
  Filled 2017-02-10 (×5): qty 16

## 2017-02-10 MED ORDER — ACETAMINOPHEN 160 MG/5ML PO SOLN: 325.0000 mg | ORAL | Status: DC | PRN

## 2017-02-10 MED ORDER — ONDANSETRON HCL 4 MG/2ML IJ SOLN: 4.0000 mg | Freq: Once | INTRAMUSCULAR | Status: DC | PRN

## 2017-02-10 MED ORDER — DIPHENHYDRAMINE HCL 25 MG PO CAPS
25.0000 mg | ORAL_CAPSULE | Freq: Four times a day (QID) | ORAL | Status: DC | PRN
  Administered 2017-02-10 – 2017-02-11 (×3): 25 mg via ORAL
  Filled 2017-02-10 (×3): qty 1

## 2017-02-10 MED ORDER — DEXAMETHASONE SODIUM PHOSPHATE 10 MG/ML IJ SOLN
10.0000 mg | Freq: Once | INTRAMUSCULAR | Status: AC
  Administered 2017-02-10: 10 mg via INTRAVENOUS

## 2017-02-10 MED ORDER — PHENYLEPHRINE 40 MCG/ML (10ML) SYRINGE FOR IV PUSH (FOR BLOOD PRESSURE SUPPORT)
PREFILLED_SYRINGE | INTRAVENOUS | Status: AC
  Filled 2017-02-10: qty 10

## 2017-02-10 MED ORDER — FERROUS SULFATE 325 (65 FE) MG PO TABS
325.0000 mg | ORAL_TABLET | Freq: Three times a day (TID) | ORAL | Status: DC
  Administered 2017-02-11 – 2017-02-13 (×7): 325 mg via ORAL
  Filled 2017-02-10 (×7): qty 1

## 2017-02-10 MED ORDER — HYDROMORPHONE HCL 1 MG/ML IJ SOLN
0.5000 mg | INTRAMUSCULAR | Status: DC | PRN
Start: 2017-02-10 — End: 2017-02-13
  Administered 2017-02-10 (×2): 1 mg via INTRAVENOUS
  Filled 2017-02-10 (×2): qty 1

## 2017-02-10 MED ORDER — SUGAMMADEX SODIUM 200 MG/2ML IV SOLN
INTRAVENOUS | Status: DC | PRN
  Administered 2017-02-10: 200 mg via INTRAVENOUS

## 2017-02-10 MED ORDER — PHENYLEPHRINE HCL 10 MG/ML IJ SOLN
INTRAMUSCULAR | Status: AC
  Filled 2017-02-10: qty 1

## 2017-02-10 MED ORDER — FENTANYL CITRATE (PF) 100 MCG/2ML IJ SOLN
INTRAMUSCULAR | Status: DC | PRN
  Administered 2017-02-10: 50 ug via INTRAVENOUS
  Administered 2017-02-10: 100 ug via INTRAVENOUS
  Administered 2017-02-10 (×3): 50 ug via INTRAVENOUS

## 2017-02-10 MED ORDER — HYDROCODONE-ACETAMINOPHEN 7.5-325 MG PO TABS
1.0000 | ORAL_TABLET | ORAL | Status: DC
  Administered 2017-02-10 (×2): 2 via ORAL
  Administered 2017-02-11: 1 via ORAL
  Administered 2017-02-11 (×4): 2 via ORAL
  Administered 2017-02-12: 1 via ORAL
  Administered 2017-02-12 (×4): 2 via ORAL
  Administered 2017-02-13 (×5): 1 via ORAL
  Filled 2017-02-10: qty 1
  Filled 2017-02-10: qty 2
  Filled 2017-02-10: qty 1
  Filled 2017-02-10: qty 2
  Filled 2017-02-10: qty 1
  Filled 2017-02-10 (×4): qty 2
  Filled 2017-02-10: qty 1
  Filled 2017-02-10 (×2): qty 2
  Filled 2017-02-10: qty 1
  Filled 2017-02-10 (×2): qty 2
  Filled 2017-02-10: qty 1
  Filled 2017-02-10 (×2): qty 2

## 2017-02-10 MED ORDER — PHENYLEPHRINE 40 MCG/ML (10ML) SYRINGE FOR IV PUSH (FOR BLOOD PRESSURE SUPPORT)
PREFILLED_SYRINGE | INTRAVENOUS | Status: DC | PRN
  Administered 2017-02-10 (×5): 80 ug via INTRAVENOUS

## 2017-02-10 MED ORDER — MIDAZOLAM HCL 5 MG/5ML IJ SOLN
INTRAMUSCULAR | Status: DC | PRN
  Administered 2017-02-10: 2 mg via INTRAVENOUS

## 2017-02-10 MED ORDER — AMLODIPINE BESYLATE 5 MG PO TABS
2.5000 mg | ORAL_TABLET | Freq: Every day | ORAL | Status: DC
  Administered 2017-02-11 – 2017-02-13 (×3): 2.5 mg via ORAL
  Filled 2017-02-10 (×3): qty 1

## 2017-02-10 MED ORDER — CARVEDILOL 12.5 MG PO TABS
12.5000 mg | ORAL_TABLET | Freq: Two times a day (BID) | ORAL | Status: DC
  Administered 2017-02-10 – 2017-02-13 (×6): 12.5 mg via ORAL
  Filled 2017-02-10 (×6): qty 1

## 2017-02-10 MED ORDER — METOCLOPRAMIDE HCL 5 MG PO TABS
5.0000 mg | ORAL_TABLET | Freq: Three times a day (TID) | ORAL | Status: DC | PRN
Start: 2017-02-10 — End: 2017-02-13

## 2017-02-10 MED ORDER — SUCCINYLCHOLINE CHLORIDE 200 MG/10ML IV SOSY
PREFILLED_SYRINGE | INTRAVENOUS | Status: DC | PRN
  Administered 2017-02-10: 10 mg via INTRAVENOUS

## 2017-02-10 MED ORDER — TRANEXAMIC ACID 1000 MG/10ML IV SOLN
1000.0000 mg | INTRAVENOUS | Status: AC
  Administered 2017-02-10: 1000 mg via INTRAVENOUS
  Filled 2017-02-10: qty 1100

## 2017-02-10 MED ORDER — POTASSIUM CHLORIDE 2 MEQ/ML IV SOLN
100.0000 mL/h | INTRAVENOUS | Status: DC
  Administered 2017-02-10 – 2017-02-11 (×2): 100 mL/h via INTRAVENOUS
  Filled 2017-02-10 (×10): qty 1000

## 2017-02-10 MED ORDER — ONDANSETRON HCL 4 MG/2ML IJ SOLN: 4.0000 mg | Freq: Four times a day (QID) | INTRAMUSCULAR | Status: DC | PRN

## 2017-02-10 MED ORDER — RIFAMPIN 300 MG PO CAPS
300.0000 mg | ORAL_CAPSULE | Freq: Every day | ORAL | Status: DC
  Administered 2017-02-10 – 2017-02-13 (×5): 300 mg via ORAL
  Filled 2017-02-10 (×4): qty 1

## 2017-02-10 MED ORDER — ONDANSETRON HCL 4 MG PO TABS: 4.0000 mg | ORAL_TABLET | Freq: Four times a day (QID) | ORAL | Status: DC | PRN

## 2017-02-10 MED ORDER — DAPTOMYCIN IV (FOR PTA / DISCHARGE USE ONLY): 800.0000 mg | INTRAVENOUS | Status: DC

## 2017-02-10 MED ORDER — VANCOMYCIN HCL 1000 MG IV SOLR
INTRAVENOUS | Status: DC | PRN
  Administered 2017-02-10: 1000 mg via TOPICAL

## 2017-02-10 MED ORDER — FENTANYL CITRATE (PF) 100 MCG/2ML IJ SOLN
INTRAMUSCULAR | Status: DC
Start: 2017-02-10 — End: 2017-02-10
  Filled 2017-02-10: qty 4

## 2017-02-10 MED ORDER — SODIUM CHLORIDE 0.9 % IR SOLN
Status: DC | PRN
  Administered 2017-02-10 (×3): 3000 mL

## 2017-02-10 MED ORDER — ROCURONIUM BROMIDE 50 MG/5ML IV SOSY
PREFILLED_SYRINGE | INTRAVENOUS | Status: AC
  Filled 2017-02-10: qty 5

## 2017-02-10 MED ORDER — MENTHOL 3 MG MT LOZG: 1.0000 | LOZENGE | OROMUCOSAL | Status: DC | PRN

## 2017-02-10 MED ORDER — FENTANYL CITRATE (PF) 100 MCG/2ML IJ SOLN
25.0000 ug | INTRAMUSCULAR | Status: DC | PRN
  Administered 2017-02-10 (×2): 50 ug via INTRAVENOUS

## 2017-02-10 SURGICAL SUPPLY — 47 items
BAG ZIPLOCK 12X15 (MISCELLANEOUS) ×2 IMPLANT
BLADE SAW SGTL 11.0X1.19X90.0M (BLADE) IMPLANT
COVER SURGICAL LIGHT HANDLE (MISCELLANEOUS) ×2 IMPLANT
DERMABOND ADVANCED (GAUZE/BANDAGES/DRESSINGS) ×1
DERMABOND ADVANCED .7 DNX12 (GAUZE/BANDAGES/DRESSINGS) ×1 IMPLANT
DRAPE INCISE IOBAN 85X60 (DRAPES) ×2 IMPLANT
DRAPE ORTHO SPLIT 77X108 STRL (DRAPES) ×2
DRAPE SURG 17X11 SM STRL (DRAPES) ×2 IMPLANT
DRAPE SURG ORHT 6 SPLT 77X108 (DRAPES) ×2 IMPLANT
DRAPE U-SHAPE 47X51 STRL (DRAPES) ×2 IMPLANT
DRSG AQUACEL AG ADV 3.5X10 (GAUZE/BANDAGES/DRESSINGS) IMPLANT
DURAPREP 26ML APPLICATOR (WOUND CARE) ×2 IMPLANT
ELECT REM PT RETURN 15FT ADLT (MISCELLANEOUS) ×2 IMPLANT
EVACUATOR 1/8 PVC DRAIN (DRAIN) IMPLANT
GAUZE SPONGE 2X2 8PLY STRL LF (GAUZE/BANDAGES/DRESSINGS) ×1 IMPLANT
GAUZE SPONGE 4X4 12PLY STRL (GAUZE/BANDAGES/DRESSINGS) ×2 IMPLANT
GAUZE XEROFORM 1X8 LF (GAUZE/BANDAGES/DRESSINGS) ×4 IMPLANT
GLOVE BIOGEL M 7.0 STRL (GLOVE) IMPLANT
GLOVE BIOGEL PI IND STRL 7.5 (GLOVE) ×6 IMPLANT
GLOVE BIOGEL PI IND STRL 8.5 (GLOVE) ×1 IMPLANT
GLOVE BIOGEL PI INDICATOR 7.5 (GLOVE) ×6
GLOVE BIOGEL PI INDICATOR 8.5 (GLOVE) ×1
GLOVE ECLIPSE 8.0 STRL XLNG CF (GLOVE) IMPLANT
GLOVE ORTHO TXT STRL SZ7.5 (GLOVE) ×4 IMPLANT
GLOVE SURG ORTHO 8.0 STRL STRW (GLOVE) ×2 IMPLANT
GOWN STRL REUS W/TWL LRG LVL3 (GOWN DISPOSABLE) ×2 IMPLANT
GOWN STRL REUS W/TWL XL LVL3 (GOWN DISPOSABLE) ×4 IMPLANT
HANDPIECE INTERPULSE COAX TIP (DISPOSABLE) ×1
IV NS IRRIG 3000ML ARTHROMATIC (IV SOLUTION) ×6 IMPLANT
KIT BASIN OR (CUSTOM PROCEDURE TRAY) ×2 IMPLANT
MANIFOLD NEPTUNE II (INSTRUMENTS) ×2 IMPLANT
PACK TOTAL JOINT (CUSTOM PROCEDURE TRAY) ×2 IMPLANT
PAD ABD 8X10 STRL (GAUZE/BANDAGES/DRESSINGS) ×6 IMPLANT
POSITIONER SURGICAL ARM (MISCELLANEOUS) ×2 IMPLANT
SET HNDPC FAN SPRY TIP SCT (DISPOSABLE) ×1 IMPLANT
SPONGE GAUZE 2X2 STER 10/PKG (GAUZE/BANDAGES/DRESSINGS) ×1
STAPLER VISISTAT 35W (STAPLE) ×2 IMPLANT
SUT ETHILON 2 0 PS N (SUTURE) ×2 IMPLANT
SUT MNCRL AB 4-0 PS2 18 (SUTURE) IMPLANT
SUT PDS AB 1 CT1 27 (SUTURE) ×6 IMPLANT
SUT VIC AB 1 CT1 36 (SUTURE) ×4 IMPLANT
SUT VIC AB 2-0 CT1 27 (SUTURE) ×2
SUT VIC AB 2-0 CT1 TAPERPNT 27 (SUTURE) ×2 IMPLANT
SWAB COLLECTION DEVICE MRSA (MISCELLANEOUS) ×4 IMPLANT
SWAB CULTURE ESWAB REG 1ML (MISCELLANEOUS) ×2 IMPLANT
TAPE PAPER 3X10 WHT MICROPORE (GAUZE/BANDAGES/DRESSINGS) ×2 IMPLANT
TOWEL OR 17X26 10 PK STRL BLUE (TOWEL DISPOSABLE) ×4 IMPLANT

## 2017-02-10 NOTE — Transfer of Care (Signed)
Immediate Anesthesia Transfer of Care Note  Patient: [name]  Procedure(s) Performed: Procedure(s): Repeat IRRIGATION AND DEBRIDEMENT HIP (Right)  Patient Location: PACU  Anesthesia Type:General  Level of Consciousness: sedated  Airway & Oxygen Therapy: Patient Spontanous Breathing and Patient connected to face mask oxygen  Post-op Assessment: Report given to RN and Post -op Vital signs reviewed and unstable, Anesthesiologist notified  Post vital signs: Reviewed and stable  Last Vitals:  Vitals:   02/10/17 0748  BP: (!) 151/79  Pulse: 84  Resp: 18  Temp: 36.7 C    Last Pain:  Vitals:   02/10/17 0835  TempSrc:   PainSc: 2       Patients Stated Pain Goal: 3 (62/83/15 1761)  Complications: No apparent anesthesia complications

## 2017-02-10 NOTE — Op Note (Signed)
NAMEMarland Kitchen  TAEGAN, STANDAGE                ACCOUNT NO.:  [id number]  MEDICAL RECORD NO.:  [id number]  LOCATION:                                 FACILITY:  PHYSICIAN:  Pietro Cassis. Alvan Dame, M.D.  DATE OF BIRTH:  [birth year]  DATE OF PROCEDURE:  02/10/2017 DATE OF DISCHARGE:                              OPERATIVE REPORT   PREOPERATIVE DIAGNOSIS:  Status post excisional and nonexcisional debridement of a right hip infection with persistent wound drainage.  POSTOPERATIVE DIAGNOSIS:  Status post excisional and nonexcisional debridement of a right hip infection with persistent wound drainage.  PROCEDURES: 1. Excisional debridement of right hip involving about an 8-inch     portion of incision involving skin and nonviable tissue sharply     with a scalpel and Bovie. 2. Nonexcisional debridement of the right hip wound with 9 L of normal     saline solution.  SURGEON:  Pietro Cassis. Alvan Dame, M.D.  ASSISTANT:  Danae Orleans, PA.  Noted that [name] was present for the entirety of the procedure from preoperative position to perioperative management of the operative extremity, general facilitation of the case, and primary wound closure.  ANESTHESIA:  General.  SPECIMENS:  I took fluid samples from the superficial aspect of his wound deep to the skin, but superficial to the iliotibial band as well as deep culture swabs.  I did identify a thin purulent appearance to his synovial fluid in addition to the blood that was around there.  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  DRAINS:  None.  I did use vancomycin powder in the deep portion of his incision as well as superficial.  No beads were applied.  INDICATIONS FOR PROCEDURE:  Mr. Costlow is a pleasant 56 year old male with complex history involving his right hip.  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  He has been doing well until he presented about 4 weeks ago with increasing hip pain.  At that time, there was concern for infection and went to the operating room somewhat urgently due to impending sepsis.  His wound was evacuated and found to have significant purulence.  He was seen and evaluated by Infectious Disease, placed on Cubicin and rifampin with a goal to try to salvage his joint at that initial index procedure, he had a liner and ball exchange.  I took him back to the OR 3 days later for repeat I and D.  Finding at that time; no evidence of obvious infection, just hematoma and seromatous fluid within the layers of his wound.  He had been doing well and had been seen in the office and then noted to recently have some serous drainage from the midportion of his wound. Given the wound drainage, I felt it is going to be his best interest to treat this aggressively, not knowing what to expect.  He, in the interim, had began to develop left hip pain after previous arthroplasty as well with concerns of potential intra-articular involvement of that joint based on his  initial presentation.  The plan was to take him to the operating room for I and D of this hip and then have Interventional Radiology while he is in the hospital aspirate his left hip joint to rule out infection issues.  This is all in the setting of him being on Cubicin, which may represent a little bit of a challenge for him in terms of identifying infection.  However, we will evaluate for cell count with his aspiration.  Risks, benefits, and necessity of the procedure were discussed.  I reviewed with him at length that if this failed to provide adequate success of either treatment and/or suppression, then the next stage will be to remove all his implants.  DESCRIPTION OF PROCEDURE:  The patient was brought to the operative theater.  Once adequate anesthesia was  established, he was positioned into the left lateral decubitus position with the right side up.  The right lower extremity was then prepped and draped in sterile fashion. Time-out was performed, identifying the patient, planned procedure, and extremity.  Please note, he is on therapeutic doses of Cubicin, no other antibiotics given.  Following the time-out, his right hip incision was excised, removing nonviable tissue, superficial and deep.  Once I got through the subdermal layer, we did identify a seromatous fluid with a slight thin purulent appearance.  Cultures were taken in this layer.  His iliotibial band layer was intact with suture line still present.  Following the obtaining cultures in this layer, I did incise the deep in this layer. At the hip joint area, there was again a seromatous fluid with a thin purulence to it.  Cultures taken again.  Once I had excised any nonviable tissue going down to the hip joint with the use of Bovie and a knife, I then irrigated the wound with 9 L of normal saline solution.  Once this was completed and further debridements of nonviable tissue carried out with the use of rongeurs, pituitary, and other, I then placed about 500 mL of vancomycin powder into the deep aspect of his wound.  We then reapproximated the iliotibial band and gluteal fascia using #1 PDS suture in an interrupted fashion.  The remaining vancomycin powder was sprinkled into the superficial wound and this was closed with 2-0 Vicryl and then staples.  The wound was cleaned, dried, and dressed sterilely using Xeroform and a bulky dressing.  He was then brought to the recovery room in stable condition.  Again, postoperatively, he will be weightbearing as tolerated.  We have set up an aspiration by Interventional Radiology to evaluate for cell count, culture of his contralateral left hip.  I will reinitiate a consult with Infectious Disease to get their involvement.     Pietro Cassis Alvan Dame, M.D.     MDO/MEDQ  D:  02/10/2017  T:  02/10/2017  Job:  [id number]

## 2017-02-10 NOTE — Anesthesia Postprocedure Evaluation (Addendum)
Anesthesia Post Note  Patient: [name]  Procedure(s) Performed: Procedure(s) (LRB): Repeat IRRIGATION AND DEBRIDEMENT HIP (Right)  Patient location during evaluation: PACU Anesthesia Type: MAC Level of consciousness: awake and sedated Pain management: pain level controlled Vital Signs Assessment: post-procedure vital signs reviewed and stable Respiratory status: spontaneous breathing Cardiovascular status: stable Postop Assessment: no signs of nausea or vomiting Anesthetic complications: no        Last Vitals:  Vitals:   02/10/17 1215 02/10/17 1230  BP: 133/68 134/68  Pulse: 77 70  Resp: 14 10  Temp:      Last Pain:  Vitals:   02/10/17 1230  TempSrc:   PainSc: Asleep   Pain Goal: Patients Stated Pain Goal: 3 (02/10/17 0835)        RLE Sensation: Full sensation (02/10/17 1230)      Monica Codd JR,JOHN Mateo Flow

## 2017-02-10 NOTE — Anesthesia Procedure Notes (Signed)
Procedure Name: Intubation Date/Time: 02/10/2017 10:06 AM Performed by: Lind Covert Pre-anesthesia Checklist: Patient identified, Suction available, Patient being monitored, Timeout performed and Emergency Drugs available Patient Re-evaluated:Patient Re-evaluated prior to inductionOxygen Delivery Method: Circle system utilized Preoxygenation: Pre-oxygenation with 100% oxygen Intubation Type: IV induction Laryngoscope Size: Mac and 4 Grade View: Grade I Tube type: Oral Tube size: 7.5 mm Number of attempts: 1 Airway Equipment and Method: Stylet Placement Confirmation: ETT inserted through vocal cords under direct vision,  breath sounds checked- equal and bilateral and positive ETCO2 Secured at: 22 cm Tube secured with: Tape Dental Injury: Teeth and Oropharynx as per pre-operative assessment

## 2017-02-10 NOTE — Anesthesia Preprocedure Evaluation (Addendum)
Anesthesia Evaluation  Patient identified by MRN, date of birth, ID band Patient awake    Reviewed: Allergy & Precautions, NPO status , Patient's Chart, lab work & pertinent test results, reviewed documented beta blocker date and time   History of Anesthesia Complications (+) PONV  Airway Mallampati: I       Dental no notable dental hx.    Pulmonary former smoker,    Pulmonary exam normal        Cardiovascular hypertension, Pt. on medications and Pt. on home beta blockers Normal cardiovascular exam Rhythm:Regular Rate:Normal + Systolic murmurs    Neuro/Psych negative neurological ROS  negative psych ROS   GI/Hepatic negative GI ROS, Neg liver ROS,   Endo/Other  negative endocrine ROS  Renal/GU   negative genitourinary   Musculoskeletal   Abdominal (+) + obese,   Peds  Hematology  (+) Blood dyscrasia, anemia ,   Anesthesia Other Findings Study Result   Result status: Final result                             *[place]*                   *[facility]*                         [address], [place] [address]                            [phone]  ------------------------------------------------------------------- Transesophageal Echocardiography  Patient:    Elyan, Vanwieren MR #:       [id number] Study Date: 01/08/2017 Gender:     M Age:        56 Height:     170.2 cm Weight:     110 kg BSA:        2.33 m^2 Pt. Status: Room:       [place]   Jenkins Rouge, M.D.  Levin Bacon [place], [place]  SONOGRAPHER  Madelin Rear, RDCS  ORDERING     Cheryln Manly  REFERRING    Reino Bellis B  cc:  ------------------------------------------------------------------- LV EF: 65% -   70%  ------------------------------------------------------------------- Indications:      Bacteremia  790.7.  ------------------------------------------------------------------- History:   Risk factors:  Hypertension.  ------------------------------------------------------------------- Study Conclusions  - Left ventricle: Systolic function was vigorous. The estimated   ejection fraction was in the range of 65% to 70%. Wall motion was   normal; there were no regional wall motion abnormalities. - Left atrium: No evidence of thrombus in the atrial cavity or   appendage. - Right atrium: No evidence of thrombus in the atrial cavity or   appendage. - Atrial septum: No defect or patent foramen ovale was identified. - Impressions: Despite anesthesia and propofol patient had copious   secretions and airway distress during study with coughing which   limited duration   scope could be kept in esophagus. Sat&'s remained above 90% at all   times  Impressions:  - Despite anesthesia and propofol patient had copious secretions   and airway distress during study with coughing which limited   duration   scope  could be kept in esophagus. Sat&'s remained above 90% at all   times No evidence of endocarditis. There was no evidence of a   vegetation.  ------------------------------------------------------------------- Study data:   Study status:  Routine.  Consent:  The risks, benefits, and alternatives to the procedure were explained to the patient and informed consent was obtained.  Procedure:  The patient reported no pain pre or post test. Initial setup. The patient was brought to the laboratory. Surface ECG leads were monitored. Sedation. Deep sedation was administered by anesthesiology staff. Transesophageal echocardiography. An adult multiplane transesophageal probe was inserted by the attending cardiologistwithout difficulty. Image quality was adequate.  Study completion:  The patient tolerated the procedure well. There were no complications.  Administered medications:    Propofol. Diagnostic transesophageal echocardiography.  2D and color Doppler.  Birthdate:  Patient birthdate: 1961/01/02.  Age:  Patient is 56 yr old.  Sex:  Gender: male.    BMI: 38 kg/m^2.  Blood pressure: 146/64  Patient status:  Inpatient.  Study date:  Study date: 01/08/2017. Study time: 01:56 PM.  Location:  Endoscopy.  -------------------------------------------------------------------  ------------------------------------------------------------------- Left ventricle:  Systolic function was vigorous. The estimated ejection fraction was in the range of 65% to 70%. Wall motion was normal; there were no regional wall motion abnormalities.  ------------------------------------------------------------------- Aortic valve:   Structurally normal valve. Trileaflet; normal thickness leaflets. Cusp separation was normal.  Doppler:  There was no significant regurgitation.  ------------------------------------------------------------------- Aorta:  There was no atheroma. There was no evidence for dissection. Aortic root: The aortic root was not dilated. Ascending aorta: The ascending aorta was normal in size. Aortic arch: The aortic arch was normal in size. Descending aorta: The descending aorta was normal in size.  ------------------------------------------------------------------- Mitral valve:   Structurally normal valve.   Leaflet separation was normal.  Doppler:  There was no significant regurgitation.  ------------------------------------------------------------------- Left atrium:  The atrium was normal in size.  No evidence of thrombus in the atrial cavity or appendage. The appendage was morphologically a left appendage, multilobulated, and of normal size. Emptying velocity was normal.  ------------------------------------------------------------------- Atrial septum:  No defect or patent foramen ovale was identified.    ------------------------------------------------------------------- Right ventricle:  The cavity size was normal. Wall thickness was normal. Systolic function was normal.  ------------------------------------------------------------------- Pulmonic valve:    Structurally normal valve.  ------------------------------------------------------------------- Tricuspid valve:   Structurally normal valve.   Leaflet separation was normal.  Doppler:  There was no significant regurgitation.  ------------------------------------------------------------------- Pulmonary artery:   The main pulmonary artery was normal-sized.  ------------------------------------------------------------------- Right atrium:  The atrium was normal in size.  No evidence of thrombus in the atrial cavity or appendage. The appendage was morphologically a right appendage.  ------------------------------------------------------------------- Pericardium:  The pericardium was normal in appearance. There was no pericardial effusion.   ------------------------------------------------------------------- Prepared and Electronically Authenticated by  Jenkins Rouge, M.D. 2018-03-28T17:45:23 PACS Images   Show images for TEE Patient Information   Patient Name Berk, Pilot Sex Male DOB 10/05/1961 SSN [id number] Reason For Exam  Priority: Routine  Not on file Surgical History   Surgical History    No past medical history on file.  Other Surgical History    Procedure Laterality Date Comment Source ANTERIOR HIP REVISION Left 07/17/2015 Procedure: LEFT POSTERIOR HIP REVISION; Surgeon: Paralee Cancel, MD; Location: [facility]; Service: Orthopedics; Laterality: Left; Provider BACK SURGERY   four back surgeries (3 fusions) Provider INCISION AND DRAINAGE HIP Right 01/01/2017 Procedure: IRRIGATION AND DEBRIDEMENT RIGHT TOTAL HIP WITH HEAD AND LINER  EXCHANGE AND PLACEMENT OF STIMULIN BEADS; Surgeon: Paralee Cancel,  MD; Location: [facility]; Service: Orthopedics; Laterality: Right; Provider INCISION AND DRAINAGE HIP Right 01/07/2017 Procedure: IRRIGATION AND DEBRIDEMENT HIP; Surgeon: Paralee Cancel, MD; Location: [facility]; Service: Orthopedics; Laterality: Right; Provider JOINT REPLACEMENT   3 surgeries on right hip, 1 on left hip Provider PICC LINE PLACE PERIPHERAL ([place] HX)   present at preop appt. 02/07/17 right arm Provider right knee surgery - torn maniscus    Provider TEE WITHOUT CARDIOVERSION N/A 01/08/2017 Procedure: TRANSESOPHAGEAL ECHOCARDIOGRAM (TEE); Surgeon: Josue Hector, MD; Location: [facility] ENDOSCOPY; Service: Cardiovascular; Laterality: N/A; Provider TONSILLECTOMY   adnoids removed Provider TOTAL HIP REVISION Right 05/27/2016 Procedure: TOTAL HIP REVISION; Surgeon: Paralee Cancel, MD; Location: [facility]; Service: Orthopedics; Laterality: Right; Provider  Performing Technologist/Nurse   Performing Technologist/Nurse: Aggie Cosier, NT          Implants     No active implants to display in this view. Order-Level Documents - 12/31/2016:   Scan on 01/10/2017 4:28 PM by Provider Default, MDScan on 01/10/2017 4:28 PM by Provider Default, MD    Encounter-Level Documents - 12/31/2016:   Scan on 01/10/2017 4:40 PM by Provider Default, MDScan on 01/10/2017 4:40 PM by Provider Default, MD  Scan on 01/10/2017 4:24 PM by Provider Default, MDScan on 01/10/2017 4:24 PM by Provider Default, MD  Scan on 01/10/2017 4:21 PM by Provider Default, MDScan on 01/10/2017 4:21 PM by Provider Default, MD  Scan on 01/10/2017 4:14 PM by Provider Default, MDScan on 01/10/2017 4:14 PM by Provider Default, MD  Document on 01/09/2017 2:49 PM by Franki Monte, RN : IP After Visit Summary  Scan on 01/09/2017 11:57 AM by Provider Default, MDScan on 01/09/2017 11:57 AM by Provider Default, MD  Scan on 01/09/2017 9:56 AM by Provider Default, MDScan on 01/09/2017 9:56 AM by Provider Default, MD  Scan on 01/01/2017 8:35 AM  by Provider Default, MDScan on 01/01/2017 8:35 AM by Provider Default, MD  Scan on 01/03/2017 9:05 AM by Provider Default, MDScan on 01/03/2017 9:05 AM by Provider Default, MD  Scan on 01/01/2017 10:30 PM by Provider Default, MDScan on 01/01/2017 10:30 PM by Provider Default, MD  Electronic signature on 12/31/2016 11:01 AM    Signed   Electronically signed by Josue Hector, MD on 01/08/17 at 1745 EDT Printable Result Report    Result Report  External Result Report    External Result Report   Study Result   Result status: Final result                             *[facility]*                         [address], [place] [address]                            [phone]  ------------------------------------------------------------------- Transesophageal Echocardiography  Patient:    Daniil, Labarge MR #:       [id number] Study Date: 01/08/2017 Gender:     M Age:        9 Height:  170.2 cm Weight:     110 kg BSA:        2.33 m^2 Pt. Status: Room:       [place]   Jenkins Rouge, M.D.  Levin Bacon [place], [place]  SONOGRAPHER  Madelin Rear, RDCS  ORDERING     Cheryln Manly  REFERRING    Reino Bellis B  cc:  ------------------------------------------------------------------- LV EF: 65% -   70%  ------------------------------------------------------------------- Indications:      Bacteremia 790.7.  ------------------------------------------------------------------- History:   Risk factors:  Hypertension.  ------------------------------------------------------------------- Study Conclusions  - Left ventricle: Systolic function was vigorous. The estimated   ejection fraction was in the range of 65% to 70%. Wall motion was   normal; there were no regional wall motion abnormalities. - Left atrium: No evidence of  thrombus in the atrial cavity or   appendage. - Right atrium: No evidence of thrombus in the atrial cavity or   appendage. - Atrial septum: No defect or patent foramen ovale was identified. - Impressions: Despite anesthesia and propofol patient had copious   secretions and airway distress during study with coughing which   limited duration   scope could be kept in esophagus. Sat&'s remained above 90% at all   times  Impressions:  - Despite anesthesia and propofol patient had copious secretions   and airway distress during study with coughing which limited   duration   scope could be kept in esophagus. Sat&'s remained above 90% at all   times No evidence of endocarditis. There was no evidence of a   vegetation.  ------------------------------------------------------------------- Study data:   Study status:  Routine.  Consent:  The risks, benefits, and alternatives to the procedure were explained to the patient and informed consent was obtained.  Procedure:  The patient reported no pain pre or post test. Initial setup. The patient was brought to the laboratory. Surface ECG leads were monitored. Sedation. Deep sedation was administered by anesthesiology staff. Transesophageal echocardiography. An adult multiplane transesophageal probe was inserted by the attending cardiologistwithout difficulty. Image quality was adequate.  Study completion:  The patient tolerated the procedure well. There were no complications.  Administered medications:   Propofol. Diagnostic transesophageal echocardiography.  2D and color Doppler.  Birthdate:  Patient birthdate: 1960-11-27.  Age:  Patient is 56 yr old.  Sex:  Gender: male.    BMI: 38 kg/m^2.  Blood pressure: 146/64  Patient status:  Inpatient.  Study date:  Study date: 01/08/2017. Study time: 01:56 PM.  Location:   Endoscopy.  -------------------------------------------------------------------  ------------------------------------------------------------------- Left ventricle:  Systolic function was vigorous. The estimated ejection fraction was in the range of 65% to 70%. Wall motion was normal; there were no regional wall motion abnormalities.  ------------------------------------------------------------------- Aortic valve:   Structurally normal valve. Trileaflet; normal thickness leaflets. Cusp separation was normal.  Doppler:  There was no significant regurgitation.  ------------------------------------------------------------------- Aorta:  There was no atheroma. There was no evidence for dissection. Aortic root: The aortic root was not dilated. Ascending aorta: The ascending aorta was normal in size. Aortic arch: The aortic arch was normal in size. Descending aorta: The descending aorta was normal in size.  ------------------------------------------------------------------- Mitral valve:   Structurally normal valve.   Leaflet separation was normal.  Doppler:  There was no significant regurgitation.  ------------------------------------------------------------------- Left atrium:  The atrium was normal in size.  No evidence of thrombus in the atrial cavity or appendage. The appendage  was morphologically a left appendage, multilobulated, and of normal size. Emptying velocity was normal.  ------------------------------------------------------------------- Atrial septum:  No defect or patent foramen ovale was identified.   ------------------------------------------------------------------- Right ventricle:  The cavity size was normal. Wall thickness was normal. Systolic function was normal.  ------------------------------------------------------------------- Pulmonic valve:    Structurally normal  valve.  ------------------------------------------------------------------- Tricuspid valve:   Structurally normal valve.   Leaflet separation was normal.  Doppler:  There was no significant regurgitation.  ------------------------------------------------------------------- Pulmonary artery:   The main pulmonary artery was normal-sized.  ------------------------------------------------------------------- Right atrium:  The atrium was normal in size.  No evidence of thrombus in the atrial cavity or appendage. The appendage was morphologically a right appendage.  ------------------------------------------------------------------- Pericardium:  The pericardium was normal in appearance. There was no pericardial effusion.   ------------------------------------------------------------------- Prepared and Electronically Authenticated by  Jenkins Rouge, M.D. 2018-03-28T17:45:23 PACS Images   Show images for TEE Patient Information   Patient Name Vijay, Durflinger Sex Male DOB 1961/02/21 SSN [id number] Reason For Exam  Priority: Routine  Not on file Surgical History   Surgical History    No past medical history on file.  Other Surgical History    Procedure Laterality Date Comment Source ANTERIOR HIP REVISION Left 07/17/2015 Procedure: LEFT POSTERIOR HIP REVISION; Surgeon: Paralee Cancel, MD; Location: [facility]; Service: Orthopedics; Laterality: Left; Provider BACK SURGERY   four back surgeries (3 fusions) Provider INCISION AND DRAINAGE HIP Right 01/01/2017 Procedure: IRRIGATION AND DEBRIDEMENT RIGHT TOTAL HIP WITH HEAD AND LINER EXCHANGE AND PLACEMENT OF STIMULIN BEADS; Surgeon: Paralee Cancel, MD; Location: [facility]; Service: Orthopedics; Laterality: Right; Provider INCISION AND DRAINAGE HIP Right 01/07/2017 Procedure: IRRIGATION AND DEBRIDEMENT HIP; Surgeon: Paralee Cancel, MD; Location: [facility]; Service: Orthopedics; Laterality: Right; Provider JOINT REPLACEMENT   3  surgeries on right hip, 1 on left hip Provider PICC LINE PLACE PERIPHERAL ([place] HX)   present at preop appt. 02/07/17 right arm Provider right knee surgery - torn maniscus    Provider TEE WITHOUT CARDIOVERSION N/A 01/08/2017 Procedure: TRANSESOPHAGEAL ECHOCARDIOGRAM (TEE); Surgeon: Josue Hector, MD; Location: [facility] ENDOSCOPY; Service: Cardiovascular; Laterality: N/A; Provider TONSILLECTOMY   adnoids removed Provider TOTAL HIP REVISION Right 05/27/2016 Procedure: TOTAL HIP REVISION; Surgeon: Paralee Cancel, MD; Location: [facility]; Service: Orthopedics; Laterality: Right; Provider  Performing Technologist/Nurse   Performing Technologist/Nurse: Aggie Cosier, NT          Implants     No active implants to display in this view. Order-Level Documents - 12/31/2016:   Scan on 01/10/2017 4:28 PM by Provider Default, MDScan on 01/10/2017 4:28 PM by Provider Default, MD    Encounter-Level Documents - 12/31/2016:   Scan on 01/10/2017 4:40 PM by Provider Default, MDScan on 01/10/2017 4:40 PM by Provider Default, MD  Scan on 01/10/2017 4:24 PM by Provider Default, MDScan on 01/10/2017 4:24 PM by Provider Default, MD  Scan on 01/10/2017 4:21 PM by Provider Default, MDScan on 01/10/2017 4:21 PM by Provider Default, MD  Scan on 01/10/2017 4:14 PM by Provider Default, MDScan on 01/10/2017 4:14 PM by Provider Default, MD  Document on 01/09/2017 2:49 PM by Franki Monte, RN : IP After Visit Summary  Scan on 01/09/2017 11:57 AM by Provider Default, MDScan on 01/09/2017 11:57 AM by Provider Default, MD  Scan on 01/09/2017 9:56 AM by Provider Default, MDScan on 01/09/2017 9:56 AM by Provider Default, MD  Scan on 01/01/2017 8:35 AM by Provider Default, MDScan on 01/01/2017 8:35 AM by Provider Default, MD  Scan on 01/03/2017 9:05 AM  by Provider Default, MDScan on 01/03/2017 9:05 AM by Provider Default, MD  Scan on 01/01/2017 10:30 PM by Provider Default, MDScan on 01/01/2017 10:30 PM by Provider Default, MD   Electronic signature on 12/31/2016 11:01 AM    Signed   Electronically signed by Josue Hector, MD on 01/08/17 at 1745 EDT Printable Result Report    Result Report  External Result Report    External Result Report     Reproductive/Obstetrics                            Anesthesia Physical Anesthesia Plan  ASA: II  Anesthesia Plan: General   Post-op Pain Management:    Induction: Intravenous  Airway Management Planned: Oral ETT  Additional Equipment:   Intra-op Plan:   Post-operative Plan: Extubation in OR  Informed Consent: I have reviewed the patients History and Physical, chart, labs and discussed the procedure including the risks, benefits and alternatives for the proposed anesthesia with the patient or authorized representative who has indicated his/her understanding and acceptance.   Dental advisory given  Plan Discussed with: CRNA and Surgeon  Anesthesia Plan Comments:        Anesthesia Quick Evaluation

## 2017-02-10 NOTE — Interval H&P Note (Signed)
History and Physical Interval Note:  02/10/2017 9:04 AM  [name]  has presented today for surgery, with the diagnosis of Right total hip infection  The various methods of treatment have been discussed with the patient and family. After consideration of risks, benefits and other options for treatment, the patient has consented to  Procedure(s): Repeat IRRIGATION AND DEBRIDEMENT HIP (Right) as a surgical intervention .  The patient's history has been reviewed, patient examined, no change in status, stable for surgery.  I have reviewed the patient's chart and labs.  Questions were answered to the patient's satisfaction.     [name]

## 2017-02-10 NOTE — Brief Op Note (Signed)
02/10/2017  10:04 AM  PATIENT:  [name]  56 y.o. male  PRE-OPERATIVE DIAGNOSIS:  Right total hip infection, status post I&D with persistent wound drainage  POST-OPERATIVE DIAGNOSIS:  Right total hip infection, status post I&D with persistent wound drainage  PROCEDURE:  Procedure(s): Repeat IRRIGATION AND DEBRIDEMENT HIP (Right)  SURGEON:  Surgeon(s) and Role:    * Paralee Cancel, MD - Primary  PHYSICIAN ASSISTANT: Danae Orleans, PA-C   ANESTHESIA:   general  EBL:  400 cc  BLOOD ADMINISTERED:none  DRAINS: none   LOCAL MEDICATIONS USED:  NONE  SPECIMEN:  Source of Specimen:  right hip superficial and deep cultures  DISPOSITION OF SPECIMEN:  PATHOLOGY  COUNTS:  YES  TOURNIQUET:  * No tourniquets in log *  DICTATION: .Other Dictation: Dictation Number [id number]  PLAN OF CARE: Admit to inpatient   PATIENT DISPOSITION:  PACU - hemodynamically stable.   Delay start of Pharmacological VTE agent (>24hrs) due to surgical blood loss or risk of bleeding: no

## 2017-02-11 ENCOUNTER — Inpatient Hospital Stay (HOSPITAL_COMMUNITY): Payer: Worker's Compensation

## 2017-02-11 ENCOUNTER — Encounter (HOSPITAL_COMMUNITY): Payer: Self-pay | Admitting: Orthopedic Surgery

## 2017-02-11 LAB — BASIC METABOLIC PANEL
ANION GAP: 7 (ref 5–15)
BUN: 9 mg/dL (ref 6–20)
CALCIUM: 8.9 mg/dL (ref 8.9–10.3)
CO2: 24 mmol/L (ref 22–32)
Chloride: 103 mmol/L (ref 101–111)
Creatinine, Ser: 0.67 mg/dL (ref 0.61–1.24)
GFR calc Af Amer: >60 mL/min (ref >60)
GLUCOSE: 97 mg/dL (ref 65–99)
Potassium: 4 mmol/L (ref 3.5–5.1)
Sodium: 134 mmol/L — ABNORMAL LOW (ref 135–145)

## 2017-02-11 LAB — CBC
HEMATOCRIT: 25 % — AB (ref 39.0–52.0)
Hemoglobin: 8 g/dL — ABNORMAL LOW (ref 13.0–17.0)
MCH: 29.5 pg (ref 26.0–34.0)
MCHC: 32 g/dL (ref 30.0–36.0)
MCV: 92.3 fL (ref 78.0–100.0)
PLATELETS: 390 10*3/uL (ref 150–400)
RBC: 2.71 MIL/uL — ABNORMAL LOW (ref 4.22–5.81)
RDW: 15.2 % (ref 11.5–15.5)
WBC: 4.3 10*3/uL (ref 4.0–10.5)

## 2017-02-11 MED ORDER — SODIUM CHLORIDE 0.9% FLUSH: 10.0000 mL | INTRAVENOUS | Status: DC | PRN

## 2017-02-11 MED ORDER — LIDOCAINE HCL 1 % IJ SOLN
INTRAMUSCULAR | Status: AC
  Filled 2017-02-11: qty 20

## 2017-02-11 MED ORDER — SODIUM CHLORIDE 0.9 % IJ SOLN
INTRAMUSCULAR | Status: AC
  Filled 2017-02-11: qty 20

## 2017-02-11 NOTE — Progress Notes (Signed)
Patient ID: [name], male   DOB: Jun 08, 1961, 56 y.o.   MRN: [id number] Subjective: 1 Day Post-Op Procedure(s) (LRB): Repeat IRRIGATION AND DEBRIDEMENT HIP (Right)    Patient reports pain as mild.  Doing ok in bed.  Pain in left hip with activity  Objective:   VITALS:   Vitals:   02/11/17 0101 02/11/17 0504  BP: (!) 125/57 (!) 141/59  Pulse: 72 70  Resp: 18 18  Temp: 97.6 F (36.4 C) 97.7 F (36.5 C)    Neurovascular intact Incision: dressing C/D/I  LABS  Recent Labs  02/11/17 0508  HGB 8.0*  HCT 25.0*  WBC 4.3  PLT 390     Recent Labs  02/11/17 0508  NA 134*  K 4.0  BUN 9  CREATININE 0.67  GLUCOSE 97    No results for input(s): LABPT, INR in the last 72 hours.   Assessment/Plan: 1 Day Post-Op Procedure(s) (LRB): Repeat IRRIGATION AND DEBRIDEMENT HIP (Right)   Advance diet Up with therapy   Plan for IR aspiration of left hip today vs tomorrow to rule out infection as source of pain Continue cubicin and rifampin for now Will contact ID to make them aware of his admission

## 2017-02-11 NOTE — Evaluation (Signed)
Physical Therapy Evaluation Patient Details Name: [name] MRN: [id number] DOB: October 28, 1960 Today's Date: 02/11/2017   History of Present Illness  56 year old male s/p repeat R hip I & D.    Clinical Impression  Patient is s/p above surgery resulting in functional limitations due to the deficits listed below (see PT Problem List).  Patient will benefit from skilled PT to increase their independence and safety with mobility to allow discharge to the venue listed below.  Pt tolerating mobility well.  Reviewed posterior hip precautions with pt.  Pt reports plan for L hip aspiration today.       Follow Up Recommendations Outpatient PT (resume outpatient)    Equipment Recommendations  None recommended by PT    Recommendations for Other Services       Precautions / Restrictions Precautions Precautions: Posterior Hip Precaution Comments: educated pt to maintain posterior hip precautions Restrictions Weight Bearing Restrictions: No Other Position/Activity Restrictions: WBAT      Mobility  Bed Mobility Overal bed mobility: Needs Assistance Bed Mobility: Supine to Sit     Supine to sit: Supervision;HOB elevated     General bed mobility comments: pt elevated HOB, self assisted R LE over EOB, cues for precautions  Transfers Overall transfer level: Needs assistance Equipment used: Rolling walker (2 wheeled) Transfers: Sit to/from Stand Sit to Stand: Min guard         General transfer comment: verbal cues for technique within precautions  Ambulation/Gait Ambulation/Gait assistance: Min guard Ambulation Distance (Feet): 120 Feet Assistive device: Rolling walker (2 wheeled) Gait Pattern/deviations: Step-to pattern;Decreased stance time - right     General Gait Details: verbal cues for sequence, RW positioning  Stairs            Wheelchair Mobility    Modified Rankin (Stroke Patients Only)       Balance                                              Pertinent Vitals/Pain Pain Assessment: 0-10 Pain Score: 3  Pain Location: R hip Pain Descriptors / Indicators: Aching;Sore Pain Intervention(s): Limited activity within patient's tolerance;Monitored during session;Repositioned    Home Living Family/patient expects to be discharged to:: Private residence Living Arrangements: Spouse/significant other Available Help at Discharge: Family;Available 24 hours/day   Home Access: Stairs to enter   Entrance Stairs-Number of Steps: 2 Home Layout: One level Home Equipment: Walker - 2 wheels;Crutches;Bedside commode      Prior Function Level of Independence: Independent with assistive device(s)               Hand Dominance        Extremity/Trunk Assessment        Lower Extremity Assessment Lower Extremity Assessment: RLE deficits/detail RLE Deficits / Details: difficulty performing hip movements against gravity       Communication   Communication: No difficulties  Cognition Arousal/Alertness: Awake/alert Behavior During Therapy: WFL for tasks assessed/performed Overall Cognitive Status: Within Functional Limits for tasks assessed                                        General Comments      Exercises     Assessment/Plan    PT Assessment Patient needs continued PT services  PT Problem  List Decreased strength;Decreased range of motion;Decreased mobility;Decreased knowledge of precautions;Pain       PT Treatment Interventions Functional mobility training;Stair training;DME instruction;Gait training;Therapeutic activities;Therapeutic exercise;Patient/family education    PT Goals (Current goals can be found in the Care Plan section)  Acute Rehab PT Goals PT Goal Formulation: With patient Time For Goal Achievement: 02/15/17 Potential to Achieve Goals: Good    Frequency 7X/week   Barriers to discharge        Co-evaluation               AM-PAC PT "6 Clicks" Daily Activity   Outcome Measure Difficulty turning over in bed (including adjusting bedclothes, sheets and blankets)?: None Difficulty moving from lying on back to sitting on the side of the bed? : A Little Difficulty sitting down on and standing up from a chair with arms (e.g., wheelchair, bedside commode, etc,.)?: A Little Help needed moving to and from a bed to chair (including a wheelchair)?: A Little Help needed walking in hospital room?: A Little Help needed climbing 3-5 steps with a railing? : A Little 6 Click Score: 19    End of Session Equipment Utilized During Treatment: Gait belt Activity Tolerance: Patient tolerated treatment well Patient left: in chair;with call bell/phone within reach Nurse Communication: Mobility status PT Visit Diagnosis: Other abnormalities of gait and mobility (R26.89)    Time: 5427-0623 PT Time Calculation (min) (ACUTE ONLY): 14 min   Charges:   PT Evaluation $PT Eval Low Complexity: 1 Procedure     PT G CodesCarmelia Bake, PT, DPT 02/11/2017 Pager: [phone]   York Ram E 02/11/2017, 12:08 PM

## 2017-02-11 NOTE — Progress Notes (Signed)
OT Cancellation Note  Patient Details Name: Aveion Nguyen MRN: [id number] DOB: 07/30/1961   Cancelled Treatment:    Reason Eval/Treat Not Completed: OT screened, no needs identified, will sign off. Pt is following THPs and feels comfortable with adls and bathroom transfers.  He was here in March. Will sign off.  Kalie Cabral 02/11/2017, 11:26 AM  Lesle Chris, OTR/L [phone] 02/11/2017

## 2017-02-12 LAB — BASIC METABOLIC PANEL
Anion gap: 6 (ref 5–15)
BUN: 9 mg/dL (ref 6–20)
CHLORIDE: 109 mmol/L (ref 101–111)
CO2: 22 mmol/L (ref 22–32)
CREATININE: 0.6 mg/dL — AB (ref 0.61–1.24)
Calcium: 7.8 mg/dL — ABNORMAL LOW (ref 8.9–10.3)
GFR calc non Af Amer: >60 mL/min (ref >60)
Glucose, Bld: 93 mg/dL (ref 65–99)
Potassium: 3.3 mmol/L — ABNORMAL LOW (ref 3.5–5.1)
Sodium: 137 mmol/L (ref 135–145)

## 2017-02-12 LAB — PREPARE RBC (CROSSMATCH)

## 2017-02-12 LAB — CBC
HEMATOCRIT: 20.8 % — AB (ref 39.0–52.0)
HEMOGLOBIN: 6.9 g/dL — AB (ref 13.0–17.0)
MCH: 30.3 pg (ref 26.0–34.0)
MCHC: 33.2 g/dL (ref 30.0–36.0)
MCV: 91.2 fL (ref 78.0–100.0)
Platelets: 345 10*3/uL (ref 150–400)
RBC: 2.28 MIL/uL — ABNORMAL LOW (ref 4.22–5.81)
RDW: 15.3 % (ref 11.5–15.5)
WBC: 4.4 10*3/uL (ref 4.0–10.5)

## 2017-02-12 MED ORDER — SODIUM CHLORIDE 0.9 % IV SOLN
Freq: Once | INTRAVENOUS | Status: AC
  Administered 2017-02-12: 21:00:00 via INTRAVENOUS

## 2017-02-12 MED ORDER — SODIUM CHLORIDE 0.9 % IV SOLN: Freq: Once | INTRAVENOUS | Status: AC

## 2017-02-12 MED ORDER — SODIUM CHLORIDE 0.9 % IV SOLN: Freq: Once | INTRAVENOUS | Status: DC

## 2017-02-12 NOTE — Progress Notes (Signed)
PHARMACY CONSULT NOTE FOR:  OUTPATIENT  PARENTERAL ANTIBIOTIC THERAPY (OPAT)  Indication: hip infection Regimen: daptomycin 800 mg IV q24h End date: 03/25/17  IV antibiotic discharge orders are pended. To discharging provider:  please sign these orders via discharge navigator,  Select New Orders & click on the button choice - Manage This Unsigned Work.     Thank you for allowing pharmacy to be a part of this patient's care.  Lynelle Doctor 02/12/2017, 5:57 PM

## 2017-02-12 NOTE — Progress Notes (Signed)
PT Cancellation Note  Patient Details Name: [name] MRN: [id number] DOB: 1961/10/02   Cancelled Treatment:    Reason Eval/Treat Not Completed: Medical issues which prohibited therapy Pt with low Hgb and started on PRBCs currently.  Pt has not yet been out of bed today.  Will await 1st unit and check back as schedule permits.   Courtnay Petrilla,KATHrine E 02/12/2017, 3:55 PM Carmelia Bake, PT, DPT 02/12/2017 Pager: [phone]

## 2017-02-12 NOTE — Progress Notes (Addendum)
     Subjective: 2 Days Post-Op Procedure(s) (LRB): Repeat IRRIGATION AND DEBRIDEMENT HIP (Right)   Seen by Dr. Alvan Dame. Patient reports pain as mild, pain controlled. No events throughout the night.  He states that they were not able to draw any fluid off of the left hip.  Receiving two units of blood because of symptomatic anemia.  Objective:   VITALS:   Vitals:   02/11/17 2158 02/12/17 0521  BP: (!) 108/48 (!) 148/69  Pulse: 85 78  Resp: 16 16  Temp: 97.5 F (36.4 C) 97.8 F (36.6 C)    Dorsiflexion/Plantar flexion intact Incision: dressing C/D/I No cellulitis present Compartment soft  LABS  Recent Labs  02/11/17 0508 02/12/17 0413  HGB 8.0* 6.9*  HCT 25.0* 20.8*  WBC 4.3 4.4  PLT 390 345     Recent Labs  02/11/17 0508 02/12/17 0413  NA 134* 137  K 4.0 3.3*  BUN 9 9  CREATININE 0.67 0.60*  GLUCOSE 97 93     Assessment/Plan: 2 Days Post-Op Procedure(s) (LRB): Repeat IRRIGATION AND DEBRIDEMENT HIP (Right) Dr. Alvan Dame to discuss the case with ID with regards to continuing medications / changing. Up with therapy Discharge home with home health RN, antibiotics    ABLA  Transfuse 2 units of blood, will observe Treated with iron and will observe  Obese (BMI 30-39.9) Estimated body mass index is 33.02 kg/m as calculated from the following:   Height as of this encounter: 5' 7.5" (1.715 m).   Weight as of this encounter: 97.1 kg (214 lb). Patient also counseled that weight may inhibit the healing process Patient counseled that losing weight will help with future health issues       West Pugh. Mry Lamia   PAC  02/12/2017, 7:47 AM

## 2017-02-12 NOTE — Progress Notes (Addendum)
Nurse received critical hemoglobin value of 6.9. Nurse assessed patient. Patient was sleeping and was easily awakened. Patient is asymptomatic. Nurse called [name] to have on call provider paged. Nurse is waiting for return call or new orders to be placed.   Amber with [facility] returned call to nurse. Amber gave verbal order to prepare and transfuse 2 units of packed red blood cells. Nurse will enter orders.

## 2017-02-13 LAB — TYPE AND SCREEN
ABO/RH(D): A NEG
Antibody Screen: NEGATIVE
UNIT DIVISION: 0
UNIT DIVISION: 0
Unit division: 0

## 2017-02-13 LAB — BPAM RBC
BLOOD PRODUCT EXPIRATION DATE: 201805182359
Blood Product Expiration Date: 201805092359
Blood Product Expiration Date: 201805202359
ISSUE DATE / TIME: 201805021333
ISSUE DATE / TIME: 201805022342
ISSUE DATE / TIME: 201805021002
UNIT TYPE AND RH: 600
Unit Type and Rh: 600
Unit Type and Rh: 600

## 2017-02-13 LAB — HEMOGLOBIN AND HEMATOCRIT, BLOOD
HEMATOCRIT: 28 % — AB (ref 39.0–52.0)
Hemoglobin: 9.5 g/dL — ABNORMAL LOW (ref 13.0–17.0)

## 2017-02-13 LAB — CK: Total CK: 33 U/L — ABNORMAL LOW (ref 49–397)

## 2017-02-13 MED ORDER — HEPARIN SOD (PORK) LOCK FLUSH 100 UNIT/ML IV SOLN
250.0000 [IU] | INTRAVENOUS | Status: AC | PRN
  Administered 2017-02-13: 250 [IU]

## 2017-02-13 MED ORDER — ASPIRIN 81 MG PO CHEW: 81.0000 mg | CHEWABLE_TABLET | Freq: Two times a day (BID) | ORAL | 0 refills | Status: AC

## 2017-02-13 MED ORDER — DAPTOMYCIN IV (FOR PTA / DISCHARGE USE ONLY)
800.0000 mg | INTRAVENOUS | 0 refills | Status: AC
Start: 2017-02-13 — End: 2017-03-26

## 2017-02-13 MED ORDER — METHOCARBAMOL 500 MG PO TABS: 500.0000 mg | ORAL_TABLET | Freq: Four times a day (QID) | ORAL | 0 refills | Status: DC | PRN

## 2017-02-13 MED ORDER — HYDROCODONE-ACETAMINOPHEN 7.5-325 MG PO TABS: 1.0000 | ORAL_TABLET | ORAL | 0 refills | Status: DC

## 2017-02-13 NOTE — Progress Notes (Signed)
     Subjective: 3 Days Post-Op Procedure(s) (LRB): Repeat IRRIGATION AND DEBRIDEMENT HIP (Right)   Patient reports pain as mild, pain controlled. Feels that he is doing really well.  Happy that he doesn't have swelling in the thigh that he did prior to surgery.  Explained that we are trying to get home health RN set up so he has antibiotics ready for him to take at home so he doesn't have a reoccurrence of the infection in the hip.  Objective:   VITALS:   Vitals:   02/13/17 0229 02/13/17 0559  BP: (!) 143/72 (!) 156/72  Pulse: 67 68  Resp: 16 16  Temp: 98.2 F (36.8 C) 97.7 F (36.5 C)    Dorsiflexion/Plantar flexion intact Incision: dressing C/D/I No cellulitis present Compartment soft  LABS  Recent Labs  02/11/17 0508 02/12/17 0413 02/13/17 0559  HGB 8.0* 6.9* 9.5*  HCT 25.0* 20.8* 28.0*  WBC 4.3 4.4  --   PLT 390 345  --      Recent Labs  02/11/17 0508 02/12/17 0413  NA 134* 137  K 4.0 3.3*  BUN 9 9  CREATININE 0.67 0.60*  GLUCOSE 97 93     Assessment/Plan: 3 Days Post-Op Procedure(s) (LRB): Repeat IRRIGATION AND DEBRIDEMENT HIP (Right) Nurse to change the dressing to an Aquacel dressing Up with therapy Discharge home with home health when ready     West Pugh. Haston Casebolt   PAC  02/13/2017, 8:44 AM

## 2017-02-13 NOTE — Progress Notes (Signed)
Physical Therapy Treatment Patient Details Name: [name] MRN: [id number] DOB: 06-19-1961 Today's Date: 02/13/2017    History of Present Illness 56 year old male s/p repeat R hip I & D.      PT Comments    Pt just finished bathing in recliner on arrival and requesting assist for dressing.  Reminded pt of posterior hip precautions and assisted with lower body dressing.  Pt states his wife can assist him at home.  Pt ambulated in hallway and practiced steps.  Pt reports d/c home later today.   Follow Up Recommendations  Outpatient PT     Equipment Recommendations  None recommended by PT    Recommendations for Other Services       Precautions / Restrictions Precautions Precautions: Posterior Hip Precaution Comments: educated pt to maintain posterior hip precautions Restrictions Weight Bearing Restrictions: No Other Position/Activity Restrictions: WBAT    Mobility  Bed Mobility               General bed mobility comments: pt up in recliner  Transfers Overall transfer level: Needs assistance Equipment used: Rolling walker (2 wheeled) Transfers: Sit to/from Stand Sit to Stand: Supervision         General transfer comment: verbal cues for technique within precautions  Ambulation/Gait Ambulation/Gait assistance: Supervision Ambulation Distance (Feet): 200 Feet Assistive device: Rolling walker (2 wheeled) Gait Pattern/deviations: Decreased stance time - right;Step-through pattern;Trunk flexed     General Gait Details: verbal cues for posture and RW positioning   Stairs Stairs: Yes   Stair Management: Forwards;Step to pattern;Two rails Number of Stairs: 5 General stair comments: 2 up and 3 down, verbal cues for sequence and safety, performed well  Wheelchair Mobility    Modified Rankin (Stroke Patients Only)       Balance                                            Cognition Arousal/Alertness: Awake/alert Behavior During  Therapy: WFL for tasks assessed/performed Overall Cognitive Status: Within Functional Limits for tasks assessed                                        Exercises      General Comments        Pertinent Vitals/Pain Pain Assessment: 0-10 Pain Score: 2  Pain Location: R hip Pain Descriptors / Indicators: Aching;Sore Pain Intervention(s): Limited activity within patient's tolerance;Monitored during session;Repositioned    Home Living                      Prior Function            PT Goals (current goals can now be found in the care plan section) Progress towards PT goals: Progressing toward goals    Frequency    7X/week      PT Plan Current plan remains appropriate    Co-evaluation              AM-PAC PT "6 Clicks" Daily Activity  Outcome Measure  Difficulty turning over in bed (including adjusting bedclothes, sheets and blankets)?: None Difficulty moving from lying on back to sitting on the side of the bed? : A Little Difficulty sitting down on and standing up from a chair with arms (e.g., wheelchair,  bedside commode, etc,.)?: A Little Help needed moving to and from a bed to chair (including a wheelchair)?: A Little Help needed walking in hospital room?: A Little Help needed climbing 3-5 steps with a railing? : A Little 6 Click Score: 19    End of Session   Activity Tolerance: Patient tolerated treatment well Patient left: in chair;with call bell/phone within reach   PT Visit Diagnosis: Other abnormalities of gait and mobility (R26.89)     Time: 7342-8768 PT Time Calculation (min) (ACUTE ONLY): 15 min  Charges:  $Gait Training: 8-22 mins                    G Codes:       Carmelia Bake, PT, DPT 02/13/2017 Pager: [phone]    York Ram E 02/13/2017, 10:46 AM

## 2017-02-13 NOTE — Progress Notes (Signed)
Pt's vitals are WNL, tolerating diet and pain is under control. Discussed discharge instructions with both patient and daughter. Discharged to home with prescriptions and home health was set up.

## 2017-02-14 LAB — BODY FLUID CULTURE
CULTURE: NO GROWTH
Culture: NO GROWTH

## 2017-02-15 LAB — ANAEROBIC CULTURE

## 2017-02-19 NOTE — Discharge Summary (Signed)
Physician Discharge Summary  Patient ID: [name] MRN: [id number] DOB/AGE: 12-30-1960 56 y.o.  Admit date: 02/10/2017 Discharge date: 02/13/2017   Procedures:  Procedure(s) (LRB): Repeat IRRIGATION AND DEBRIDEMENT HIP (Right)  Attending Physician:  Dr. 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 Arthritis   . History of blood transfusion   . Hypertension   . PONV (postoperative nausea and vomiting)   . Sleep apnea    hx of had surgery for sleep apnea    HPI:    Pt is a 56 y.o.malecomplaining of right hippain. Pain had continually increased since the beginning. Pain started on 12/27/2016.  Since that time he has undergone 2 I&D's of the right hip.  He recent returned to the clinic with increasing complaints with regard to the right hip.  Trying to salvage the right hip the plan was discussed with the patient for a repeat I&D.  Various options are discussed with the patient. Risks, benefits and expectations were discussed with the patient. Patient understand the risks, benefits and expectations and wishes to proceed with surgery.   PCP: Patient, No Pcp Per   Discharged Condition: good  Hospital Course:  Patient underwent the above stated procedure on 02/10/2017. Patient tolerated the procedure well and brought to the recovery room in good condition and subsequently to the floor.  POD #1 BP: 141/59 ; Pulse: 70 ; Temp: 97.7 F (36.5 C) ; Resp: 18 Patient reports pain as mild.  Doing ok in bed.  Pain in left hip with activity. Neurovascular intact and incision: dressing C/D/I.  LABS  Basename    HGB     8.0  HCT     25.0   POD #2  BP: 148/69 ; Pulse: 78 ; Temp: 97.87 F (36.6 C) ; Resp: 16 Patient reports pain as mild, pain controlled. No events throughout the night.  He states that they were not able to draw any fluid off of the left hip.   Receiving two units of blood because of symptomatic anemia. Dorsiflexion/plantar flexion intact, incision: dressing C/D/I, no cellulitis present and compartment soft.   LABS  Basename    HGB     6.9  HCT     20.8   POD #3  BP: 156/72 ; Pulse: 68 ; Temp: 97.7 F (36.5 C) ; Resp: 16 Patient reports pain as mild, pain controlled. Feels that he is doing really well.  Happy that he doesn't have swelling in the thigh that he did prior to surgery.  Explained that we are trying to get home health RN set up so he has antibiotics ready for him to take at home so he doesn't have a reoccurrence of the infection in the hip. Dorsiflexion/plantar flexion intact, incision: dressing C/D/I, no cellulitis present and compartment soft.   LABS  Basename    HGB     9.5  HCT     28.0    Discharge Exam: General appearance: alert, cooperative and no distress Extremities: Homans sign is negative, no sign of DVT, no edema, redness or tenderness in the calves or thighs and no ulcers, gangrene or trophic changes  Disposition: Home with follow up in 2 weeks   Follow-up Information    Paralee Cancel, MD. Schedule an appointment as soon as possible for a visit in 2 week(s).   Specialty:  Orthopedic Surgery Contact information:  [address] [place] [address] [phone]        [facility] Follow up.   Why:  assist with IV antibiotics, care of picc line Contact information: [address] [place] [address] [phone]           Discharge Instructions    Call MD / Call 911    Complete by:  As directed    If you experience chest pain or shortness of breath, CALL 911 and be transported to the hospital emergency room.  If you develope a fever above 101 F, pus (white drainage) or increased drainage or redness at the wound, or calf pain, call your surgeon's office.   Constipation Prevention    Complete by:  As directed    Drink plenty of fluids.   Prune juice may be helpful.  You may use a stool softener, such as Colace (over the counter) 100 mg twice a day.  Use MiraLax (over the counter) for constipation as needed.   Diet - low sodium heart healthy    Complete by:  As directed    Discharge instructions    Complete by:  As directed    Maintain surgical dressing until follow up in the clinic. If the edges start to pull up, may reinforce with tape. If the dressing is no longer working, may remove and cover with gauze and tape, but must keep the area dry and clean.  Follow up in 2 weeks at [facility]. Call with any questions or concerns.   Home infusion instructions [facility] May follow [place] Dosing Protocol; May administer Cathflo as needed to maintain patency of vascular access device.; Flushing of vascular access device: per [facility] Protocol: 0.9% NaCl pre/post medica...    Complete by:  As directed    Instructions:  May follow [place] Dosing Protocol   Instructions:  May administer Cathflo as needed to maintain patency of vascular access device.   Instructions:  Flushing of vascular access device: per [facility] Protocol: 0.9% NaCl pre/post medication administration and prn patency; Heparin 100 u/ml, 78m for implanted ports and Heparin 10u/ml, 549mfor all other central venous catheters.   Instructions:  May follow [facility] Anaphylaxis Protocol for First Dose Administration in the home: 0.9% NaCl at 25-50 ml/hr to maintain IV access for protocol meds. Epinephrine 0.3 ml IV/IM PRN and Benadryl 25-50 IV/IM PRN s/s of anaphylaxis.   Instructions:  [facility] Coordinator (RN) to assist per patient IV care needs in the home PRN.   Increase activity slowly as tolerated    Complete by:  As directed    Weight bearing as tolerated with assist device (walker, cane, etc) as directed, use it as long as suggested by your surgeon or therapist, typically at least 4-6 weeks.      Allergies as of 02/13/2017      Reactions     Cephalexin Rash   Ace Inhibitors Rash   Doxycycline Rash      Medication List    STOP taking these medications   HYDROcodone-acetaminophen 5-325 MG tablet Commonly known as:  NORCO/VICODIN Replaced by:  HYDROcodone-acetaminophen 7.5-325 MG tablet   valsartan 320 MG tablet Commonly known as:  DIOVAN     TAKE these medications   amLODipine 2.5 MG tablet Commonly known as:  NORVASC Take 2.5 mg by mouth daily.   aspirin 81 MG chewable tablet Chew 1 tablet (81 mg total) by mouth 2 (two) times daily. Take  for 4 weeks.   carvedilol 12.5 MG tablet Commonly known as:  COREG Take 12.5 mg by mouth 2 (two) times daily.   celecoxib 200 MG capsule Commonly known as:  CELEBREX Take 200 mg by mouth daily. with food   daptomycin IVPB Commonly known as:  CUBICIN Inject 800 mg into the vein daily. Indication:  Hip infection Last Day of Therapy:  03/25/17 Labs - Once weekly:  CBC/D, BMP, and CPK Labs - Every other week:  ESR and CRP What changed:  additional instructions   docusate sodium 100 MG capsule Commonly known as:  COLACE Take 1 capsule (100 mg total) by mouth 2 (two) times daily.   ferrous sulfate 325 (65 FE) MG tablet Take 1 tablet (325 mg total) by mouth 3 (three) times daily after meals. What changed:  when to take this   HYDROcodone-acetaminophen 7.5-325 MG tablet Commonly known as:  NORCO Take 1-2 tablets by mouth every 4 (four) hours. Replaces:  HYDROcodone-acetaminophen 5-325 MG tablet   methocarbamol 500 MG tablet Commonly known as:  ROBAXIN Take 1 tablet (500 mg total) by mouth every 6 (six) hours as needed for muscle spasms.   multivitamin with minerals Tabs tablet Take 1 tablet by mouth daily.   polyethylene glycol packet Commonly known as:  MIRALAX / GLYCOLAX Take 17 g by mouth 2 (two) times daily.   rifampin 300 MG capsule Commonly known as:  RIFADIN Take 1 capsule (300 mg total) by mouth daily.   Vitamin D (Ergocalciferol) 50000 units Caps  capsule Commonly known as:  DRISDOL Take 50,000 Units by mouth every Saturday.            Home Infusion Instuctions        Start     Ordered   02/13/17 0000  Home infusion instructions [facility] May follow [place] Dosing Protocol; May administer Cathflo as needed to maintain patency of vascular access device.; Flushing of vascular access device: per [facility] Protocol: 0.9% NaCl pre/post medica...    Question Answer Comment  Instructions May follow [facility] Dosing Protocol   Instructions May administer Cathflo as needed to maintain patency of vascular access device.   Instructions Flushing of vascular access device: per [facility] Protocol: 0.9% NaCl pre/post medication administration and prn patency; Heparin 100 u/ml, 62m for implanted ports and Heparin 10u/ml, 541mfor all other central venous catheters.   Instructions May follow [facility] Anaphylaxis Protocol for First Dose Administration in the home: 0.9% NaCl at 25-50 ml/hr to maintain IV access for protocol meds. Epinephrine 0.3 ml IV/IM PRN and Benadryl 25-50 IV/IM PRN s/s of anaphylaxis.   Instructions [facility] Infusion Coordinator (RN) to assist per patient IV care needs in the home PRN.      02/13/17 [id number]     Signed: MaWest PughBabish   PA-C  02/19/2017, 8:14 AM

## 2017-02-24 ENCOUNTER — Encounter: Payer: Self-pay | Admitting: Internal Medicine

## 2017-02-24 ENCOUNTER — Ambulatory Visit (INDEPENDENT_AMBULATORY_CARE_PROVIDER_SITE_OTHER): Payer: Worker's Compensation | Admitting: Internal Medicine

## 2017-02-24 VITALS — BP 185/92 | HR 77 | Temp 97.8°F | Ht 67.0 in | Wt 217.0 lb

## 2017-02-24 DIAGNOSIS — T8450XD Infection and inflammatory reaction due to unspecified internal joint prosthesis, subsequent encounter: Secondary | ICD-10-CM | POA: Diagnosis not present

## 2017-02-24 DIAGNOSIS — A4901 Methicillin susceptible Staphylococcus aureus infection, unspecified site: Secondary | ICD-10-CM

## 2017-02-24 MED ORDER — RIFAMPIN 300 MG PO CAPS: 300.0000 mg | ORAL_CAPSULE | Freq: Two times a day (BID) | ORAL | 3 refills | Status: DC

## 2017-02-24 NOTE — Progress Notes (Signed)
RFV: follow up for pji/MSSA bacteremia Patient ID: [name], male   DOB: 12/31/1960, 56 y.o.   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 He started to have increasing pain in late march to his right hip. He presented with sepsis physiology and admitted for MSSA bacteremia with associated right PJI. He underwent I x D with polyethelyne liner exchange and ball exchange on 3/27. It was complicated by hematoma and seromatous fluid within the layers of his wound requiring repeat I x D roughly on POD #3. He was discharged on daptomycin plus rifampin for 6 wk to end on 5/2. He was readmitted on 4/30 for worsening serious drainage to midpoint of his wound concerning for underlying infection. On 4/30, he had ix d with removal of nonviable tissue both superficial and deppt. There was some sermatous fluid with slight thin purulent appearnce to the subdermal laryer but joint aspiration was clear of any organisms, just a few white cells. Decision was made to retreat for another 6 wks until June 12th.   He is here for follow up. Doing well. Still using a crutch for support. No fever, chills, nightsweats, muscle aches, thrush, diarrhea. Did have some blood tinged serous drainage on his dressing from surgery but no further leaking in the last 4 days. The patient was concerned about bloody drainage so he stopped his 162 mg asa.  Outpatient Encounter Prescriptions as of 02/24/2017  Medication Sig  . amLODipine (NORVASC) 2.5 MG tablet Take 2.5 mg by mouth daily.  . carvedilol (COREG) 12.5 MG tablet Take 12.5 mg by mouth 2 (two) times daily.  . celecoxib (CELEBREX) 200 MG capsule Take 200 mg by mouth daily. with food  . daptomycin (CUBICIN) IVPB Inject 800 mg into the vein daily. Indication:  Hip infection Last Day of Therapy:  03/25/17 Labs - Once weekly:   CBC/D, BMP, and CPK Labs - Every other week:  ESR and CRP  . ferrous sulfate 325 (65 FE) MG tablet Take 1 tablet (325 mg total) by mouth 3 (three) times daily after meals. (Patient taking differently: Take 325 mg by mouth 2 (two) times daily. )  . Multiple Vitamin (MULTIVITAMIN WITH MINERALS) TABS tablet Take 1 tablet by mouth daily.  . rifampin (RIFADIN) 300 MG capsule Take 1 capsule (300 mg total) by mouth daily.  . Vitamin D, Ergocalciferol, (DRISDOL) 50000 units CAPS capsule Take 50,000 Units by mouth every Saturday.  . [DISCONTINUED] HYDROcodone-acetaminophen (NORCO) 7.5-325 MG tablet Take 1-2 tablets by mouth every 4 (four) hours.  Marland Kitchen aspirin 81 MG chewable tablet Chew 1 tablet (81 mg total) by mouth 2 (two) times daily. Take for 4 weeks. (Patient not taking: Reported on 02/24/2017)  . HYDROcodone-acetaminophen (NORCO/VICODIN) 5-325 MG tablet Take 1 tablet by mouth every 6 (six) hours as needed. for pain  . methocarbamol (ROBAXIN) 500 MG tablet Take 1 tablet (500 mg total) by mouth every 6 (six) hours as needed for muscle spasms. (Patient not taking: Reported on 02/24/2017)  . valsartan (DIOVAN) 320 MG tablet   . [DISCONTINUED] docusate sodium (COLACE) 100 MG capsule Take 1 capsule (100 mg total) by mouth 2 (two) times daily. (Patient not taking: Reported on 02/04/2017)  . [DISCONTINUED] polyethylene glycol (MIRALAX / GLYCOLAX) packet Take 17 g by mouth 2 (two) times daily. (Patient not taking: Reported on 02/04/2017)  No facility-administered encounter medications on file as of 02/24/2017.      Patient Active Problem List   Diagnosis Date Noted  . 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 Acute hyponatremia 12/31/2016  . Acute renal disease 12/31/2016  . 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 Obese 07/19/2015  . Essential hypertension 11/06/2010     Health Maintenance Due  Topic Date Due  . Hepatitis C Screening  [birth year]  . TETANUS/TDAP   01/21/1978  . COLONOSCOPY  01/21/2009     Review of Systems Review of Systems  Constitutional: Negative for fever, chills, diaphoresis, activity change, appetite change, fatigue and unexpected weight change.  HENT: Negative for congestion, sore throat, rhinorrhea, sneezing, trouble swallowing and sinus pressure.  Eyes: Negative for photophobia and visual disturbance.  Respiratory: Negative for cough, chest tightness, shortness of breath, wheezing and stridor.  Cardiovascular: Negative for chest pain, palpitations and leg swelling.  Gastrointestinal: Negative for nausea, vomiting, abdominal pain, diarrhea, constipation, blood in stool, abdominal distention and anal bleeding.  Genitourinary: Negative for dysuria, hematuria, flank pain and difficulty urinating.  Musculoskeletal: Negative for myalgias, back pain, joint swelling, arthralgias and gait problem.  Skin: Negative for color change, pallor, rash and wound.  Neurological: Negative for dizziness, tremors, weakness and light-headedness.  Hematological: Negative for adenopathy. Does not bruise/bleed easily.  Psychiatric/Behavioral: Negative for behavioral problems, confusion, sleep disturbance, dysphoric mood, decreased concentration and agitation.   Social History  Substance Use Topics  . Smoking status: Former Smoker    Quit date: 10/14/2004  . Smokeless tobacco: Never Used  . Alcohol use No  family hx = no heart disease  Physical Exam   BP (!) 185/92   Pulse 77   Temp 97.8 F (36.6 C) (Oral)   Ht 5' 7"  (1.702 m)   Wt 217 lb (98.4 kg)   BMI 33.99 kg/m   Physical Exam  Constitutional: He is oriented to person, place, and time. He appears well-developed and well-nourished. No distress.  HENT:  Mouth/Throat: Oropharynx is clear and moist. No oropharyngeal exudate.  Cardiovascular: Normal rate, regular rhythm and normal heart sounds. Exam reveals no gallop and no friction rub.  No murmur heard.  Pulmonary/Chest: Effort normal  and breath sounds normal. No respiratory distress. He has no wheezes.  Neurological: He is alert and oriented to person, place, and time.  Skin: Skin is warm and dry. Well healed incision on the hip. No fluctuance or erythema Psychiatric: He has a normal mood and affect. His behavior is normal.      Lab Results  Component Value Date   ESRSEDRATE 118 (H) 01/01/2017   Lab Results  Component Value Date   CRP 29.7 (H) 01/01/2017   Last sed rate from last week is 81 and crp 42 crp   CBC Lab Results  Component Value Date   WBC 4.4 02/12/2017   RBC 2.28 (L) 02/12/2017   HGB 9.5 (L) 02/13/2017   HCT 28.0 (L) 02/13/2017   PLT 345 02/12/2017   MCV 91.2 02/12/2017   MCH 30.3 02/12/2017   MCHC 33.2 02/12/2017   RDW 15.3 02/12/2017   LYMPHSABS 0.7 05/19/2010   MONOABS 0.6 05/19/2010   EOSABS 0.1 05/19/2010    BMET Lab Results  Component Value Date   NA 137 02/12/2017   K 3.3 (L) 02/12/2017   CL 109 02/12/2017   CO2 22 02/12/2017   GLUCOSE 93 02/12/2017   BUN 9 02/12/2017   CREATININE 0.60 (L)  02/12/2017   CALCIUM 7.8 (L) 02/12/2017   GFRNONAA >60 02/12/2017   GFRAA >60 02/12/2017      Assessment and Plan  MSSA bacteremia with right THA PJI s/p I xD, polyethylene liner exchange and ball exchange.=, complicated by poor wound healing ? Infected seroma s/p 2nd I x D now on daptomycin plus rifampin til June 12th.  Continue on iv abtx Increase rif to 32m bid  Drug monitoring = CK appears WNL  Antiplatelet therapy= Only take asa 81 mg daly

## 2017-03-03 ENCOUNTER — Encounter: Payer: Self-pay | Admitting: Infectious Diseases

## 2017-03-10 ENCOUNTER — Encounter: Payer: Self-pay | Admitting: Infectious Diseases

## 2017-03-17 ENCOUNTER — Encounter: Payer: Self-pay | Admitting: Infectious Diseases

## 2017-03-21 NOTE — Addendum Note (Signed)
Addendum  created 03/21/17 1211 by Lyn Hollingshead, MD   Sign clinical note

## 2017-03-24 ENCOUNTER — Encounter: Payer: Self-pay | Admitting: Infectious Diseases

## 2017-03-25 ENCOUNTER — Ambulatory Visit: Payer: Self-pay | Admitting: Internal Medicine

## 2017-04-01 ENCOUNTER — Encounter: Payer: Self-pay | Admitting: Infectious Diseases

## 2017-04-15 ENCOUNTER — Encounter: Payer: Self-pay | Admitting: Internal Medicine

## 2017-04-15 ENCOUNTER — Ambulatory Visit (INDEPENDENT_AMBULATORY_CARE_PROVIDER_SITE_OTHER): Payer: Worker's Compensation | Admitting: Internal Medicine

## 2017-04-15 ENCOUNTER — Encounter: Payer: Self-pay | Admitting: Infectious Diseases

## 2017-04-15 ENCOUNTER — Telehealth: Payer: Self-pay | Admitting: Pharmacist

## 2017-04-15 VITALS — BP 191/92 | HR 61 | Temp 98.6°F | Wt 206.0 lb

## 2017-04-15 DIAGNOSIS — T8450XS Infection and inflammatory reaction due to unspecified internal joint prosthesis, sequela: Secondary | ICD-10-CM | POA: Diagnosis not present

## 2017-04-15 MED ORDER — SULFAMETHOXAZOLE-TRIMETHOPRIM 800-160 MG PO TABS: 1.0000 | ORAL_TABLET | Freq: Two times a day (BID) | ORAL | 5 refills | Status: DC

## 2017-04-15 NOTE — Progress Notes (Signed)
Patient ID: [name], male   DOB: 02-01-1961, 56 y.o.   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 He started to have increasing pain in late march to his right hip. He presented with sepsis physiology and admitted for MSSA bacteremia with associated right PJI. He underwent I x D with polyethelyne liner exchange and ball exchange on 3/27. It was complicated by hematoma and seromatous fluid within the layers of his wound requiring repeat I x D roughly on POD #3. He was discharged on daptomycin plus rifampin for 6 wk to end on 5/2. He was readmitted on 4/30 for worsening serious drainage to midpoint of his wound concerning for underlying infection. On 4/30, he had ix d with removal of nonviable tissue both superficial and deppt. There was some sermatous fluid with slight thin purulent appearnce to the subdermal laryer but joint aspiration was clear of any organisms, just a few white cells. Decision was made to retreat for another course of IV abtx with dapto plus rif til 7/3.( Allergy to cephalosporins)  He is looking forward to stopping his picc line   Lab Results  Component Value Date   ESRSEDRATE 118 (H) 01/01/2017   Lab Results  Component Value Date   CRP 29.7 (H) 01/01/2017    Sed rate on 6/26 was 52 and crp 44.2  Outpatient Encounter Prescriptions as of 04/15/2017  Medication Sig  . amLODipine (NORVASC) 2.5 MG tablet Take 2.5 mg by mouth daily.  . carvedilol (COREG) 12.5 MG tablet Take 12.5 mg by mouth 2 (two) times daily.  . celecoxib (CELEBREX) 200 MG capsule Take 200 mg by mouth daily. with food  . ferrous sulfate 325 (65 FE) MG tablet Take 1 tablet (325 mg total) by mouth 3 (three) times daily after meals. (Patient taking differently: Take 325 mg by mouth 2 (two) times daily. )  . HYDROcodone-acetaminophen  (NORCO/VICODIN) 5-325 MG tablet Take 1 tablet by mouth every 6 (six) hours as needed. for pain  . methocarbamol (ROBAXIN) 500 MG tablet Take 1 tablet (500 mg total) by mouth every 6 (six) hours as needed for muscle spasms.  . Multiple Vitamin (MULTIVITAMIN WITH MINERALS) TABS tablet Take 1 tablet by mouth daily.  . rifampin (RIFADIN) 300 MG capsule Take 1 capsule (300 mg total) by mouth 2 (two) times daily.  . valsartan (DIOVAN) 320 MG tablet   . Vitamin D, Ergocalciferol, (DRISDOL) 50000 units CAPS capsule Take 50,000 Units by mouth every Saturday.   No facility-administered encounter medications on file as of 04/15/2017.      Patient Active Problem List   Diagnosis Date Noted  . 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 Acute hyponatremia 12/31/2016  . Acute renal disease 12/31/2016  . 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 Obese 07/19/2015  . Essential hypertension 11/06/2010     Health Maintenance Due  Topic Date Due  . Hepatitis C Screening  09[birth year]  . TETANUS/TDAP  01/21/1978  . COLONOSCOPY  01/21/2009     Review of Systems  Physical Exam   BP (!) 191/92   Pulse 61   Temp 98.6 F (37 C) (Oral)   Wt 206 lb (93.4 kg)   BMI 32.26 kg/m   Physical Exam  Constitutional: He is oriented to person, place, and time.  He appears well-developed and well-nourished. No distress.  HENT:  Mouth/Throat: Oropharynx is clear and moist. No oropharyngeal exudate.  Cardiovascular: Normal rate, regular rhythm and normal heart sounds. Exam reveals no gallop and no friction rub.  No murmur heard.  Pulmonary/Chest: Effort normal and breath sounds normal. No respiratory distress. He has no wheezes.  Ext: picc line is c/d/i Psychiatric: He has a normal mood and affect. His behavior is normal.    CBC Lab Results  Component Value Date   WBC 4.4 02/12/2017   RBC 2.28 (L) 02/12/2017   HGB 9.5 (L) 02/13/2017   HCT 28.0 (L) 02/13/2017   PLT 345  02/12/2017   MCV 91.2 02/12/2017   MCH 30.3 02/12/2017   MCHC 33.2 02/12/2017   RDW 15.3 02/12/2017   LYMPHSABS 0.7 05/19/2010   MONOABS 0.6 05/19/2010   EOSABS 0.1 05/19/2010    BMET Lab Results  Component Value Date   NA 137 02/12/2017   K 3.3 (L) 02/12/2017   CL 109 02/12/2017   CO2 22 02/12/2017   GLUCOSE 93 02/12/2017   BUN 9 02/12/2017   CREATININE 0.60 (L) 02/12/2017   CALCIUM 7.8 (L) 02/12/2017   GFRNONAA >60 02/12/2017   GFRAA >60 02/12/2017      Assessment and Plan MSSA prosthetic joint infection - will switch to oral bactrim (due to drug alleriges) plus rif to finish 6 month course - wil check labs on weekly basis and have them fax results to [place] - see back in 4-6 wk

## 2017-04-15 NOTE — Patient Instructions (Signed)
Patient instructed to limit use of arm for 1 hour. Patient instructed that the pressure dressing should remain in place for 24 hours. Patient verbalized understanding of these instructions.

## 2017-04-15 NOTE — Progress Notes (Signed)
RN received verbal order to discontinue the patient's PICC line.  Patient identified with name and date of birth. PICC dressing removed, site unremarkable.  PICC line removed using sterile procedure @ 1530. PICC length equal to that noted in patient's hospital chart of 39 cm. Sterile petroleum gauze + sterile 4X4 applied to PICC site, pressure applied for 10 minutes and covered with Medipore tape as a pressure dressing. Patient tolerated procedure without complaints.  Patient instructed to limit use of arm for 1 hour. Patient instructed that the pressure dressing should remain in place for 24 hours. Patient verbalized understanding of these instructions.

## 2017-04-15 NOTE — Telephone Encounter (Addendum)
Ann, RN from [facility], called at patient's home asking if she needed to draw labs today.  She stated patient has a f/u appointment with Dr. Baxter Flattery today at 3pm and was wondering if she needed to draw labs.  I told her yes to draw all labs because Dr. Baxter Flattery will want to see those.

## 2017-05-01 NOTE — Progress Notes (Signed)
ekg 02/07/17 cxr 12/31/16 echo 01/08/17   all in epic

## 2017-05-01 NOTE — Patient Instructions (Signed)
[name]  05/01/2017   Your procedure is scheduled on: 05/12/17  Report to [facility] Main  Entrance .  Report to admitting at     1240PM   Call this number if you have problems the morning of surgery   [phone]   Remember: ONLY 1 PERSON MAY GO WITH YOU TO SHORT STAY TO GET  READY MORNING OF YOUR SURGERY.  Do not eat food  :After Midnight.  YOU MAY HAVE CLEAR LIQUIDS UNTIL 0900 AM THEN NOTHING BY MOUTH     Take these medicines the morning of surgery with A SIP OF WATER:  Carvedilol, Rifampin, Sulfamethoxazole-trimethoprim, amlodipine,hydrocodone if needed                                You may not have any metal on your body including hair pins and              piercings  Do not wear jewelry,, lotions, powders or perfumes, deodorant                    Men may shave face and neck.   Do not bring valuables to the hospital. [place].  Contacts, dentures or bridgework may not be worn into surgery.  Leave suitcase in the car. After surgery it may be brought to your room.                  Please read over the following fact sheets you were given: _____________________________________________________________________   CLEAR LIQUID DIET  Till   900am   then nothing by mouth   Foods Allowed                                                                     Foods Excluded  Coffee and tea, regular and decaf                             liquids that you cannot  Plain Jell-O in any flavor                                             see through such as: Fruit ices (not with fruit pulp)                                     milk, soups, orange juice  Iced Popsicles                                    All solid food Carbonated beverages, regular and diet  Cranberry, grape and apple juices Sports drinks like Gatorade Lightly seasoned clear broth or consume(fat free) Sugar,  honey syrup  Sample Menu Breakfast                                Lunch                                     Supper Cranberry juice                    Beef broth                            Chicken broth Jell-O                                     Grape juice                           Apple juice Coffee or tea                        Jell-O                                      Popsicle                                                Coffee or tea                        Coffee or tea  _____________________________________________________________________              [facility] - Preparing for Surgery Before surgery, you can play an important role.  Because skin is not sterile, your skin needs to be as free of germs as possible.  You can reduce the number of germs on your skin by washing with CHG (chlorahexidine gluconate) soap before surgery.  CHG is an antiseptic cleaner which kills germs and bonds with the skin to continue killing germs even after washing. Please DO NOT use if you have an allergy to CHG or antibacterial soaps.  If your skin becomes reddened/irritated stop using the CHG and inform your nurse when you arrive at Short Stay. Do not shave (including legs and underarms) for at least 48 hours prior to the first CHG shower.  You may shave your face/neck. Please follow these instructions carefully:  1.  Shower with CHG Soap the night before surgery and the  morning of Surgery.  2.  If you choose to wash your hair, wash your hair first as usual with your  normal  shampoo.  3.  After you shampoo, rinse your hair and body thoroughly to remove the  shampoo.                           4.  Use CHG as you would any other liquid soap.  You can apply  chg directly  to the skin and wash                       Gently with a scrungie or clean washcloth.  5.  Apply the CHG Soap to your body ONLY FROM THE NECK DOWN.   Do not use on face/ open                           Wound or open sores. Avoid contact  with eyes, ears mouth and genitals (private parts).                       Wash face,  Genitals (private parts) with your normal soap.             6.  Wash thoroughly, paying special attention to the area where your surgery  will be performed.  7.  Thoroughly rinse your body with warm water from the neck down.  8.  DO NOT shower/wash with your normal soap after using and rinsing off  the CHG Soap.                9.  Pat yourself dry with a clean towel.            10.  Wear clean pajamas.            11.  Place clean sheets on your bed the night of your first shower and do not  sleep with pets. Day of Surgery : Do not apply any lotions/deodorants the morning of surgery.  Please wear clean clothes to the hospital/surgery center.  FAILURE TO FOLLOW THESE INSTRUCTIONS MAY RESULT IN THE CANCELLATION OF YOUR SURGERY PATIENT SIGNATURE_________________________________  NURSE SIGNATURE__________________________________  ________________________________________________________________________  WHAT IS A BLOOD TRANSFUSION? Blood Transfusion Information  A transfusion is the replacement of blood or some of its parts. Blood is made up of multiple cells which provide different functions.  Red blood cells carry oxygen and are used for blood loss replacement.  White blood cells fight against infection.  Platelets control bleeding.  Plasma helps clot blood.  Other blood products are available for specialized needs, such as hemophilia or other clotting disorders. BEFORE THE TRANSFUSION  Who gives blood for transfusions?   Healthy volunteers who are fully evaluated to make sure their blood is safe. This is blood bank blood. Transfusion therapy is the safest it has ever been in the practice of medicine. Before blood is taken from a donor, a complete history is taken to make sure that person has no history of diseases nor engages in risky social behavior (examples are intravenous drug use or sexual  activity with multiple partners). The donor's travel history is screened to minimize risk of transmitting infections, such as malaria. The donated blood is tested for signs of infectious diseases, such as HIV and hepatitis. The blood is then tested to be sure it is compatible with you in order to minimize the chance of a transfusion reaction. If you or a relative donates blood, this is often done in anticipation of surgery and is not appropriate for emergency situations. It takes many days to process the donated blood. RISKS AND COMPLICATIONS Although transfusion therapy is very safe and saves many lives, the main dangers of transfusion include:   Getting an infectious disease.  Developing a transfusion reaction. This is an allergic reaction to something in the blood you were given. Every precaution  is taken to prevent this. The decision to have a blood transfusion has been considered carefully by your caregiver before blood is given. Blood is not given unless the benefits outweigh the risks. AFTER THE TRANSFUSION  Right after receiving a blood transfusion, you will usually feel much better and more energetic. This is especially true if your red blood cells have gotten low (anemic). The transfusion raises the level of the red blood cells which carry oxygen, and this usually causes an energy increase.  The nurse administering the transfusion will monitor you carefully for complications. HOME CARE INSTRUCTIONS  No special instructions are needed after a transfusion. You may find your energy is better. Speak with your caregiver about any limitations on activity for underlying diseases you may have. SEEK MEDICAL CARE IF:   Your condition is not improving after your transfusion.  You develop redness or irritation at the intravenous (IV) site. SEEK IMMEDIATE MEDICAL CARE IF:  Any of the following symptoms occur over the next 12 hours:  Shaking chills.  You have a temperature by mouth above 102 F  (38.9 C), not controlled by medicine.  Chest, back, or muscle pain.  People around you feel you are not acting correctly or are confused.  Shortness of breath or difficulty breathing.  Dizziness and fainting.  You get a rash or develop hives.  You have a decrease in urine output.  Your urine turns a dark color or changes to pink, red, or brown. Any of the following symptoms occur over the next 10 days:  You have a temperature by mouth above 102 F (38.9 C), not controlled by medicine.  Shortness of breath.  Weakness after normal activity.  The white part of the eye turns yellow (jaundice).  You have a decrease in the amount of urine or are urinating less often.  Your urine turns a dark color or changes to pink, red, or brown. Document Released: 09/27/2000 Document Revised: 12/23/2011 Document Reviewed: 05/16/2008 ExitCare Patient Information 2014 [place].  _______________________________________________________________________  Incentive Spirometer  An incentive spirometer is a tool that can help keep your lungs clear and active. This tool measures how well you are filling your lungs with each breath. Taking long deep breaths may help reverse or decrease the chance of developing breathing (pulmonary) problems (especially infection) following:  A long period of time when you are unable to move or be active. BEFORE THE PROCEDURE   If the spirometer includes an indicator to show your best effort, your nurse or respiratory therapist will set it to a desired goal.  If possible, sit up straight or lean slightly forward. Try not to slouch.  Hold the incentive spirometer in an upright position. INSTRUCTIONS FOR USE  1. Sit on the edge of your bed if possible, or sit up as far as you can in bed or on a chair. 2. Hold the incentive spirometer in an upright position. 3. Breathe out normally. 4. Place the mouthpiece in your mouth and seal your lips tightly around  it. 5. Breathe in slowly and as deeply as possible, raising the piston or the ball toward the top of the column. 6. Hold your breath for 3-5 seconds or for as long as possible. Allow the piston or ball to fall to the bottom of the column. 7. Remove the mouthpiece from your mouth and breathe out normally. 8. Rest for a few seconds and repeat Steps 1 through 7 at least 10 times every 1-2 hours when you are awake. Take  your time and take a few normal breaths between deep breaths. 9. The spirometer may include an indicator to show your best effort. Use the indicator as a goal to work toward during each repetition. 10. After each set of 10 deep breaths, practice coughing to be sure your lungs are clear. If you have an incision (the cut made at the time of surgery), support your incision when coughing by placing a pillow or rolled up towels firmly against it. Once you are able to get out of bed, walk around indoors and cough well. You may stop using the incentive spirometer when instructed by your caregiver.  RISKS AND COMPLICATIONS  Take your time so you do not get dizzy or light-headed.  If you are in pain, you may need to take or ask for pain medication before doing incentive spirometry. It is harder to take a deep breath if you are having pain. AFTER USE  Rest and breathe slowly and easily.  It can be helpful to keep track of a log of your progress. Your caregiver can provide you with a simple table to help with this. If you are using the spirometer at home, follow these instructions: [place] IF:   You are having difficultly using the spirometer.  You have trouble using the spirometer as often as instructed.  Your pain medication is not giving enough relief while using the spirometer.  You develop fever of 100.5 F (38.1 C) or higher. SEEK IMMEDIATE MEDICAL CARE IF:   You cough up bloody sputum that had not been present before.  You develop fever of 102 F (38.9 C) or  greater.  You develop worsening pain at or near the incision site. MAKE SURE YOU:   Understand these instructions.  Will watch your condition.  Will get help right away if you are not doing well or get worse. Document Released: 02/10/2007 Document Revised: 12/23/2011 Document Reviewed: 04/13/2007 [facility] Patient Information 2014 [place], [place].   ________________________________________________________________________

## 2017-05-02 ENCOUNTER — Encounter (HOSPITAL_COMMUNITY): Payer: Self-pay

## 2017-05-02 ENCOUNTER — Encounter (HOSPITAL_COMMUNITY)
Admission: RE | Admit: 2017-05-02 | Discharge: 2017-05-02 | Disposition: A | Discharge status: Home / Self Care | Payer: Worker's Compensation | Source: Ambulatory Visit | Attending: Orthopedic Surgery | Admitting: Orthopedic Surgery

## 2017-05-02 DIAGNOSIS — T84090A Other mechanical complication of internal right hip prosthesis, initial encounter: Secondary | ICD-10-CM | POA: Insufficient documentation

## 2017-05-02 DIAGNOSIS — Y838 Other surgical procedures as the cause of abnormal reaction of the patient, or of later complication, without mention of misadventure at the time of the procedure: Secondary | ICD-10-CM | POA: Diagnosis not present

## 2017-05-02 DIAGNOSIS — Z01812 Encounter for preprocedural laboratory examination: Secondary | ICD-10-CM | POA: Diagnosis present

## 2017-05-02 LAB — BASIC METABOLIC PANEL
ANION GAP: 9 (ref 5–15)
BUN: 15 mg/dL (ref 6–20)
CHLORIDE: 98 mmol/L — AB (ref 101–111)
CO2: 23 mmol/L (ref 22–32)
Calcium: 9.1 mg/dL (ref 8.9–10.3)
Creatinine, Ser: 0.69 mg/dL (ref 0.61–1.24)
GFR calc Af Amer: >60 mL/min (ref >60)
GLUCOSE: 94 mg/dL (ref 65–99)
POTASSIUM: 4.2 mmol/L (ref 3.5–5.1)
Sodium: 130 mmol/L — ABNORMAL LOW (ref 135–145)

## 2017-05-02 LAB — CBC
HEMATOCRIT: 33.9 % — AB (ref 39.0–52.0)
HEMOGLOBIN: 11.7 g/dL — AB (ref 13.0–17.0)
MCH: 28.6 pg (ref 26.0–34.0)
MCHC: 34.5 g/dL (ref 30.0–36.0)
MCV: 82.9 fL (ref 78.0–100.0)
Platelets: 305 10*3/uL (ref 150–400)
RBC: 4.09 MIL/uL — AB (ref 4.22–5.81)
RDW: 16.1 % — ABNORMAL HIGH (ref 11.5–15.5)
WBC: 4.2 10*3/uL (ref 4.0–10.5)

## 2017-05-02 LAB — SURGICAL PCR SCREEN
MRSA, PCR: NEGATIVE
Staphylococcus aureus: NEGATIVE

## 2017-05-02 NOTE — Progress Notes (Signed)
BMP routed to Dr. Alvan Dame via epic

## 2017-05-08 NOTE — H&P (Signed)
TOTAL HIP REVISION ADMISSION H&P  Patient is admitted for left revision total hip arthroplasty, posterior approach.  Subjective:  Chief Complaint: Left hip pain S/P THA  HPI: [name], 56 y.o. male, has a history of pain and functional disability in the left hip due to pain s/p THA and patient has failed non-surgical conservative treatments for greater than 12 weeks to include NSAID's and/or analgesics, corticosteriod injections, use of assistive devices and activity modification. The indications for the revision total hip arthroplasty are bearing surface wear leading to  symptomatic synovitis and implant or hip misalignment.  Onset of symptoms was gradual starting 4+ months ago with rapidlly worsening course since that time.  Prior procedures on the left hip include arthroplasty and arthroplasty revision.  Patient currently rates pain in the left hip at 7 out of 10 with activity.  There is night pain, worsening of pain with activity and weight bearing, trendelenberg gait, pain that interfers with activities of daily living and pain with passive range of motion. Patient has evidence of failure of the THA by imaging studies.  This condition presents safety issues increasing the risk of falls.   There is no current active infection.   Risks, benefits and expectations were discussed with the patient.  Risks including but not limited to the risk of anesthesia, blood clots, nerve damage, blood vessel damage, failure of the prosthesis, infection and up to and including death.  Patient understand the risks, benefits and expectations and wishes to proceed with surgery.   PCP: System, Pcp Not In  D/C Plans:       Home   Post-op Meds:       No Rx given  Tranexamic Acid:      To be given - IV   Decadron:      Is to be given  FYI:     ASA  [place]  DME:   Pt already has equipment  PT:   No PT    Patient Active Problem List   Diagnosis Date Noted  . 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 Acute  hyponatremia 12/31/2016  . Acute renal disease 12/31/2016  . 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 Obese 07/19/2015  . Essential hypertension 11/06/2010   Past Medical History:  Diagnosis Date  . Arthritis   . History of blood transfusion   . Hypertension   . PONV (postoperative nausea and vomiting)   . Sleep apnea    hx of had surgery for sleep apnea    Past Surgical History:  Procedure Laterality Date  . ANTERIOR HIP REVISION Left 07/17/2015   Procedure: LEFT POSTERIOR  HIP REVISION;  Surgeon: Paralee Cancel, MD;  Location: [facility]S;  Service: Orthopedics;  Laterality: Left;  . BACK SURGERY     four back surgeries (3 fusions)  . INCISION AND DRAINAGE HIP Right 01/01/2017   Procedure: IRRIGATION AND DEBRIDEMENT RIGHT TOTAL HIP WITH HEAD AND LINER EXCHANGE AND PLACEMENT OF STIMULIN BEADS;  Surgeon: Paralee Cancel, MD;  Location: [facility] OR[facility];  Service: Orthopedics;  Laterality: Right;  . INCISION AND DRAINAGE HIP Right 01/07/2017   Procedure: IRRIGATION AND DEBRIDEMENT HIP;  Surgeon: Paralee Cancel, MD;  Location: [facility];  Service: Orthopedics;  Laterality: Right;  . INCISION AND DRAINAGE HIP Right 02/10/2017   Procedure: Repeat IRRIGATION AND DEBRIDEMENT HIP;  Surgeon: Paralee Cancel, MD;  Location: [facility] ORS;  Service: Orthopedics;  Laterality: Right;  . JOINT REPLACEMENT     6 surgeries  on right hip, 1 on left hip   including I&D Right hip  . PICC LINE PLACE PERIPHERAL ([place] HX)     present at preop appt. 02/07/17 right arm  . right knee surgery - torn maniscus    . TEE WITHOUT CARDIOVERSION N/A 01/08/2017   Procedure: TRANSESOPHAGEAL ECHOCARDIOGRAM (TEE);  Surgeon: Josue Hector, MD;  Location: [facility];  Service: Cardiovascular;  Laterality: N/A;  . TONSILLECTOMY     adnoids removed  . TOTAL HIP REVISION Right 05/27/2016   Procedure: TOTAL HIP REVISION;  Surgeon: Paralee Cancel, MD;  Location: [facility];  Service: Orthopedics;  Laterality: Right;    No  prescriptions prior to admission.   Allergies  Allergen Reactions  . Cephalexin Rash  . Ace Inhibitors Rash  . Doxycycline Rash    Social History  Substance Use Topics  . Smoking status: Former Smoker    Quit date: 10/14/2004  . Smokeless tobacco: Never Used  . Alcohol use No    Family History  Problem Relation Age of Onset  . COPD Mother   . Alzheimer's disease Father       Review of Systems  Constitutional: Negative.   HENT: Negative.   Eyes: Negative.   Respiratory: Negative.   Cardiovascular: Negative.   Gastrointestinal: Positive for constipation.  Genitourinary: Negative.   Musculoskeletal: Positive for joint pain.  Skin: Negative.   Neurological: Negative.   Endo/Heme/Allergies: Negative.   Psychiatric/Behavioral: Negative.     Objective:  Physical Exam  Constitutional: He is oriented to person, place, and time. He appears well-developed.  HENT:  Head: Normocephalic.  Eyes: Pupils are equal, round, and reactive to light.  Neck: Neck supple. No JVD present. No tracheal deviation present. No thyromegaly present.  Cardiovascular: Normal rate, regular rhythm and intact distal pulses.   Respiratory: Effort normal and breath sounds normal. No respiratory distress. He has no wheezes.  GI: Soft. There is no tenderness. There is no guarding.  Musculoskeletal:       Left hip: He exhibits decreased range of motion, decreased strength, tenderness and bony tenderness. He exhibits no swelling and no deformity.  Lymphadenopathy:    He has no cervical adenopathy.  Neurological: He is alert and oriented to person, place, and time.  Skin: Skin is warm and dry.  Psychiatric: He has a normal mood and affect.      Labs:  Estimated body mass index is 31.64 kg/m as calculated from the following:   Height as of 05/02/17: 5\' 7"  (1.702 m).   Weight as of 05/02/17: 91.6 kg (202 lb).  Imaging Review:  Plain radiographs demonstrate previous THA of the left hip(s). There is  evidence of loosening.The bone quality appears to be good for age and reported activity level.  Assessment/Plan:  Left hip with failed previous arthroplasty.  The patient history, physical examination, clinical judgement of the provider and imaging studies are consistent with failure of the left hip(s), previous total hip arthroplasty. Revision total hip arthroplasty is deemed medically necessary. The treatment options including medical management, injection therapy, arthroscopy and arthroplasty were discussed at length. The risks and benefits of total hip arthroplasty were presented and reviewed. The risks due to aseptic loosening, infection, stiffness, dislocation/subluxation,  thromboembolic complications and other imponderables were discussed.  The patient acknowledged the explanation, agreed to proceed with the plan and consent was signed. Patient is being admitted for inpatient treatment for surgery, pain control, PT, OT, prophylactic antibiotics, VTE prophylaxis, progressive ambulation and ADL's and discharge planning. The  patient is planning to be discharged home.     West Pugh Hyun Reali   PA-C  05/08/2017, 1:07 PM

## 2017-05-12 ENCOUNTER — Inpatient Hospital Stay (HOSPITAL_COMMUNITY): Payer: Worker's Compensation | Admitting: Anesthesiology

## 2017-05-12 ENCOUNTER — Encounter (HOSPITAL_COMMUNITY)
Admission: RE | Disposition: A | Discharge status: Home Health | Payer: Self-pay | Source: Ambulatory Visit | Attending: Orthopedic Surgery

## 2017-05-12 ENCOUNTER — Encounter (HOSPITAL_COMMUNITY): Payer: Self-pay | Admitting: *Deleted

## 2017-05-12 ENCOUNTER — Inpatient Hospital Stay (HOSPITAL_COMMUNITY): Payer: Worker's Compensation

## 2017-05-12 ENCOUNTER — Inpatient Hospital Stay (HOSPITAL_COMMUNITY)
Admission: RE | Admit: 2017-05-12 | Discharge: 2017-05-14 | DRG: 468 | Disposition: A | Discharge status: Home Health | Payer: Worker's Compensation | Source: Ambulatory Visit | Attending: Orthopedic Surgery | Admitting: Orthopedic Surgery

## 2017-05-12 DIAGNOSIS — Z96649 Presence of unspecified artificial hip joint: Secondary | ICD-10-CM | POA: Diagnosis not present

## 2017-05-12 DIAGNOSIS — Z888 Allergy status to other drugs, medicaments and biological substances status: Secondary | ICD-10-CM

## 2017-05-12 DIAGNOSIS — I10 Essential (primary) hypertension: Secondary | ICD-10-CM | POA: Diagnosis present

## 2017-05-12 DIAGNOSIS — Z87891 Personal history of nicotine dependence: Secondary | ICD-10-CM | POA: Diagnosis not present

## 2017-05-12 DIAGNOSIS — T84031A Mechanical loosening of internal left hip prosthetic joint, initial encounter: Principal | ICD-10-CM | POA: Diagnosis present

## 2017-05-12 DIAGNOSIS — Z881 Allergy status to other antibiotic agents status: Secondary | ICD-10-CM | POA: Diagnosis not present

## 2017-05-12 DIAGNOSIS — Z6831 Body mass index (BMI) 31.0-31.9, adult: Secondary | ICD-10-CM | POA: Diagnosis not present

## 2017-05-12 DIAGNOSIS — E669 Obesity, unspecified: Secondary | ICD-10-CM | POA: Diagnosis present

## 2017-05-12 DIAGNOSIS — Z8619 Personal history of other infectious and parasitic diseases: Secondary | ICD-10-CM

## 2017-05-12 DIAGNOSIS — T8450XA Infection and inflammatory reaction due to unspecified internal joint prosthesis, initial encounter: Secondary | ICD-10-CM | POA: Diagnosis not present

## 2017-05-12 DIAGNOSIS — Z8614 Personal history of Methicillin resistant Staphylococcus aureus infection: Secondary | ICD-10-CM | POA: Diagnosis not present

## 2017-05-12 HISTORY — PX: TOTAL HIP REVISION: SHX763

## 2017-05-12 LAB — TYPE AND SCREEN
ABO/RH(D): A NEG
ANTIBODY SCREEN: NEGATIVE

## 2017-05-12 SURGERY — TOTAL HIP REVISION
Anesthesia: General | Site: Hip | Laterality: Left

## 2017-05-12 MED ORDER — LIDOCAINE HCL (CARDIAC) 20 MG/ML IV SOLN
INTRAVENOUS | Status: DC | PRN
  Administered 2017-05-12: 100 mg via INTRAVENOUS

## 2017-05-12 MED ORDER — FERROUS SULFATE 325 (65 FE) MG PO TABS
325.0000 mg | ORAL_TABLET | Freq: Three times a day (TID) | ORAL | Status: DC
  Administered 2017-05-13 – 2017-05-14 (×2): 325 mg via ORAL
  Filled 2017-05-12 (×2): qty 1

## 2017-05-12 MED ORDER — ALUM & MAG HYDROXIDE-SIMETH 200-200-20 MG/5ML PO SUSP: 15.0000 mL | ORAL | Status: DC | PRN

## 2017-05-12 MED ORDER — FENTANYL CITRATE (PF) 250 MCG/5ML IJ SOLN
INTRAMUSCULAR | Status: AC
  Filled 2017-05-12: qty 5

## 2017-05-12 MED ORDER — SCOPOLAMINE 1 MG/3DAYS TD PT72
MEDICATED_PATCH | TRANSDERMAL | Status: DC | PRN
  Administered 2017-05-12: 1 via TRANSDERMAL

## 2017-05-12 MED ORDER — DIPHENHYDRAMINE HCL 25 MG PO CAPS: 25.0000 mg | ORAL_CAPSULE | Freq: Four times a day (QID) | ORAL | Status: DC | PRN

## 2017-05-12 MED ORDER — FENTANYL CITRATE (PF) 100 MCG/2ML IJ SOLN
25.0000 ug | INTRAMUSCULAR | Status: DC | PRN
  Administered 2017-05-13: 50 ug via INTRAVENOUS
  Filled 2017-05-12: qty 2

## 2017-05-12 MED ORDER — DEXAMETHASONE SODIUM PHOSPHATE 10 MG/ML IJ SOLN
10.0000 mg | Freq: Once | INTRAMUSCULAR | Status: AC
  Administered 2017-05-12: 10 mg via INTRAVENOUS

## 2017-05-12 MED ORDER — VANCOMYCIN HCL 1000 MG IV SOLR
INTRAVENOUS | Status: AC
  Filled 2017-05-12: qty 1000

## 2017-05-12 MED ORDER — MENTHOL 3 MG MT LOZG: 1.0000 | LOZENGE | OROMUCOSAL | Status: DC | PRN

## 2017-05-12 MED ORDER — DEXAMETHASONE SODIUM PHOSPHATE 10 MG/ML IJ SOLN
INTRAMUSCULAR | Status: AC
  Filled 2017-05-12: qty 1

## 2017-05-12 MED ORDER — BISACODYL 10 MG RE SUPP: 10.0000 mg | Freq: Every day | RECTAL | Status: DC | PRN

## 2017-05-12 MED ORDER — ONDANSETRON HCL 4 MG/2ML IJ SOLN: 4.0000 mg | Freq: Four times a day (QID) | INTRAMUSCULAR | Status: DC | PRN

## 2017-05-12 MED ORDER — DOCUSATE SODIUM 100 MG PO CAPS
100.0000 mg | ORAL_CAPSULE | Freq: Two times a day (BID) | ORAL | Status: DC
  Administered 2017-05-12 – 2017-05-14 (×4): 100 mg via ORAL
  Filled 2017-05-12 (×4): qty 1

## 2017-05-12 MED ORDER — EPHEDRINE SULFATE 50 MG/ML IJ SOLN
INTRAMUSCULAR | Status: DC | PRN
  Administered 2017-05-12: 10 mg via INTRAVENOUS

## 2017-05-12 MED ORDER — METOCLOPRAMIDE HCL 5 MG PO TABS: 5.0000 mg | ORAL_TABLET | Freq: Three times a day (TID) | ORAL | Status: DC | PRN

## 2017-05-12 MED ORDER — ONDANSETRON HCL 4 MG/2ML IJ SOLN
INTRAMUSCULAR | Status: DC | PRN
  Administered 2017-05-12: 4 mg via INTRAVENOUS

## 2017-05-12 MED ORDER — ALBUMIN HUMAN 5 % IV SOLN
INTRAVENOUS | Status: DC | PRN
  Administered 2017-05-12: 19:00:00 via INTRAVENOUS

## 2017-05-12 MED ORDER — MEPERIDINE HCL 50 MG/ML IJ SOLN
6.2500 mg | INTRAMUSCULAR | Status: DC | PRN
Start: 2017-05-12 — End: 2017-05-12

## 2017-05-12 MED ORDER — ASPIRIN 81 MG PO CHEW
81.0000 mg | CHEWABLE_TABLET | Freq: Two times a day (BID) | ORAL | Status: DC
  Administered 2017-05-12 – 2017-05-14 (×4): 81 mg via ORAL
  Filled 2017-05-12 (×4): qty 1

## 2017-05-12 MED ORDER — METHOCARBAMOL 500 MG PO TABS
500.0000 mg | ORAL_TABLET | Freq: Four times a day (QID) | ORAL | Status: DC | PRN
  Administered 2017-05-13: 500 mg via ORAL
  Filled 2017-05-12: qty 1

## 2017-05-12 MED ORDER — IRBESARTAN 150 MG PO TABS
150.0000 mg | ORAL_TABLET | Freq: Every day | ORAL | Status: DC
Start: 2017-05-13 — End: 2017-05-14
  Filled 2017-05-12 (×2): qty 1

## 2017-05-12 MED ORDER — FENTANYL CITRATE (PF) 100 MCG/2ML IJ SOLN
INTRAMUSCULAR | Status: AC
  Filled 2017-05-12: qty 2

## 2017-05-12 MED ORDER — VANCOMYCIN HCL IN DEXTROSE 1-5 GM/200ML-% IV SOLN
INTRAVENOUS | Status: AC
  Administered 2017-05-12: 1000 mg via INTRAVENOUS
  Filled 2017-05-12: qty 200

## 2017-05-12 MED ORDER — PHENOL 1.4 % MT LIQD: 1.0000 | OROMUCOSAL | Status: DC | PRN

## 2017-05-12 MED ORDER — METOCLOPRAMIDE HCL 5 MG/ML IJ SOLN: 5.0000 mg | Freq: Three times a day (TID) | INTRAMUSCULAR | Status: DC | PRN

## 2017-05-12 MED ORDER — PROPOFOL 10 MG/ML IV BOLUS
INTRAVENOUS | Status: AC
  Filled 2017-05-12: qty 20

## 2017-05-12 MED ORDER — ROCURONIUM BROMIDE 100 MG/10ML IV SOLN
INTRAVENOUS | Status: DC | PRN
  Administered 2017-05-12: 50 mg via INTRAVENOUS
  Administered 2017-05-12: 20 mg via INTRAVENOUS

## 2017-05-12 MED ORDER — DEXAMETHASONE SODIUM PHOSPHATE 10 MG/ML IJ SOLN
10.0000 mg | Freq: Once | INTRAMUSCULAR | Status: AC
  Administered 2017-05-13: 10 mg via INTRAVENOUS
  Filled 2017-05-12: qty 1

## 2017-05-12 MED ORDER — EPHEDRINE 5 MG/ML INJ
INTRAVENOUS | Status: AC
  Filled 2017-05-12: qty 10

## 2017-05-12 MED ORDER — TRANEXAMIC ACID 1000 MG/10ML IV SOLN
1000.0000 mg | Freq: Once | INTRAVENOUS | Status: AC
  Administered 2017-05-12: 1000 mg via INTRAVENOUS
  Filled 2017-05-12: qty 1100

## 2017-05-12 MED ORDER — CHLORHEXIDINE GLUCONATE 4 % EX LIQD: 60.0000 mL | Freq: Once | CUTANEOUS | Status: DC

## 2017-05-12 MED ORDER — SUGAMMADEX SODIUM 200 MG/2ML IV SOLN
INTRAVENOUS | Status: DC | PRN
  Administered 2017-05-12: 200 mg via INTRAVENOUS

## 2017-05-12 MED ORDER — SODIUM CHLORIDE 0.9 % IR SOLN
Status: DC | PRN
  Administered 2017-05-12: 4000 mL

## 2017-05-12 MED ORDER — LACTATED RINGERS IV SOLN: INTRAVENOUS | Status: DC

## 2017-05-12 MED ORDER — METHOCARBAMOL 1000 MG/10ML IJ SOLN
500.0000 mg | Freq: Four times a day (QID) | INTRAVENOUS | Status: DC | PRN
  Administered 2017-05-12: 500 mg via INTRAVENOUS
  Filled 2017-05-12: qty 5

## 2017-05-12 MED ORDER — PHENYLEPHRINE HCL 10 MG/ML IJ SOLN
INTRAMUSCULAR | Status: DC | PRN
  Administered 2017-05-12 (×3): 80 ug via INTRAVENOUS

## 2017-05-12 MED ORDER — SODIUM CHLORIDE 0.9 % IV SOLN
INTRAVENOUS | Status: DC
  Administered 2017-05-12 – 2017-05-13 (×2): via INTRAVENOUS

## 2017-05-12 MED ORDER — VANCOMYCIN HCL IN DEXTROSE 1-5 GM/200ML-% IV SOLN
1000.0000 mg | INTRAVENOUS | Status: AC
  Administered 2017-05-12: 1000 mg via INTRAVENOUS

## 2017-05-12 MED ORDER — SODIUM CHLORIDE 0.9 % IV SOLN
1000.0000 mg | INTRAVENOUS | Status: DC
  Filled 2017-05-12: qty 10

## 2017-05-12 MED ORDER — METOCLOPRAMIDE HCL 5 MG/ML IJ SOLN: 10.0000 mg | Freq: Once | INTRAMUSCULAR | Status: DC | PRN

## 2017-05-12 MED ORDER — ALBUMIN HUMAN 5 % IV SOLN
INTRAVENOUS | Status: AC
  Filled 2017-05-12: qty 250

## 2017-05-12 MED ORDER — ROCURONIUM BROMIDE 50 MG/5ML IV SOSY
PREFILLED_SYRINGE | INTRAVENOUS | Status: AC
  Filled 2017-05-12: qty 5

## 2017-05-12 MED ORDER — LACTATED RINGERS IV SOLN
INTRAVENOUS | Status: DC
  Administered 2017-05-12 (×3): via INTRAVENOUS

## 2017-05-12 MED ORDER — VANCOMYCIN HCL 10 G IV SOLR
1500.0000 mg | Freq: Two times a day (BID) | INTRAVENOUS | Status: DC
  Administered 2017-05-13: 1500 mg via INTRAVENOUS
  Filled 2017-05-12 (×2): qty 1500

## 2017-05-12 MED ORDER — ONDANSETRON HCL 4 MG PO TABS: 4.0000 mg | ORAL_TABLET | Freq: Four times a day (QID) | ORAL | Status: DC | PRN

## 2017-05-12 MED ORDER — LIDOCAINE 2% (20 MG/ML) 5 ML SYRINGE
INTRAMUSCULAR | Status: AC
  Filled 2017-05-12: qty 5

## 2017-05-12 MED ORDER — SULFAMETHOXAZOLE-TRIMETHOPRIM 800-160 MG PO TABS
1.0000 | ORAL_TABLET | Freq: Two times a day (BID) | ORAL | Status: DC
  Administered 2017-05-12 – 2017-05-13 (×2): 1 via ORAL
  Filled 2017-05-12 (×2): qty 1

## 2017-05-12 MED ORDER — CARVEDILOL 12.5 MG PO TABS
12.5000 mg | ORAL_TABLET | Freq: Two times a day (BID) | ORAL | Status: DC
  Administered 2017-05-13 – 2017-05-14 (×3): 12.5 mg via ORAL
  Filled 2017-05-12 (×3): qty 1

## 2017-05-12 MED ORDER — MIDAZOLAM HCL 5 MG/5ML IJ SOLN
INTRAMUSCULAR | Status: DC | PRN
  Administered 2017-05-12: 2 mg via INTRAVENOUS

## 2017-05-12 MED ORDER — MAGNESIUM CITRATE PO SOLN: 1.0000 | Freq: Once | ORAL | Status: DC | PRN

## 2017-05-12 MED ORDER — FENTANYL CITRATE (PF) 100 MCG/2ML IJ SOLN
25.0000 ug | INTRAMUSCULAR | Status: DC | PRN
  Administered 2017-05-12 (×3): 50 ug via INTRAVENOUS

## 2017-05-12 MED ORDER — PROPOFOL 10 MG/ML IV BOLUS
INTRAVENOUS | Status: DC | PRN
  Administered 2017-05-12: 150 mg via INTRAVENOUS

## 2017-05-12 MED ORDER — HYDROCODONE-ACETAMINOPHEN 7.5-325 MG PO TABS
1.0000 | ORAL_TABLET | ORAL | Status: DC
  Administered 2017-05-12 (×2): 1 via ORAL
  Administered 2017-05-13 – 2017-05-14 (×10): 2 via ORAL
  Filled 2017-05-12 (×7): qty 2
  Filled 2017-05-12: qty 1
  Filled 2017-05-12 (×2): qty 2
  Filled 2017-05-12: qty 1
  Filled 2017-05-12: qty 2

## 2017-05-12 MED ORDER — FENTANYL CITRATE (PF) 100 MCG/2ML IJ SOLN
INTRAMUSCULAR | Status: DC | PRN
  Administered 2017-05-12: 100 ug via INTRAVENOUS
  Administered 2017-05-12 (×3): 50 ug via INTRAVENOUS

## 2017-05-12 MED ORDER — RIFAMPIN 300 MG PO CAPS
300.0000 mg | ORAL_CAPSULE | Freq: Two times a day (BID) | ORAL | Status: DC
  Administered 2017-05-12 – 2017-05-14 (×4): 300 mg via ORAL
  Filled 2017-05-12 (×4): qty 1

## 2017-05-12 MED ORDER — SCOPOLAMINE 1 MG/3DAYS TD PT72
MEDICATED_PATCH | TRANSDERMAL | Status: AC
  Filled 2017-05-12: qty 1

## 2017-05-12 MED ORDER — ONDANSETRON HCL 4 MG/2ML IJ SOLN
INTRAMUSCULAR | Status: AC
  Filled 2017-05-12: qty 2

## 2017-05-12 MED ORDER — CELECOXIB 200 MG PO CAPS
200.0000 mg | ORAL_CAPSULE | Freq: Two times a day (BID) | ORAL | Status: DC
  Administered 2017-05-12 – 2017-05-14 (×4): 200 mg via ORAL
  Filled 2017-05-12 (×4): qty 1

## 2017-05-12 MED ORDER — VANCOMYCIN HCL 1000 MG IV SOLR
INTRAVENOUS | Status: DC | PRN
  Administered 2017-05-12: 1000 mg

## 2017-05-12 MED ORDER — AMLODIPINE BESYLATE 5 MG PO TABS
2.5000 mg | ORAL_TABLET | Freq: Every day | ORAL | Status: DC
  Administered 2017-05-13: 2.5 mg via ORAL
  Filled 2017-05-12 (×2): qty 1

## 2017-05-12 MED ORDER — POLYETHYLENE GLYCOL 3350 17 G PO PACK
17.0000 g | PACK | Freq: Two times a day (BID) | ORAL | Status: DC
  Administered 2017-05-12 – 2017-05-13 (×2): 17 g via ORAL
  Filled 2017-05-12 (×4): qty 1

## 2017-05-12 MED ORDER — MIDAZOLAM HCL 2 MG/2ML IJ SOLN
INTRAMUSCULAR | Status: AC
  Filled 2017-05-12: qty 2

## 2017-05-12 MED ORDER — PHENYLEPHRINE HCL 10 MG/ML IJ SOLN
INTRAVENOUS | Status: DC | PRN
  Administered 2017-05-12: 20 ug/min via INTRAVENOUS

## 2017-05-12 SURGICAL SUPPLY — 69 items
BAG DECANTER FOR FLEXI CONT (MISCELLANEOUS) ×2 IMPLANT
BAG ZIPLOCK 12X15 (MISCELLANEOUS) ×2 IMPLANT
BLADE SAW SGTL 11.0X1.19X90.0M (BLADE) IMPLANT
BLADE SAW SGTL 18X1.27X75 (BLADE) ×2 IMPLANT
BRUSH FEMORAL CANAL (MISCELLANEOUS) IMPLANT
COVER SURGICAL LIGHT HANDLE (MISCELLANEOUS) ×2 IMPLANT
CUP PINNACLE REV 60MM (Orthopedic Implant) ×2 IMPLANT
DERMABOND ADVANCED (GAUZE/BANDAGES/DRESSINGS) ×1
DERMABOND ADVANCED .7 DNX12 (GAUZE/BANDAGES/DRESSINGS) ×1 IMPLANT
DRAPE ORTHO SPLIT 77X108 STRL (DRAPES) ×2
DRAPE POUCH INSTRU U-SHP 10X18 (DRAPES) ×2 IMPLANT
DRAPE SURG 17X11 SM STRL (DRAPES) ×2 IMPLANT
DRAPE SURG ORHT 6 SPLT 77X108 (DRAPES) ×2 IMPLANT
DRAPE U-SHAPE 47X51 STRL (DRAPES) ×2 IMPLANT
DRESSING AQUACEL AG SP 3.5X10 (GAUZE/BANDAGES/DRESSINGS) IMPLANT
DRSG AQUACEL AG ADV 3.5X10 (GAUZE/BANDAGES/DRESSINGS) IMPLANT
DRSG AQUACEL AG ADV 3.5X14 (GAUZE/BANDAGES/DRESSINGS) ×2 IMPLANT
DRSG AQUACEL AG SP 3.5X10 (GAUZE/BANDAGES/DRESSINGS)
DURAPREP 26ML APPLICATOR (WOUND CARE) ×2 IMPLANT
ELECT BLADE TIP CTD 4 INCH (ELECTRODE) ×2 IMPLANT
ELECT REM PT RETURN 15FT ADLT (MISCELLANEOUS) ×2 IMPLANT
FACESHIELD WRAPAROUND (MASK) ×8 IMPLANT
GAUZE SPONGE 2X2 8PLY STRL LF (GAUZE/BANDAGES/DRESSINGS) ×1 IMPLANT
GLOVE BIOGEL M 7.0 STRL (GLOVE) IMPLANT
GLOVE BIOGEL PI IND STRL 7.5 (GLOVE) ×1 IMPLANT
GLOVE BIOGEL PI IND STRL 8.5 (GLOVE) ×1 IMPLANT
GLOVE BIOGEL PI INDICATOR 7.5 (GLOVE) ×1
GLOVE BIOGEL PI INDICATOR 8.5 (GLOVE) ×1
GLOVE ECLIPSE 8.0 STRL XLNG CF (GLOVE) ×4 IMPLANT
GOWN STRL REUS W/TWL LRG LVL3 (GOWN DISPOSABLE) ×2 IMPLANT
GOWN STRL REUS W/TWL XL LVL3 (GOWN DISPOSABLE) ×4 IMPLANT
GRAFT IC CHAMBER LG (Bone Implant) ×2 IMPLANT
HANDPIECE INTERPULSE COAX TIP (DISPOSABLE) ×1
HEAD FEMORAL 36MM HIP (Head) ×2 IMPLANT
LINER NEUTRAL 52X36X54 PLUS 4 (Liner) ×2 IMPLANT
MANIFOLD NEPTUNE II (INSTRUMENTS) ×2 IMPLANT
MARKER SKIN DUAL TIP RULER LAB (MISCELLANEOUS) ×2 IMPLANT
NDL SAFETY ECLIPSE 18X1.5 (NEEDLE) ×1 IMPLANT
NEEDLE HYPO 18GX1.5 SHARP (NEEDLE) ×1
NS IRRIG 1000ML POUR BTL (IV SOLUTION) ×2 IMPLANT
PADDING CAST COTTON 6X4 STRL (CAST SUPPLIES) ×2 IMPLANT
POSITIONER SURGICAL ARM (MISCELLANEOUS) ×2 IMPLANT
PRESSURIZER FEMORAL UNIV (MISCELLANEOUS) IMPLANT
SCREW 6.5MMX30MM (Screw) ×2 IMPLANT
SCREW BN 35X5XPERI TPR HD (Screw) ×2 IMPLANT
SCREW BONE 5.0X35 (Screw) ×2 IMPLANT
SCREW PINN CAN 6.5X20 (Screw) ×2 IMPLANT
SCREW TPR HD 5.0MM DIA 60 (Screw) ×2 IMPLANT
SET HNDPC FAN SPRY TIP SCT (DISPOSABLE) ×1 IMPLANT
SLEEVE FEM 40MM HIP (Sleeve) ×2 IMPLANT
SPONGE GAUZE 2X2 STER 10/PKG (GAUZE/BANDAGES/DRESSINGS) ×1
SPONGE LAP 18X18 X RAY DECT (DISPOSABLE) ×2 IMPLANT
SPONGE LAP 4X18 X RAY DECT (DISPOSABLE) ×2 IMPLANT
STAPLER VISISTAT 35W (STAPLE) ×2 IMPLANT
SUCTION FRAZIER HANDLE 10FR (MISCELLANEOUS) ×1
SUCTION TUBE FRAZIER 10FR DISP (MISCELLANEOUS) ×1 IMPLANT
SUT MNCRL AB 4-0 PS2 18 (SUTURE) ×2 IMPLANT
SUT VIC AB 1 CT1 36 (SUTURE) ×4 IMPLANT
SUT VIC AB 2-0 CT1 27 (SUTURE) ×3
SUT VIC AB 2-0 CT1 TAPERPNT 27 (SUTURE) ×3 IMPLANT
SUT VLOC 180 0 24IN GS25 (SUTURE) ×4 IMPLANT
SWAB COLLECTION DEVICE MRSA (MISCELLANEOUS) ×2 IMPLANT
TOWEL OR 17X26 10 PK STRL BLUE (TOWEL DISPOSABLE) ×4 IMPLANT
TOWER CARTRIDGE SMART MIX (DISPOSABLE) IMPLANT
TRAY FOLEY W/METER SILVER 16FR (SET/KITS/TRAYS/PACK) ×2 IMPLANT
TUBE KAMVAC SUCTION (TUBING) IMPLANT
WATER STERILE IRR 1000ML POUR (IV SOLUTION) ×4 IMPLANT
WATER STERILE IRR 1500ML POUR (IV SOLUTION) ×2 IMPLANT
YANKAUER SUCT BULB TIP 10FT TU (MISCELLANEOUS) ×2 IMPLANT

## 2017-05-12 NOTE — Anesthesia Preprocedure Evaluation (Addendum)
Anesthesia Evaluation  Patient identified by MRN, date of birth, ID band Patient awake    Reviewed: Allergy & Precautions, NPO status , Patient's Chart, lab work & pertinent test results  History of Anesthesia Complications (+) PONV  Airway Mallampati: II  TM Distance: >3 FB Neck ROM: Full    Dental no notable dental hx.    Pulmonary former smoker,    Pulmonary exam normal breath sounds clear to auscultation       Cardiovascular hypertension, Pt. on medications negative cardio ROS Normal cardiovascular exam Rhythm:Regular Rate:Normal     Neuro/Psych negative neurological ROS  negative psych ROS   GI/Hepatic negative GI ROS, Neg liver ROS,   Endo/Other  negative endocrine ROS  Renal/GU negative Renal ROS  negative genitourinary   Musculoskeletal negative musculoskeletal ROS (+)   Abdominal   Peds negative pediatric ROS (+)  Hematology negative hematology ROS (+)   Anesthesia Other Findings   Reproductive/Obstetrics negative OB ROS                             Anesthesia Physical Anesthesia Plan  ASA: II  Anesthesia Plan: General   Post-op Pain Management:    Induction: Intravenous  PONV Risk Score and Plan: 4 or greater and Ondansetron, Dexamethasone, Midazolam and Scopolamine patch - Pre-op  Airway Management Planned: Oral ETT  Additional Equipment:   Intra-op Plan:   Post-operative Plan: Extubation in OR  Informed Consent: I have reviewed the patients History and Physical, chart, labs and discussed the procedure including the risks, benefits and alternatives for the proposed anesthesia with the patient or authorized representative who has indicated his/her understanding and acceptance.   Dental advisory given  Plan Discussed with: CRNA  Anesthesia Plan Comments:         Anesthesia Quick Evaluation

## 2017-05-12 NOTE — Interval H&P Note (Signed)
History and Physical Interval Note:  05/12/2017 3:52 PM  [name]  has presented today for surgery, with the diagnosis of Failed Left total hip arthroplasty  The various methods of treatment have been discussed with the patient and family. After consideration of risks, benefits and other options for treatment, the patient has consented to  Procedure(s) with comments: LEFT TOTAL HIP REVISION POSTERIOR (Left) - 90 mins as a surgical intervention .  The patient's history has been reviewed, patient examined, no change in status, stable for surgery.  I have reviewed the patient's chart and labs.  Questions were answered to the patient's satisfaction.     [name]

## 2017-05-12 NOTE — Progress Notes (Signed)
Pharmacy Antibiotic Note  [name] is a 56 y.o. male admitted on 05/12/2017 for L THA revision, hx of previous joint infections .  Pharmacy has been consulted for Vancomycin dosing.  Plan: Vancomycin 1gm pre-op, then 1500mg  IV every 12 hours.  Goal trough 15-20 mcg/mL.  Height: 5\' 7"  (170.2 cm) Weight: 202 lb (91.6 kg) IBW/kg (Calculated) : 66.1  Temp (24hrs), Avg:98.1 F (36.7 C), Min:97.7 F (36.5 C), Max:98.7 F (37.1 C)  No results for input(s): WBC, CREATININE, LATICACIDVEN, VANCOTROUGH, VANCOPEAK, VANCORANDOM, GENTTROUGH, GENTPEAK, GENTRANDOM, TOBRATROUGH, TOBRAPEAK, TOBRARND, AMIKACINPEAK, AMIKACINTROU, AMIKACIN in the last 168 hours.  Estimated Creatinine Clearance: 108.6 mL/min (by C-G formula based on SCr of 0.69 mg/dL).    Allergies  Allergen Reactions  . Cephalexin Rash  . Ace Inhibitors Rash  . Doxycycline Rash   Antimicrobials this admission: 7/30 Vancomycin >>  7/30 Rifampin po >>   Dose adjustments this admission:  Microbiology results: None ordered  Thank you for allowing pharmacy to be a part of this patient's care.  Minda Ditto PharmD Pager [phone] 05/12/2017, 9:24 PM

## 2017-05-12 NOTE — Transfer of Care (Signed)
Immediate Anesthesia Transfer of Care Note  Patient: [name]  Procedure(s) Performed: Procedure(s) with comments: LEFT TOTAL HIP REVISION POSTERIOR (Left) - 90 mins  Patient Location: PACU  Anesthesia Type:General  Level of Consciousness: awake, alert  and oriented  Airway & Oxygen Therapy: Patient Spontanous Breathing and Patient connected to face mask oxygen  Post-op Assessment: Report given to RN and Post -op Vital signs reviewed and stable  Post vital signs: Reviewed and stable  Last Vitals:  Vitals:   05/12/17 1321  BP: (!) 163/82  Pulse: 77  Resp: 16  Temp: 36.7 C    Last Pain:  Vitals:   05/12/17 1349  TempSrc:   PainSc: 4          Complications: No apparent anesthesia complications

## 2017-05-12 NOTE — Brief Op Note (Signed)
05/12/2017  4:23 PM  PATIENT:  [name]  56 y.o. male  PRE-OPERATIVE DIAGNOSIS:  Failed left total hip arthroplasty  POST-OPERATIVE DIAGNOSIS:  Failed left total hip arthroplasty  PROCEDURE:  Procedure(s) with comments: LEFT TOTAL HIP REVISION POSTERIOR (Left) - 90 mins  SURGEON:  Surgeon(s) and Role:    Paralee Cancel, MD - Primary  PHYSICIAN ASSISTANT: Danae Orleans, PA-C  ANESTHESIA:   general  EBL:  Total I/O In: 0  Out: 550 [Urine:550]  850 cc  BLOOD ADMINISTERED:none  DRAINS: none   LOCAL MEDICATIONS USED:  NONE  SPECIMEN:  No Specimen  DISPOSITION OF SPECIMEN:  N/A  COUNTS:  YES  TOURNIQUET:  * No tourniquets in log *  DICTATION: .Other Dictation: Dictation Number [id number]  PLAN OF CARE: Admit to inpatient   PATIENT DISPOSITION:  PACU - hemodynamically stable.   Delay start of Pharmacological VTE agent (>24hrs) due to surgical blood loss or risk of bleeding: no

## 2017-05-12 NOTE — Anesthesia Procedure Notes (Signed)
Procedure Name: Intubation Date/Time: 05/12/2017 4:58 PM Performed by: Glory Buff Pre-anesthesia Checklist: Patient identified, Emergency Drugs available, Suction available and Patient being monitored Patient Re-evaluated:Patient Re-evaluated prior to induction Oxygen Delivery Method: Circle system utilized Preoxygenation: Pre-oxygenation with 100% oxygen Induction Type: IV induction Ventilation: Mask ventilation without difficulty Laryngoscope Size: Miller and 3 Grade View: Grade II Tube type: Oral Tube size: 7.5 mm Number of attempts: 1 Airway Equipment and Method: Stylet and Oral airway Placement Confirmation: ETT inserted through vocal cords under direct vision,  positive ETCO2 and breath sounds checked- equal and bilateral Secured at: 21 cm Tube secured with: Tape Dental Injury: Teeth and Oropharynx as per pre-operative assessment

## 2017-05-13 ENCOUNTER — Encounter (HOSPITAL_COMMUNITY): Payer: Self-pay | Admitting: Orthopedic Surgery

## 2017-05-13 DIAGNOSIS — Z96649 Presence of unspecified artificial hip joint: Secondary | ICD-10-CM

## 2017-05-13 DIAGNOSIS — T8450XA Infection and inflammatory reaction due to unspecified internal joint prosthesis, initial encounter: Secondary | ICD-10-CM

## 2017-05-13 DIAGNOSIS — Z8614 Personal history of Methicillin resistant Staphylococcus aureus infection: Secondary | ICD-10-CM

## 2017-05-13 LAB — BASIC METABOLIC PANEL
ANION GAP: 7 (ref 5–15)
BUN: 6 mg/dL (ref 6–20)
CHLORIDE: 99 mmol/L — AB (ref 101–111)
CO2: 24 mmol/L (ref 22–32)
Calcium: 8.5 mg/dL — ABNORMAL LOW (ref 8.9–10.3)
Creatinine, Ser: 0.65 mg/dL (ref 0.61–1.24)
GFR calc Af Amer: >60 mL/min (ref >60)
GFR calc non Af Amer: >60 mL/min (ref >60)
GLUCOSE: 126 mg/dL — AB (ref 65–99)
POTASSIUM: 4.3 mmol/L (ref 3.5–5.1)
Sodium: 130 mmol/L — ABNORMAL LOW (ref 135–145)

## 2017-05-13 LAB — CBC
HEMATOCRIT: 27.4 % — AB (ref 39.0–52.0)
HEMOGLOBIN: 9.4 g/dL — AB (ref 13.0–17.0)
MCH: 28.5 pg (ref 26.0–34.0)
MCHC: 34.3 g/dL (ref 30.0–36.0)
MCV: 83 fL (ref 78.0–100.0)
PLATELETS: 257 10*3/uL (ref 150–400)
RBC: 3.3 MIL/uL — AB (ref 4.22–5.81)
RDW: 16.1 % — ABNORMAL HIGH (ref 11.5–15.5)
WBC: 4.7 10*3/uL (ref 4.0–10.5)

## 2017-05-13 MED ORDER — DAPTOMYCIN 500 MG IV SOLR
800.0000 mg | INTRAVENOUS | Status: DC
  Administered 2017-05-13 – 2017-05-14 (×2): 800 mg via INTRAVENOUS
  Filled 2017-05-13 (×2): qty 16

## 2017-05-13 MED ORDER — DAPTOMYCIN IV (FOR PTA / DISCHARGE USE ONLY): 800.0000 mg | INTRAVENOUS | 0 refills | Status: AC

## 2017-05-13 MED ORDER — SODIUM CHLORIDE 0.9% FLUSH
10.0000 mL | INTRAVENOUS | Status: DC | PRN
  Administered 2017-05-14 (×2): 10 mL
  Filled 2017-05-13 (×2): qty 40

## 2017-05-13 NOTE — Progress Notes (Signed)
Orders received from ortho to transition from vancomycin to daptomycin.  Previous daptomycin dosage was 800 mg (~8mg /kg) q24h .      Patient has been treated previously by the [facility] for Infectious Disease.  I have text-paged Dr. Baxter Flattery and requested guidance for OPAT plans.  Clayburn Pert, PharmD, BCPS Pager: [phone] 05/13/2017  1:16 PM

## 2017-05-13 NOTE — Progress Notes (Signed)
[facility]  New pt with [facility] this admission though pt has been on our service in the recent past.  [facility] will provide [facility] services for Home IV ABX upon DC.  Pt is workers comp and [facility] team is working with [employer] manufacturing systems for [employer] for home IV ABX/HHRN.   If patient discharges after hours, please call [phone].   Larry Sierras 05/13/2017, 2:59 PM

## 2017-05-13 NOTE — Progress Notes (Signed)
Patient ID: [name], male   DOB: [birth year], 56 y.o.   MRN: [id number] Subjective: 1 Day Post-Op Procedure(s) (LRB): LEFT TOTAL HIP REVISION POSTERIOR (Left)    Patient reports pain as mild to moderate.  Comfortable.  Reviewed intra-operative findings and plans.  No events  Objective:   VITALS:   Vitals:   05/13/17 1029 05/13/17 1316  BP: (!) 114/51 (!) 95/45  Pulse: 71 65  Resp: 16 15  Temp: 98 F (36.7 C) 97.8 F (36.6 C)    Neurovascular intact Incision: dressing C/D/I  LABS  Recent Labs  05/13/17 0534  HGB 9.4*  HCT 27.4*  WBC 4.7  PLT 257     Recent Labs  05/13/17 0534  NA 130*  K 4.3  BUN 6  CREATININE 0.65  GLUCOSE 126*    No results for input(s): LABPT, INR in the last 72 hours.   Assessment/Plan: 1 Day Post-Op Procedure(s) (LRB): LEFT TOTAL HIP REVISION POSTERIOR (Left)   Advance diet Up with therapy Continue ABX therapy due to Culture taken of surgical site during surgery  Though workup in recent past was confounded by acute right total hip infection an attempt at aspirating the joint was unsuccessful.  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  In OR once I entered the joint space there was a blush of inflammatory based synovial fluid - cultures were taken.  Base on history of right hip, based on intra-operative findings, based on challenges co-morbidities with regards to fully removing all his components I proceeded with a radical debridement, irrigation and revision of failed acetabulum.  With the goal being joint revision/salvage.  Rationale reviewed with Mr. Shenberger this am  [facility] line tomorrow.  Will change to Daptomycin as he has previously been treated with this for his right hip. Probable D/D tomorrow if all arrangements can be made

## 2017-05-13 NOTE — Progress Notes (Signed)
Pharmacy Antibiotic Note  [name] is a 56 y.o. male admitted on 05/12/2017 with loosening of L THA, underwent revision on 05/12/17. 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  He is allergic to cephalosporins and therefore previously received treatment with daptomycin plus rifampin.  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   Orders received from infectious disease MD for daptomycin dosing per pharmacy at 8 mg/kg q24h along with rifampin 300 mg PO q12h.  [facility] records show patient has typically received daptomycin 800 mg q24h.   Plan: Daptomycin 800 mg IV q24h (~ 8 mg/kg, rounded up to next 100 mg increment) Continue rifampin 300 mg PO q12h CK tomorrow AM Planning 6 weeks of therapy per recommendation from Dr. Baxter Flattery, last day 06/24/17 OPAT discharge orders including outpatient lab orders are pended.  Height: 5\' 7"  (170.2 cm) Weight: 202 lb (91.6 kg) IBW/kg (Calculated) : 66.1  Temp (24hrs), Avg:98.1 F (36.7 C), Min:97.4 F (36.3 C), Max:98.7 F (37.1 C)   Recent Labs Lab 05/13/17 0534  WBC 4.7  CREATININE 0.65    Estimated Creatinine Clearance: 108.6 mL/min (by C-G formula based on SCr of 0.65 mg/dL).    Allergies  Allergen Reactions  . Cephalexin Rash  . Ace Inhibitors Rash  . Doxycycline Rash    Microbiology results: 7/30: L hip (OR culture): collected    Thank you for allowing pharmacy to be a part of this patient's care.   Clayburn Pert, PharmD, BCPS Pager: [phone] 05/13/2017  3:40 PM

## 2017-05-13 NOTE — Progress Notes (Signed)
Spoke with patient at bedside. He states he will have a PICC placed in am and plan is to d/c home with IV antibiotics. Contacted by his case worker with workers [employer] [phone]. She states they have used [facility] in the past for IV antibiotics. She requested orders to be faxed to her at [phone] when ready. Contacted [facility] to make them aware, they are familiar with the patient. Will continue to follow and update as needed.

## 2017-05-13 NOTE — Progress Notes (Signed)
Physical Therapy Treatment Patient Details Name: [name] MRN: [id number] DOB: [birth year] Today's Date: 05/13/2017    History of Present Illness Pt is a 56 year old male s/p left revision total hip arthroplasty, posterior approach    PT Comments    POD # 1 pm session After PICC placement then after pain meds.  Assisted OOB using a belt to self assist L LE off bed with increased time and HOB elevated nearly 90 degrees.  Pt aware of his THP and PWBing.  Assisted with amb in hallway then back to bed and applied ICE.    Follow Up Recommendations  No PT follow up     Equipment Recommendations  None recommended by PT    Recommendations for Other Services       Precautions / Restrictions Precautions Precautions: Posterior Hip Precaution Comments: pt recalled 3/3 THP Restrictions Weight Bearing Restrictions: Yes LLE Weight Bearing: Partial weight bearing LLE Partial Weight Bearing Percentage or Pounds: 50% Other Position/Activity Restrictions: pt awarw he is 50% WBing    Mobility  Bed Mobility Overal bed mobility: Needs Assistance Bed Mobility: Supine to Sit;Sit to Supine     Supine to sit: Min assist Sit to supine: Min assist   General bed mobility comments: assist for L LE and increased time  Transfers Overall transfer level: Needs assistance Equipment used: Rolling walker (2 wheeled) Transfers: Sit to/from Stand Sit to Stand: Min guard;Min assist         General transfer comment: VC's to avoid hip flex > 90 degrees and increased time.    Ambulation/Gait Ambulation/Gait assistance: Min guard Ambulation Distance (Feet): 48 Feet Assistive device: Rolling walker (2 wheeled) Gait Pattern/deviations: Step-to pattern;Decreased stance time - left;Antalgic Gait velocity: decreased   General Gait Details: verbal cues for sequence and PWB status   Stairs            Wheelchair Mobility    Modified Rankin (Stroke Patients Only)       Balance                                            Cognition Arousal/Alertness: Awake/alert Behavior During Therapy: WFL for tasks assessed/performed Overall Cognitive Status: Within Functional Limits for tasks assessed                                        Exercises      General Comments        Pertinent Vitals/Pain Pain Assessment: 0-10 Pain Score: 8  Pain Location: L hip Pain Descriptors / Indicators: Aching;Sore;Operative site guarding Pain Intervention(s): Monitored during session;Repositioned;Premedicated before session;Ice applied    Home Living Family/patient expects to be discharged to:: Private residence Living Arrangements: Spouse/significant other Available Help at Discharge: Family;Available 24 hours/day Type of Home: House Home Access: Stairs to enter   Home Layout: One level Home Equipment: Gilford Rile - 2 wheels;Crutches;Bedside commode      Prior Function Level of Independence: Independent with assistive device(s)      Comments: using crutches   PT Goals (current goals can now be found in the care plan section) Acute Rehab PT Goals PT Goal Formulation: With patient Time For Goal Achievement: 05/16/17 Potential to Achieve Goals: Good Progress towards PT goals: Progressing toward goals    Frequency  7X/week      PT Plan Current plan remains appropriate    Co-evaluation              AM-PAC PT "6 Clicks" Daily Activity  Outcome Measure  Difficulty turning over in bed (including adjusting bedclothes, sheets and blankets)?: A Lot Difficulty moving from lying on back to sitting on the side of the bed? : A Lot Difficulty sitting down on and standing up from a chair with arms (e.g., wheelchair, bedside commode, etc,.)?: A Lot Help needed moving to and from a bed to chair (including a wheelchair)?: A Little Help needed walking in hospital room?: A Little Help needed climbing 3-5 steps with a railing? : A Lot 6 Click Score:  14    End of Session Equipment Utilized During Treatment: Gait belt Activity Tolerance: Patient limited by fatigue;Patient limited by pain Patient left: in bed;with call bell/phone within reach Nurse Communication: Mobility status PT Visit Diagnosis: Unsteadiness on feet (R26.81)     Time: 1542-1600 PT Time Calculation (min) (ACUTE ONLY): 18 min  Charges:  $Gait Training: 8-22 mins                    G Codes:       {Kyvon Hu  PTA [facility]  Acute  Rehab Pager      [phone]

## 2017-05-13 NOTE — Progress Notes (Signed)
Peripherally Inserted Central Catheter/Midline Placement  The IV Nurse has discussed with the patient and/or persons authorized to consent for the patient, the purpose of this procedure and the potential benefits and risks involved with this procedure.  The benefits include less needle sticks, lab draws from the catheter, and the patient may be discharged home with the catheter. Risks include, but not limited to, infection, bleeding, blood clot (thrombus formation), and puncture of an artery; nerve damage and irregular heartbeat and possibility to perform a PICC exchange if needed/ordered by physician.  Alternatives to this procedure were also discussed.  Bard Power PICC patient education guide, fact sheet on infection prevention and patient information card has been provided to patient /or left at bedside.    PICC/Midline Placement Documentation  PICC Single Lumen 01/07/17 PICC Right Brachial 39 cm 0 cm (Active)     PICC Single Lumen 05/13/17 PICC Right Brachial 39 cm 0 cm (Active)  Indication for Insertion or Continuance of Line Home intravenous therapies (PICC only) 05/13/2017  2:45 PM  Exposed Catheter (cm) 0 cm 05/13/2017  2:45 PM  Site Assessment Clean;Dry;Intact 05/13/2017  2:45 PM  Line Status Flushed;Saline locked;Blood return noted 05/13/2017  2:45 PM  Dressing Type Transparent;Securing device 05/13/2017  2:45 PM  Dressing Status Clean;Dry;Intact;Antimicrobial disc in place 05/13/2017  2:45 PM  Dressing Change Due 05/20/17 05/13/2017  2:45 PM       Frances Maywood 05/13/2017, 2:48 PM

## 2017-05-13 NOTE — Evaluation (Signed)
Physical Therapy Evaluation Patient Details Name: [name] MRN: [id number] DOB: 18-Dec-1960 Today's Date: 05/13/2017   History of Present Illness  Pt is a 56 year old male s/p left revision total hip arthroplasty, posterior approach  Clinical Impression  Patient is s/p above surgery resulting in functional limitations due to the deficits listed below (see PT Problem List).  Patient will benefit from skilled PT to increase their independence and safety with mobility to allow discharge to the venue listed below.  Pt able to verbalize posterior hip precautions from previous hip surgeries.  Pt educated on PWB status.  Pt assisted with ambulating short distance in hallway POD #1 and fatigued quickly.     Follow Up Recommendations No PT follow up    Equipment Recommendations  None recommended by PT    Recommendations for Other Services       Precautions / Restrictions Precautions Precautions: Posterior Hip Precaution Comments: pt able to verbalize all posterior hip precautions Restrictions Weight Bearing Restrictions: Yes LLE Weight Bearing: Partial weight bearing LLE Partial Weight Bearing Percentage or Pounds: 50      Mobility  Bed Mobility Overal bed mobility: Needs Assistance Bed Mobility: Supine to Sit     Supine to sit: Min assist     General bed mobility comments: assist for L LE  Transfers Overall transfer level: Needs assistance Equipment used: Rolling walker (2 wheeled) Transfers: Sit to/from Stand Sit to Stand: Min guard         General transfer comment: verbal cues for UE and LE positioning, PWB status  Ambulation/Gait Ambulation/Gait assistance: Min guard Ambulation Distance (Feet): 40 Feet Assistive device: Rolling walker (2 wheeled) Gait Pattern/deviations: Step-to pattern;Decreased stance time - left;Antalgic     General Gait Details: verbal cues for sequence and PWB status, distance limited by fatigue  Stairs            Wheelchair  Mobility    Modified Rankin (Stroke Patients Only)       Balance                                             Pertinent Vitals/Pain Pain Assessment: 0-10 Pain Score: 4  Pain Location: L hip Pain Descriptors / Indicators: Aching;Sore Pain Intervention(s): Premedicated before session;Limited activity within patient's tolerance;Repositioned;Monitored during session;Ice applied    Home Living Family/patient expects to be discharged to:: Private residence Living Arrangements: Spouse/significant other Available Help at Discharge: Family;Available 24 hours/day Type of Home: House Home Access: Stairs to enter   [employer] of Steps: 1 Home Layout: One level Home Equipment: Walker - 2 wheels;Crutches;Bedside commode      Prior Function Level of Independence: Independent with assistive device(s)         Comments: using crutches     Hand Dominance        Extremity/Trunk Assessment        Lower Extremity Assessment Lower Extremity Assessment: LLE deficits/detail LLE Deficits / Details: anticipated L hip weakness, pt requiring assist       Communication   Communication: No difficulties  Cognition Arousal/Alertness: Awake/alert Behavior During Therapy: WFL for tasks assessed/performed Overall Cognitive Status: Within Functional Limits for tasks assessed  General Comments      Exercises     Assessment/Plan    PT Assessment Patient needs continued PT services  PT Problem List Decreased strength;Decreased mobility;Decreased knowledge of use of DME;Pain;Decreased knowledge of precautions;Decreased activity tolerance       PT Treatment Interventions Functional mobility training;Stair training;Gait training;Therapeutic activities;DME instruction;Therapeutic exercise;Patient/family education    PT Goals (Current goals can be found in the Care Plan section)  Acute Rehab PT  Goals PT Goal Formulation: With patient Time For Goal Achievement: 05/16/17 Potential to Achieve Goals: Good    Frequency 7X/week   Barriers to discharge        Co-evaluation               AM-PAC PT "6 Clicks" Daily Activity  Outcome Measure Difficulty turning over in bed (including adjusting bedclothes, sheets and blankets)?: A Little Difficulty moving from lying on back to sitting on the side of the bed? : Total Difficulty sitting down on and standing up from a chair with arms (e.g., wheelchair, bedside commode, etc,.)?: A Little Help needed moving to and from a bed to chair (including a wheelchair)?: A Little Help needed walking in hospital room?: A Little Help needed climbing 3-5 steps with a railing? : A Little 6 Click Score: 16    End of Session Equipment Utilized During Treatment: Gait belt Activity Tolerance: Patient limited by fatigue Patient left: in chair;with call bell/phone within reach   PT Visit Diagnosis: Other abnormalities of gait and mobility (R26.89)    Time: 6294-7654 PT Time Calculation (min) (ACUTE ONLY): 13 min   Charges:   PT Evaluation $PT Eval Low Complexity: 1 Low     PT G CodesCarmelia Bake, PT, DPT 05/13/2017 Pager: [phone]   York Ram E 05/13/2017, 12:52 PM

## 2017-05-13 NOTE — Progress Notes (Signed)
PHARMACY CONSULT NOTE FOR DAPTOMYCIN  OUTPATIENT  PARENTERAL ANTIBIOTIC THERAPY (OPAT)  Indication: prosthetic joint infection Regimen: daptomycin 800 mg (~8 mg/kg) IV q24h End date: 06/24/2017  IV antibiotic discharge orders are pended. To discharging provider:  please sign these orders via discharge navigator,  Select New Orders & click on the button choice - Manage This Unsigned Work.     Thank you for allowing pharmacy to be a part of this patient's care.   Clayburn Pert, PharmD, BCPS Pager: [phone] 05/13/2017  3:43 PM

## 2017-05-13 NOTE — Consult Note (Signed)
[name]  Total days of antibiotics 2        Day 2 rif/bactrim               Reason for Consult: THA pJI    Referring Physician: olin  Principal Problem:   S/P left TH revision    HPI: [name] is a 56 y.o. 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 He started to have increasing pain in late march to his right hip. He presented with sepsis physiology and admitted for MSSA bacteremia with associated right PJI. He underwent I x D with polyethelyne liner exchange and ball exchange on 3/27. It was complicated by hematoma and seromatous fluid within the layers of his wound requiring repeat I x D roughly on POD #3. He was discharged on daptomycin plus rifampin for 6 wk to end on 5/2. He was readmitted on 4/30 for worsening serious drainage to midpoint of his wound concerning for underlying infection. On 4/30, he had ix d with removal of nonviable tissue both superficial and deep.  Decision was made to retreat for another course of IV abtx with dapto plus rif til 7/3.He was converted to bactrim plus rifampin for plan of 6 months but he started to have progressive on going leg hip pain and radiographic evidence of HW loosening. thus, admitted for left revision total hip arthroplasty, posterior approach on 7/26.deep cultures were sent that did not show any organisms but gram stain showed numerous WBC. OR report suggested inflammation in the joint but unclear if it had purulence.he was restarted on daptomycin and rifampin on 7/31.  Past Medical History:  Diagnosis Date  . Arthritis   . History of blood transfusion   . Hypertension   . PONV (postoperative nausea and vomiting)   . Sleep apnea    hx of had surgery for sleep apnea    Allergies:  Allergies  Allergen Reactions  . Cephalexin Rash  . Ace Inhibitors Rash  . Doxycycline Rash     MEDICATIONS: . amLODipine  2.5 mg Oral Daily  . aspirin  81 mg Oral BID  . carvedilol  12.5 mg Oral BID WC  . celecoxib  200 mg Oral Q12H  . dexamethasone  10 mg Intravenous Once  . docusate sodium  100 mg Oral BID  . ferrous sulfate  325 mg Oral TID PC  . HYDROcodone-acetaminophen  1-2 tablet Oral Q4H  . irbesartan  150 mg Oral Daily  . polyethylene glycol  17 g Oral BID  . rifampin  300 mg Oral BID  . sulfamethoxazole-trimethoprim  1 tablet Oral BID    Social History  Substance Use Topics  . Smoking status: Former Smoker    Quit date: 10/14/2004  . Smokeless tobacco: Never Used  . Alcohol use No    Family History  Problem Relation Age of Onset  . COPD Mother   . Alzheimer's disease Father      Review of Systems  Constitutional: Negative for fever, chills, diaphoresis, activity change, appetite change, fatigue and unexpected weight change.  HENT: Negative for congestion, sore throat, rhinorrhea, sneezing, trouble swallowing and sinus pressure.  Eyes: Negative for photophobia and visual disturbance.  Respiratory: Negative for cough, chest tightness, shortness of breath, wheezing and stridor.  Cardiovascular: Negative for chest pain, palpitations and leg swelling.  Gastrointestinal: Negative for nausea, vomiting, abdominal pain,  diarrhea, constipation, blood in stool, abdominal distention and anal bleeding.  Genitourinary: Negative for dysuria, hematuria, flank pain and difficulty urinating.  Musculoskeletal: + left hip pain Skin: Negative for color change, pallor, rash and wound.  Neurological: Negative for dizziness, tremors, weakness and light-headedness.  Hematological: Negative for adenopathy. Does not bruise/bleed easily.  Psychiatric/Behavioral: Negative for behavioral problems, confusion, sleep disturbance, dysphoric mood, decreased concentration and agitation.     OBJECTIVE: Temp:  [97.4 F (36.3 C)-98.7 F (37.1 C)] 97.8 F (36.6 C) (07/31 1316) Pulse  Rate:  [63-84] 65 (07/31 1316) Resp:  [8-17] 15 (07/31 1316) BP: (95-163)/(45-76) 95/45 (07/31 1316) SpO2:  [96 %-100 %] 100 % (07/31 1316) Physical Exam  Constitutional: He is oriented to person, place, and time. He appears well-developed and well-nourished. No distress.  HENT:  Mouth/Throat: Oropharynx is clear and moist. No oropharyngeal exudate.  Cardiovascular: Normal rate, regular rhythm and normal heart sounds. Exam reveals no gallop and no friction rub.  No murmur heard.  Pulmonary/Chest: Effort normal and breath sounds normal. No respiratory distress. He has no wheezes.  Abdominal: Soft. Bowel sounds are normal. He exhibits no distension. There is no tenderness.  Lymphadenopathy:  He has no cervical adenopathy.  Ext: left hip incision/bandage in place, no surrounding erythema. picc line to right arm is in place Psychiatric: He has a normal mood and affect. His behavior is normal.     LABS: Results for orders placed or performed during the hospital encounter of 05/12/17 (from the past 48 hour(s))  Type and screen [place]     Status: None   Collection Time: 05/12/17  2:10 PM  Result Value Ref Range   ABO/RH(D) A NEG    Antibody Screen NEG    Sample Expiration 05/15/2017   Aerobic/Anaerobic Culture (surgical/deep wound)     Status: None (Preliminary result)   Collection Time: 05/12/17  5:29 PM  Result Value Ref Range   Specimen Description HIP LEFT JOINT    Special Requests NONE    Gram Stain      ABUNDANT WBC PRESENT,BOTH PMN AND MONONUCLEAR NO ORGANISMS SEEN    Culture      NO GROWTH < 24 HOURS Performed at [facility], [place] [address], [place], [place] [address]    Report Status PENDING   CBC     Status: Abnormal   Collection Time: 05/13/17  5:34 AM  Result Value Ref Range   WBC 4.7 4.0 - 10.5 K/uL   RBC 3.30 (L) 4.22 - 5.81 MIL/uL   Hemoglobin 9.4 (L) 13.0 - 17.0 g/dL   HCT 27.4 (L) 39.0 - 52.0 %   MCV 83.0 78.0 - 100.0 fL   MCH  28.5 26.0 - 34.0 pg   MCHC 34.3 30.0 - 36.0 g/dL   RDW 16.1 (H) 11.5 - 15.5 %   Platelets 257 150 - 400 K/uL  Basic metabolic panel     Status: Abnormal   Collection Time: 05/13/17  5:34 AM  Result Value Ref Range   Sodium 130 (L) 135 - 145 mmol/L   Potassium 4.3 3.5 - 5.1 mmol/L   Chloride 99 (L) 101 - 111 mmol/L   CO2 24 22 - 32 mmol/L   Glucose, Bld 126 (H) 65 - 99 mg/dL   BUN 6 6 - 20 mg/dL   Creatinine, Ser 0.65 0.61 - 1.24 mg/dL   Calcium 8.5 (L) 8.9 - 10.3 mg/dL   GFR calc non Af Amer >60 >60 mL/min  GFR calc Af Amer >60 >60 mL/min    Comment: (NOTE) The eGFR has been calculated using the CKD EPI equation. This calculation has not been validated in all clinical situations. eGFR's persistently <60 mL/min signify possible Chronic Kidney Disease.    Anion gap 7 5 - 15    MICRO: 7/30 tissue cx ngtd IMAGING: Dg Hip Port Unilat With Pelvis 1v Left  Result Date: 05/12/2017 CLINICAL DATA:  Left hip revision. EXAM: DG HIP (WITH OR WITHOUT PELVIS) 1V PORT LEFT COMPARISON:  None. FINDINGS: The patient is status post left hip revision. The acetabular and femoral components are in good position. Postsurgical air seen in the soft tissues the patient is also status post right hip replacement which stable. IMPRESSION: Left hip revision with postoperative air. Electronically Signed   By: Dorise Bullion III M.D   On: 05/12/2017 20:23   Assessment/Plan:  56yo M with hx of recent MSSA left hip prosthetic joint infection s/p I x D on 4/30 but despite IV abtx course of daptomycin plus rifampin still having left hip pain with HW loosening. He is now s/p L hip revision. Concern for indolent infection  - will check sed rate and crp - plan to treat with another course of daptomycin ( at 64m/kg/day) plus rifampin 3092mBID for 6 wks. Then convert to oral regimen  Home health orders listed below: Diagnosis: pji  Culture Result: pending, hx of MSSa  Allergies  Allergen Reactions  .  Cephalexin Rash  . Ace Inhibitors Rash  . Doxycycline Rash    OPAT Orders Discharge antibiotics: Per pharmacy protocol daptomycin at 12m50mg  Duration: 6 wk End Date: Sept 11th  [facility] Protocol:  Labs weekly while on IV antibiotics: _x_ CBC with differential _x_ BMP _x_ CRP _x_ ESR _x_ ck weekly  _x_ Please pull PIC at completion of IV antibiotics   Fax weekly labs to [phone]  Clinic Follow Up Appt: In 4-6 wk  @ [facility] with dr Finian Helvey

## 2017-05-13 NOTE — Op Note (Signed)
NAME:  [name], LONON NO.:  MEDICAL RECORD NO.:  [id number]  LOCATION:                                 FACILITY:  PHYSICIAN:  Pietro Cassis. Alvan Dame, M.D.  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  FINDINGS:  Please note that intraoperatively once I got to the joint capsule, there was inflammatory tissue in the joint, not over pus.  I could not determine if this was infectious in nature or related to residual metal issues in the hip or loose acetabular screw issues. Nonetheless, I did decide to proceed and treat this as a single-stage procedure even if infection with radical debridement, irrigation and antibiotic powder with treatment postoperatively with antibiotics based on his longevity is femoral stem.  It was left in place treating the effective joint space proximal to the bony ingrowth segment.  PROCEDURE:  Revision left total hip arthroplasty utilizing a DePuy 60-mm Pinnacle Gription Deep profile cup with two cancellous screws into the ilium and three peripheral screws.  I then used a 36+ 4 face-changing 10- degree liner for this 60-mm shell, which was a 54 inner diameter.  I used a Smith and Nephew/Richards adapter for an 8-10 taper and then used a 36+ 8 Oxinium femoral head to match the adapter.  ASSISTANT:  Danae Orleans, PA-C.  Note that [name] was present for the entirety of the case from preoperative position, perioperative management of the operative extremity, general facilitation of the case and primary wound closure.  ANESTHESIA:  General.  SPECIMENS:  I did take culture swabs once I had entered the joint space.  DRAINS:  None.  BLOOD LOSS:  850 mL.  INDICATIONS:  [name] is a  56 year old male with longstanding issues with both of his hips.  I had revised his left hip approximately 2+ years ago for metal issues.  He was revised to an [place] MDM dual-mobility cup system.  He had been doing well until recently, confounding this diagnostic capability was an acute hematogenous infection involving his right hip. This was treated with multiple I and D's and has thus remained stable. Around this time, his left hip began bothering him.  At the time, there was concerned about hematogenous spread to the left hip joint.  It was difficult to isolate lapse for this left hip; however, attempted aspiration by Radiology in the hospital wise in the hospital at one point did not return any joint fluid.  Given this, I felt that the left hip was not involved.  He otherwise remained on IV antibiotics appropriate amount of time for 6 weeks and this transitioned to oral antibiotics.  Radiographically, I identified concerns for loosening of his acetabular shell based on serial x-ray evaluation.  This explained his pain in the hip.  At this point, again given the confounding nature of his right hip, I did not do any further workup, we just discussed revising  him.  Risks of infection were discussed and risks of the potential need for future surgery reviewed, and the concern about the bone stock present in his acetabulum based on previous experience of his hips were all reviewed.  Consent was obtained for benefit of stable hip.  PROCEDURE IN DETAIL:  The patient was brought to the operative theater. Once adequate anesthesia, preoperative antibiotics, vancomycin due to a cephalosporin allergy, he was positioned to the right lateral decubitus position with left hip up.  The left lower extremity was then prepped and draped in sterile fashion.  Time-out was performed identifying the patient, planned procedure and extremity.  Old incision was marked and then excised.  Soft  tissue planes created. He had a very weak fascial layer of the gluteus fascia.  Soft dissection was carried through the iliotibial band and gluteal fascia down to this posterior pseudocapsule.  When I excised this and when I saw the rush of inflammatory synovial fluid, we took cultures.  It did not appear overtly purulent.  At this point, I had to make a decision based on amount of bone stock, his previously placed stem, the long-term nature that has been in as well as his current state.  I elected to do a radical first-time debridement with plans to revise his acetabulum and treat him as an infected case extra cautiously.  At this point, the posterior aspect of the hip was radically debrided of all synovial and soft tissue back to muscle in the gluteus region. Dissection was carefully carried out posteriorly.  Once the back two- thirds of the cup were exposed, the hip was dislocated and the old head ball removed.  I then removed the metal acetabular liner by vibration against the previously-placed shell.  He had four previously-placed cancellous screws.  I was able to remove three of them.  Please note that when I did get down to this point, it was obvious that the cup was very loose consistent with our diagnosis.  At this point, I did lot more soft tissue debridement in this area.  I then began reaming with 56-mm reamer given the fact that I used a 58 cup.  I then reamed with 57 and then a 59-mm reamer.  With this reamer, I did get good bone removal, get a good bony base.  I used a curette to debride the cystic lesions within the acetabulum.  At this point, I opened up 15 mL of cancellous bone graft and reverse reamed using a 58 reamer.  After some deliberation, I elected to use the Deep Profile Pinnacle Gription revision shell.  A size 60-mm Deep Profile shell was opened.  It was impacted.  There was some initial scratch fit, but was not perfect.  I placed two screws into the  ilium that were available.  I then placed three peripheral rim screws.  The combination of this screw fixation gave me some secure security that the cup was very well fixed as it moved with manual movement of the entire pelvis.  At this point, I did a trial reduction with a 36+ 4 face-changing liner. We initially used a 36+ 0 ball.  I did find that the hip was stable. The leg lengths appeared to be little bit short.  Thus, the difference in the ultimate selection of the head ball.  Given all these findings, I dislocated the hip.  We irrigated the hip at this point with 3 L of normal saline solution pulse lavage.  The final 36+  4, 10-degree face-changing liner to match the inner diameter 54-mm was then selected and impacted with the lip portion for this left hip around the 3 o'clock.  We then opened the liner for the Richard stem converting it from an 8-10 taper to a 12-14 taper.  The final 36+ 8 Oxinium ball was then opened after trial reduction and impacted onto the clean and dried trunnion with adapter.  The hip was reduced.  The hip had been irrigated throughout the case for total of about 4500 mL of irrigation.  There was no soft tissue posteriorly to debride, I sprinkled about 500 mg of vancomycin powder into the posterior aspect of the wound, then reapproximated the iliotibial band and gluteal fascia using #1 Vicryl antimicrobial suture. The remainder of the powder was then sprinkled in the superficial layer. The skin was then closed with 2-0 Vicryl and a running 3-0 Monocryl. The hip was cleaned, dried and dressed sterilely using surgical glue and Aquacel dressing.  He was then brought to the recovery room in stable condition tolerating the procedure well.  Postoperatively, we will put a PICC line in.  I will treat him with vancomycin for 6 weeks.  He will be partial weightbearing until we are confident that his cuff is secured, these findings will be reviewed  with him.     Pietro Cassis Alvan Dame, M.D.     MDO/MEDQ  D:  05/12/2017  T:  05/12/2017  Job:  [id number]

## 2017-05-14 LAB — BASIC METABOLIC PANEL
Anion gap: 8 (ref 5–15)
BUN: 7 mg/dL (ref 6–20)
CALCIUM: 8.5 mg/dL — AB (ref 8.9–10.3)
CO2: 23 mmol/L (ref 22–32)
CREATININE: 0.63 mg/dL (ref 0.61–1.24)
Chloride: 103 mmol/L (ref 101–111)
GFR calc non Af Amer: >60 mL/min (ref >60)
GLUCOSE: 103 mg/dL — AB (ref 65–99)
Potassium: 4.1 mmol/L (ref 3.5–5.1)
Sodium: 134 mmol/L — ABNORMAL LOW (ref 135–145)

## 2017-05-14 LAB — CBC
HEMATOCRIT: 26.4 % — AB (ref 39.0–52.0)
Hemoglobin: 8.9 g/dL — ABNORMAL LOW (ref 13.0–17.0)
MCH: 28.3 pg (ref 26.0–34.0)
MCHC: 33.7 g/dL (ref 30.0–36.0)
MCV: 83.8 fL (ref 78.0–100.0)
PLATELETS: 271 10*3/uL (ref 150–400)
RBC: 3.15 MIL/uL — AB (ref 4.22–5.81)
RDW: 16.4 % — AB (ref 11.5–15.5)
WBC: 3.9 10*3/uL — AB (ref 4.0–10.5)

## 2017-05-14 LAB — CK: Total CK: 88 U/L (ref 49–397)

## 2017-05-14 MED ORDER — FERROUS SULFATE 325 (65 FE) MG PO TABS: 325.0000 mg | ORAL_TABLET | Freq: Three times a day (TID) | ORAL | 3 refills | Status: DC

## 2017-05-14 MED ORDER — HEPARIN SOD (PORK) LOCK FLUSH 100 UNIT/ML IV SOLN
250.0000 [IU] | INTRAVENOUS | Status: AC | PRN
  Administered 2017-05-14: 250 [IU]

## 2017-05-14 MED ORDER — METHOCARBAMOL 500 MG PO TABS: 500.0000 mg | ORAL_TABLET | Freq: Four times a day (QID) | ORAL | 0 refills | Status: DC | PRN

## 2017-05-14 MED ORDER — ASPIRIN 81 MG PO CHEW: 81.0000 mg | CHEWABLE_TABLET | Freq: Two times a day (BID) | ORAL | 0 refills | Status: AC

## 2017-05-14 MED ORDER — POLYETHYLENE GLYCOL 3350 17 G PO PACK: 17.0000 g | PACK | Freq: Two times a day (BID) | ORAL | 0 refills | Status: DC

## 2017-05-14 MED ORDER — HYDROCODONE-ACETAMINOPHEN 7.5-325 MG PO TABS: 1.0000 | ORAL_TABLET | ORAL | 0 refills | Status: DC | PRN

## 2017-05-14 MED ORDER — DOCUSATE SODIUM 100 MG PO CAPS: 100.0000 mg | ORAL_CAPSULE | Freq: Two times a day (BID) | ORAL | 0 refills | Status: DC

## 2017-05-14 NOTE — Progress Notes (Signed)
Physical Therapy Treatment Patient Details Name: [name] MRN: [id number] DOB: [birth year] Today's Date: 05/14/2017    History of Present Illness Pt is a 56 year old male s/p left revision total hip arthroplasty, posterior approach    PT Comments    Assisted OOB to amb a greater distance then returned to bed to perform TE's followed by ICE.   Follow Up Recommendations  No PT follow up     Equipment Recommendations  None recommended by PT    Recommendations for Other Services       Precautions / Restrictions Precautions Precautions: Posterior Hip Precaution Comments: pt recalled 3/3 THP Restrictions Weight Bearing Restrictions: Yes LLE Weight Bearing: Partial weight bearing LLE Partial Weight Bearing Percentage or Pounds: 50% Other Position/Activity Restrictions: pt awarw he is 50% WBing    Mobility  Bed Mobility Overal bed mobility: Needs Assistance Bed Mobility: Supine to Sit;Sit to Supine     Supine to sit: Min assist Sit to supine: Min assist   General bed mobility comments: assist for L LE and increased time  Transfers Overall transfer level: Needs assistance Equipment used: Rolling walker (2 wheeled) Transfers: Sit to/from Stand Sit to Stand: Min guard;Min assist         General transfer comment: VC's to avoid hip flex > 90 degrees and increased time.    Ambulation/Gait Ambulation/Gait assistance: Supervision;Min guard Ambulation Distance (Feet): 55 Feet Assistive device: Rolling walker (2 wheeled) Gait Pattern/deviations: Step-to pattern;Decreased stance time - left;Antalgic Gait velocity: decreased   General Gait Details: verbal cues for sequence and PWB status   Stairs Stairs: Yes   Stair Management: No rails;Step to pattern;Forwards;With crutches Number of Stairs: 2 General stair comments: <25% VC's on proper sequencing and safety.  Pt performed well  Wheelchair Mobility    Modified Rankin (Stroke Patients Only)       Balance                                             Cognition Arousal/Alertness: Awake/alert Behavior During Therapy: WFL for tasks assessed/performed Overall Cognitive Status: Within Functional Limits for tasks assessed                                        Exercises   Total Hip Replacement TE's 10 reps ankle pumps 10 reps knee presses 10 reps heel slides 10 reps SAQ's 10 reps ABD Followed by ICE     General Comments        Pertinent Vitals/Pain Pain Assessment: 0-10 Pain Score: 4  Pain Location: L hip Pain Descriptors / Indicators: Aching;Sore;Operative site guarding Pain Intervention(s): Monitored during session;Repositioned;Premedicated before session;Ice applied    Home Living                      Prior Function            PT Goals (current goals can now be found in the care plan section) Progress towards PT goals: Progressing toward goals    Frequency    7X/week      PT Plan Current plan remains appropriate    Co-evaluation              AM-PAC PT "6 Clicks" Daily Activity  Outcome Measure  Difficulty turning over in  bed (including adjusting bedclothes, sheets and blankets)?: A Lot Difficulty moving from lying on back to sitting on the side of the bed? : A Lot Difficulty sitting down on and standing up from a chair with arms (e.g., wheelchair, bedside commode, etc,.)?: A Lot   Help needed walking in hospital room?: A Little Help needed climbing 3-5 steps with a railing? : A Lot 6 Click Score: 11    End of Session Equipment Utilized During Treatment: Gait belt Activity Tolerance: Patient limited by fatigue;Patient limited by pain Patient left: in bed;with call bell/phone within reach Nurse Communication:  (pt ready for D/C to home) PT Visit Diagnosis: Unsteadiness on feet (R26.81)     Time: 1610-9604 PT Time Calculation (min) (ACUTE ONLY): 25 min  Charges:  $Gait Training: 8-22 mins $Therapeutic  Activity: 8-22 mins                    G Codes:       Rica Koyanagi  PTA [facility]  Acute  Rehab Pager      [phone]

## 2017-05-14 NOTE — Progress Notes (Signed)
OT Cancellation Note  Patient Details Name: [name] MRN: [id number] DOB: [birth year]   Cancelled Treatment:    Reason Eval/Treat Not Completed: OT screened, no needs identified, will sign off  [name] 05/14/2017, 9:32 AM  Lesle Chris, OTR/L [phone] 05/14/2017

## 2017-05-14 NOTE — Anesthesia Postprocedure Evaluation (Signed)
Anesthesia Post Note  Patient: [name]  Procedure(s) Performed: Procedure(s) (LRB): LEFT TOTAL HIP REVISION POSTERIOR (Left)     Patient location during evaluation: PACU Anesthesia Type: General Level of consciousness: awake and alert Pain management: pain level controlled Vital Signs Assessment: post-procedure vital signs reviewed and stable Respiratory status: spontaneous breathing, nonlabored ventilation, respiratory function stable and patient connected to nasal cannula oxygen Cardiovascular status: blood pressure returned to baseline and stable Postop Assessment: no signs of nausea or vomiting Anesthetic complications: no    Last Vitals:  Vitals:   05/14/17 0537 05/14/17 0826  BP: (!) 111/53 117/62  Pulse: 63 67  Resp: 16   Temp: 36.6 C     Last Pain:  Vitals:   05/14/17 0753  TempSrc:   PainSc: 2                  Montez Hageman

## 2017-05-14 NOTE — Progress Notes (Signed)
     Subjective: 2 Days Post-Op Procedure(s) (LRB): LEFT TOTAL HIP REVISION POSTERIOR (Left)   Patient reports pain as mild, pain controlled. No events throughout the night. Looking forward to getting home.  Reviewed intra-operative findings and plans with regards to antibiotic treatments.  Ready to be discharged home depending on approval of home antibiotics.  Objective:   VITALS:   Vitals:   05/14/17 0537 05/14/17 0826  BP: (!) 111/53 117/62  Pulse: 63 67  Resp: 16   Temp: 97.9 F (36.6 C)     Dorsiflexion/Plantar flexion intact Incision: dressing C/D/I No cellulitis present Compartment soft  LABS  Recent Labs  05/13/17 0534 05/14/17 0530  HGB 9.4* 8.9*  HCT 27.4* 26.4*  WBC 4.7 3.9*  PLT 257 271     Recent Labs  05/13/17 0534 05/14/17 0530  NA 130* 134*  K 4.3 4.1  BUN 6 7  CREATININE 0.65 0.63  GLUCOSE 126* 103*     Assessment/Plan: 2 Days Post-Op Procedure(s) (LRB): LEFT TOTAL HIP REVISION POSTERIOR (Left) Up with therapy Discharge home with home health  Follow [facility] in 2 weeks at [facility]. Follow up with OLIN,Jenah Vanasten D in 2 weeks.  Contact information:  [facility] [address] [place] [phone]    Obese (BMI 30-39.9) Estimated body mass index is 31.64 kg/m as calculated from the following:   Height as of this encounter: 5\' 7"  (1.702 m).   Weight as of this encounter: 91.6 kg (202 lb). Patient also counseled that weight may inhibit the healing process Patient counseled that losing weight will help with future health issues         [place] Pugh. Ambree Frances   PAC  05/14/2017, 9:29 AM

## 2017-05-15 ENCOUNTER — Telehealth: Payer: Self-pay | Admitting: Internal Medicine

## 2017-05-15 ENCOUNTER — Ambulatory Visit: Payer: Self-pay | Admitting: Internal Medicine

## 2017-05-15 NOTE — Telephone Encounter (Signed)
Spoke with patient to stop taking rifampin since his MSSA isolate is resistant

## 2017-05-17 LAB — AEROBIC/ANAEROBIC CULTURE (SURGICAL/DEEP WOUND)

## 2017-05-17 LAB — AEROBIC/ANAEROBIC CULTURE W GRAM STAIN (SURGICAL/DEEP WOUND)

## 2017-05-19 NOTE — Discharge Summary (Signed)
Physician Discharge Summary  Patient ID: [name] MRN: [id number] DOB/AGE: Oct 25, 1960 56 y.o.  Admit date: 05/12/2017 Discharge date: 05/14/2017   Procedures:  Procedure(s) (LRB): LEFT TOTAL HIP REVISION POSTERIOR (Left)  Attending Physician:  Dr. Paralee Cancel   Admission Diagnoses:   Left hip pain S/P THA  Discharge Diagnoses:  Principal Problem:   S/P left TH revision  Past Medical History:  Diagnosis Date  . Arthritis   . History of blood transfusion   . Hypertension   . PONV (postoperative nausea and vomiting)   . Sleep apnea    hx of had surgery for sleep apnea    HPI:    [name], 56 y.o. male, has a history of pain and functional disability in the left hip due to pain s/p THA and patient has failed non-surgical conservative treatments for greater than 12 weeks to include NSAID's and/or analgesics, corticosteriod injections, use of assistive devices and activity modification. The indications for the revision total hip arthroplasty are bearing surface wear leading to  symptomatic synovitis and implant or hip misalignment. Onset of symptoms was gradual starting 4+ months ago with rapidlly worsening course since that time.  Prior procedures on the left hip include arthroplasty and arthroplasty revision. Patient currently rates pain in the left hip at 7 out of 10 with activity.  There is night pain, worsening of pain with activity and weight bearing, trendelenberg gait, pain that interfers with activities of daily living and pain with passive range of motion. Patient has evidence of failure of the THA by imaging studies.  This condition presents safety issues increasing the risk of falls.  There is no current active infection.   Risks, benefits and expectations were discussed with the patient.  Risks including but not limited to the risk of anesthesia, blood clots, nerve damage, blood vessel damage, failure of the prosthesis, infection and up to and including death.   Patient understand the risks, benefits and expectations and wishes to proceed with surgery.   PCP: System, Pcp Not In   Discharged Condition: good  Hospital Course:  Patient underwent the above stated procedure on 05/12/2017. Patient tolerated the procedure well and brought to the recovery room in good condition and subsequently to the floor.  POD #1 BP: 95/45 ; Pulse: 65 ; Temp: 97.8 F (36.6 C) ; Resp: 15 Patient reports pain as mild to moderate.  Comfortable.  Reviewed intra-operative findings and plans.  No events. Neurovascular intact and incision: dressing C/D/I.  LABS  Basename    HGB     9.4  HCT     27.4   POD #2  BP: 117/62 ; Pulse: 67 ; Temp: 97.9 F (36.6 C) ; Resp: 16 Patient reports pain as mild, pain controlled. No events throughout the night. Looking forward to getting home.  Reviewed intra-operative findings and plans with regards to antibiotic treatments.  Ready to be discharged home depending on approval of home antibiotics. Dorsiflexion/plantar flexion intact, incision: dressing C/D/I, no cellulitis present and compartment soft.   LABS  Basename    HGB     8.9  HCT     26.4    Discharge Exam: General appearance: alert, cooperative and no distress Extremities: Homans sign is negative, no sign of DVT, no edema, redness or tenderness in the calves or thighs and no ulcers, gangrene or trophic changes  Disposition: Home with follow up in 2 weeks   Follow-up Information    Paralee Cancel, MD. Schedule an appointment as soon  as possible for a visit in 2 week(s).   Specialty:  Orthopedic Surgery Contact information: [address] [place] [address] [phone]        [facility] Follow up.   Why:  IV antibiotics Contact information: [address] [place] [address] [phone]           Discharge Instructions    Call MD / Call 911    Complete by:  As directed    If you experience chest pain or  shortness of breath, CALL 911 and be transported to the hospital emergency room.  If you develope a fever above 101 F, pus (white drainage) or increased drainage or redness at the wound, or calf pain, call your surgeon's office.   Change dressing    Complete by:  As directed    Maintain surgical dressing until follow up in the clinic. If the edges start to pull up, may reinforce with tape. If the dressing is no longer working, may remove and cover with gauze and tape, but must keep the area dry and clean.  Call with any questions or concerns.   Constipation Prevention    Complete by:  As directed    Drink plenty of fluids.  Prune juice may be helpful.  You may use a stool softener, such as Colace (over the counter) 100 mg twice a day.  Use MiraLax (over the counter) for constipation as needed.   Diet - low sodium heart healthy    Complete by:  As directed    Discharge instructions    Complete by:  As directed    Maintain surgical dressing until follow up in the clinic. If the edges start to pull up, may reinforce with tape. If the dressing is no longer working, may remove and cover with gauze and tape, but must keep the area dry and clean.  Follow up in 2 weeks at [facility]. Call with any questions or concerns.   Home infusion instructions [facility] May follow [place] Dosing Protocol; May administer Cathflo as needed to maintain patency of vascular access device.; Flushing of vascular access device: per [facility] Protocol: 0.9% NaCl pre/post medica...    Complete by:  As directed    Instructions:  May follow [place] Dosing Protocol   Instructions:  May administer Cathflo as needed to maintain patency of vascular access device.   Instructions:  Flushing of vascular access device: per [facility] Protocol: 0.9% NaCl pre/post medication administration and prn patency; Heparin 100 u/ml, 99m for implanted ports and Heparin 10u/ml, 584mfor all other central venous catheters.    Instructions:  May follow [facility] Anaphylaxis Protocol for First Dose Administration in the home: 0.9% NaCl at 25-50 ml/hr to maintain IV access for protocol meds. Epinephrine 0.3 ml IV/IM PRN and Benadryl 25-50 IV/IM PRN s/s of anaphylaxis.   Instructions:  [facility] Coordinator (RN) to assist per patient IV care needs in the home PRN.   Partial weight bearing    Complete by:  As directed    % Body Weight:  50   Laterality:  left   Extremity:  Lower   TED hose    Complete by:  As directed    Use stockings (TED hose) for 2 weeks on both leg(s).  You may remove them at night for sleeping.      Allergies as of 05/14/2017      Reactions   Cephalexin Rash   Ace  Inhibitors Rash   Doxycycline Rash      Medication List    STOP taking these medications   ALEVE 220 MG tablet Generic drug:  naproxen sodium   HYDROcodone-acetaminophen 5-325 MG tablet Commonly known as:  NORCO/VICODIN Replaced by:  HYDROcodone-acetaminophen 7.5-325 MG tablet     TAKE these medications   amLODipine 2.5 MG tablet Commonly known as:  NORVASC Take 2.5 mg by mouth daily.   aspirin 81 MG chewable tablet Chew 1 tablet (81 mg total) by mouth 2 (two) times daily. Take for 4 weeks.   carvedilol 12.5 MG tablet Commonly known as:  COREG Take 12.5 mg by mouth 2 (two) times daily.   celecoxib 200 MG capsule Commonly known as:  CELEBREX Take 200 mg by mouth daily. with food   daptomycin IVPB Commonly known as:  CUBICIN Inject 800 mg into the vein daily. Indication:  Prosthetic joint infection Last Day of Therapy:  06/24/2017 Labs - Once weekly:  CBC/D, BMP, CK, CRP, ESR Fax lab results to [phone] Pull PICC when the 6-week course of antibiotic has been completed   docusate sodium 100 MG capsule Commonly known as:  COLACE Take 1 capsule (100 mg total) by mouth 2 (two) times daily.   ferrous sulfate 325 (65 FE) MG tablet Take 1 tablet (325 mg total) by mouth 3 (three) times daily after  meals.   HYDROcodone-acetaminophen 7.5-325 MG tablet Commonly known as:  NORCO Take 1-2 tablets by mouth every 4 (four) hours as needed for moderate pain. Replaces:  HYDROcodone-acetaminophen 5-325 MG tablet   irbesartan 150 MG tablet Commonly known as:  AVAPRO Take 150 mg by mouth daily.   methocarbamol 500 MG tablet Commonly known as:  ROBAXIN Take 1 tablet (500 mg total) by mouth every 6 (six) hours as needed for muscle spasms.   multivitamin with minerals Tabs tablet Take 1 tablet by mouth daily.   polyethylene glycol packet Commonly known as:  MIRALAX / GLYCOLAX Take 17 g by mouth 2 (two) times daily.   rifampin 300 MG capsule Commonly known as:  RIFADIN Take 1 capsule (300 mg total) by mouth 2 (two) times daily.   sulfamethoxazole-trimethoprim 800-160 MG tablet Commonly known as:  BACTRIM DS,SEPTRA DS Take 1 tablet by mouth 2 (two) times daily.   Vitamin D (Ergocalciferol) 50000 units Caps capsule Commonly known as:  DRISDOL Take 50,000 Units by mouth every Saturday.            Home Infusion Instuctions        Start     Ordered   05/13/17 0000  Home infusion instructions [facility] May follow [place] Dosing Protocol; May administer Cathflo as needed to maintain patency of vascular access device.; Flushing of vascular access device: per [facility] Protocol: 0.9% NaCl pre/post medica...    Question Answer Comment  Instructions May follow [place] Dosing Protocol   Instructions May administer Cathflo as needed to maintain patency of vascular access device.   Instructions Flushing of vascular access device: per [facility] Protocol: 0.9% NaCl pre/post medication administration and prn patency; Heparin 100 u/ml, 68m for implanted ports and Heparin 10u/ml, 558mfor all other central venous catheters.   Instructions May follow [facility] Anaphylaxis Protocol for First Dose Administration in the home: 0.9% NaCl at 25-50 ml/hr to maintain IV access for protocol meds.  Epinephrine 0.3 ml IV/IM PRN and Benadryl 25-50 IV/IM PRN s/s of anaphylaxis.   Instructions [facility] Infusion Coordinator (RN) to assist per patient IV  care needs in the home PRN.      05/13/17 1626       Signed: [place] Pugh. Kolden Dupee   PA-C  05/19/2017, 12:06 PM

## 2017-05-20 ENCOUNTER — Other Ambulatory Visit: Payer: Self-pay | Admitting: Pharmacist

## 2017-05-26 ENCOUNTER — Encounter: Payer: Self-pay | Admitting: Internal Medicine

## 2017-05-27 ENCOUNTER — Telehealth: Payer: Self-pay | Admitting: *Deleted

## 2017-05-27 NOTE — Telephone Encounter (Signed)
Patient called stating that [name] told him they needed an order from this office for a bio patch for his picc line. Called and spoke to [name] and she will discuss this with nursing. [name]

## 2017-05-28 ENCOUNTER — Other Ambulatory Visit: Payer: Self-pay | Admitting: Pharmacist

## 2017-06-09 ENCOUNTER — Encounter: Payer: Self-pay | Admitting: Internal Medicine

## 2017-06-22 ENCOUNTER — Other Ambulatory Visit: Payer: Self-pay | Admitting: Internal Medicine

## 2017-06-23 ENCOUNTER — Encounter: Payer: Self-pay | Admitting: Internal Medicine

## 2017-06-23 ENCOUNTER — Ambulatory Visit (INDEPENDENT_AMBULATORY_CARE_PROVIDER_SITE_OTHER): Payer: Worker's Compensation | Admitting: Internal Medicine

## 2017-06-23 VITALS — BP 155/81 | HR 57 | Temp 97.6°F | Wt 200.8 lb

## 2017-06-23 DIAGNOSIS — A4901 Methicillin susceptible Staphylococcus aureus infection, unspecified site: Secondary | ICD-10-CM

## 2017-06-23 DIAGNOSIS — Z5181 Encounter for therapeutic drug level monitoring: Secondary | ICD-10-CM

## 2017-06-23 DIAGNOSIS — T8450XS Infection and inflammatory reaction due to unspecified internal joint prosthesis, sequela: Secondary | ICD-10-CM

## 2017-06-23 MED ORDER — SULFAMETHOXAZOLE-TRIMETHOPRIM 800-160 MG PO TABS: 1.0000 | ORAL_TABLET | Freq: Two times a day (BID) | ORAL | 3 refills | Status: DC

## 2017-06-23 NOTE — Progress Notes (Signed)
RFV: left hip pji  Patient ID: [name], male   DOB: [birth year], 56 y.o.   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 He underwent a single-stage procedure with radical debridement, irrigation and antibiotic powder with treatment postoperatively with IV antibiotics, daptomycin, which he is slated to finish tomorrow to complete a 6 wk course, with plans with oral abtx for chronic suppression. OR cultures on 7/30 grew MSSA but also R to rifampin. Overall doing well. Tolerating iv abtx. No difficulty with picc line. In reviewing his labs he has not had difficulty with changes in renal function assoc with abtx.  Outpatient Encounter Prescriptions as of 06/23/2017  Medication Sig  . amLODipine (NORVASC) 2.5 MG tablet Take 2.5 mg by mouth daily.  . carvedilol (COREG) 12.5 MG tablet Take 12.5 mg by mouth 2 (two) times daily.  . celecoxib (CELEBREX) 200 MG capsule Take 200 mg by mouth daily. with food  . daptomycin (CUBICIN) IVPB Inject 800 mg into the vein daily. Indication:  Prosthetic joint infection Last Day of Therapy:  06/24/2017 Labs - Once weekly:  CBC/D, BMP, CK, CRP, ESR Fax lab results to [phone] Pull PICC when the 6-week course of antibiotic has been completed  . ferrous sulfate 325 (65 FE) MG tablet Take 1 tablet (325 mg total) by mouth 3 (three) times daily after meals.  . Multiple Vitamin (MULTIVITAMIN WITH MINERALS) TABS tablet Take 1 tablet by mouth daily.  . polyethylene glycol (MIRALAX / GLYCOLAX) packet Take 17 g by mouth 2 (two) times daily.  . Vitamin D, Ergocalciferol, (DRISDOL) 50000 units CAPS capsule Take 50,000 Units by mouth every Saturday.  . docusate sodium (COLACE) 100 MG capsule Take 1 capsule (100 mg total) by mouth 2 (two) times daily. (Patient not taking: Reported on 06/23/2017)  . HYDROcodone-acetaminophen (NORCO) 7.5-325 MG tablet Take 1-2 tablets by mouth every 4 (four) hours as  needed for moderate pain. (Patient not taking: Reported on 06/23/2017)  . irbesartan (AVAPRO) 150 MG tablet Take 150 mg by mouth daily.  . irbesartan (AVAPRO) 150 MG tablet Take by mouth.  . methocarbamol (ROBAXIN) 500 MG tablet Take 1 tablet (500 mg total) by mouth every 6 (six) hours as needed for muscle spasms. (Patient not taking: Reported on 06/23/2017)  . rifampin (RIFADIN) 300 MG capsule Take 1 capsule (300 mg total) by mouth 2 (two) times daily. (Patient not taking: Reported on 06/23/2017)  . sulfamethoxazole-trimethoprim (BACTRIM DS,SEPTRA DS) 800-160 MG tablet Take 1 tablet by mouth 2 (two) times daily. (Patient not taking: Reported on 06/23/2017)   No facility-administered encounter medications on file as of 06/23/2017.      Patient Active Problem List   Diagnosis Date Noted  . 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 Acute hyponatremia 12/31/2016  . Acute renal disease 12/31/2016  . Prosthetic joint infection ([place]) 12/31/2016  . Obese 07/19/2015  . Essential hypertension 11/06/2010     Health Maintenance Due  Topic Date Due  . Hepatitis C Screening  November 09, 1960  . TETANUS/TDAP  01/21/1978  . COLONOSCOPY  01/21/2009  . INFLUENZA VACCINE  05/14/2017    Social History  Substance Use Topics  . Smoking status: Former Smoker    Quit date: 10/14/2004  . Smokeless tobacco: Never Used  . Alcohol use No   Review of Systems Review of Systems  Constitutional: Negative for fever, chills, diaphoresis, activity change, appetite change, fatigue and unexpected weight change.  HENT: Negative  for congestion, sore throat, rhinorrhea, sneezing, trouble swallowing and sinus pressure.  Eyes: Negative for photophobia and visual disturbance.  Respiratory: Negative for cough, chest tightness, shortness of breath, wheezing and stridor.  Cardiovascular: Negative for chest pain, palpitations and leg swelling.  Gastrointestinal: Negative for nausea, vomiting, abdominal pain, diarrhea, constipation, blood  in stool, abdominal distention and anal bleeding.  Genitourinary: Negative for dysuria, hematuria, flank pain and difficulty urinating.  Musculoskeletal: Negative for myalgias, back pain, joint swelling, arthralgias and gait problem.  Skin: Negative for color change, pallor, rash and wound.  Neurological: Negative for dizziness, tremors, weakness and light-headedness.  Hematological: Negative for adenopathy. Does not bruise/bleed easily.  Psychiatric/Behavioral: Negative for behavioral problems, confusion, sleep disturbance, dysphoric mood, decreased concentration and agitation.    Physical Exam   BP (!) 155/81   Pulse (!) 57   Temp 97.6 F (36.4 C) (Oral)   Wt 200 lb 12.8 oz (91.1 kg)   BMI 31.45 kg/m    Physical Exam  Constitutional: He is oriented to person, place, and time. He appears well-developed and well-nourished. No distress.  HENT:  Mouth/Throat: Oropharynx is clear and moist. No oropharyngeal exudate.  Cardiovascular: Normal rate, regular rhythm and normal heart sounds. Exam reveals no gallop and no friction rub.  No murmur heard.  Pulmonary/Chest: Effort normal and breath sounds normal. No respiratory distress. He has no wheezes.  Abdominal: Soft. Bowel sounds are normal. He exhibits no distension. There is no tenderness.  Lymphadenopathy:  He has no cervical adenopathy.  Neurological: He is alert and oriented to person, place, and time.  Skin: Skin is warm and dry. No rash noted. No erythema.  Psychiatric: He has a normal mood and affect. His behavior is normal.    CBC Lab Results  Component Value Date   WBC 3.9 (L) 05/14/2017   RBC 3.15 (L) 05/14/2017   HGB 8.9 (L) 05/14/2017   HCT 26.4 (L) 05/14/2017   PLT 271 05/14/2017   MCV 83.8 05/14/2017   MCH 28.3 05/14/2017   MCHC 33.7 05/14/2017   RDW 16.4 (H) 05/14/2017   LYMPHSABS 0.7 05/19/2010   MONOABS 0.6 05/19/2010   EOSABS 0.1 05/19/2010    BMET Lab Results  Component Value Date   NA 134 (L)  05/14/2017   K 4.1 05/14/2017   CL 103 05/14/2017   CO2 23 05/14/2017   GLUCOSE 103 (H) 05/14/2017   BUN 7 05/14/2017   CREATININE 0.63 05/14/2017   CALCIUM 8.5 (L) 05/14/2017   GFRNONAA >60 05/14/2017   GFRAA >60 05/14/2017    Lab Results  Component Value Date   ESRSEDRATE 118 (H) 01/01/2017   Lab Results  Component Value Date   CRP 29.7 (H) 01/01/2017     Assessment and Plan  Staph aureus Prosthetic joint infection, sequelae = he will Need 4.5 months of bactrim. Has 30 d supply at home . Will check bmp tomorrow with picc line pull. As well as sed rate and crp  Therapeutic drug monitoring- will check bmp every 2 wk while on bactrim  rtc in 2 month

## 2017-06-27 ENCOUNTER — Other Ambulatory Visit: Payer: Self-pay | Admitting: Pharmacist

## 2017-07-01 ENCOUNTER — Telehealth: Payer: Self-pay | Admitting: *Deleted

## 2017-07-01 NOTE — Telephone Encounter (Addendum)
Received request from patient's short term disability requesting information, notes, physician's signature. Placed in Dr Storm Frisk box, made copy in accordion file in triage. Landis Gandy, RN

## 2017-08-25 ENCOUNTER — Ambulatory Visit (INDEPENDENT_AMBULATORY_CARE_PROVIDER_SITE_OTHER): Payer: Worker's Compensation | Admitting: Internal Medicine

## 2017-08-25 ENCOUNTER — Encounter: Payer: Self-pay | Admitting: Internal Medicine

## 2017-08-25 VITALS — BP 168/80 | HR 51 | Temp 97.8°F | Ht 67.5 in | Wt 198.0 lb

## 2017-08-25 DIAGNOSIS — T8450XD Infection and inflammatory reaction due to unspecified internal joint prosthesis, subsequent encounter: Secondary | ICD-10-CM | POA: Diagnosis not present

## 2017-08-25 DIAGNOSIS — K5900 Constipation, unspecified: Secondary | ICD-10-CM

## 2017-08-25 MED ORDER — SULFAMETHOXAZOLE-TRIMETHOPRIM 800-160 MG PO TABS: 1.0000 | ORAL_TABLET | Freq: Two times a day (BID) | ORAL | 3 refills | Status: DC

## 2017-08-25 NOTE — Progress Notes (Signed)
Follow up for chronic pji  Patient ID: [name], male   DOB: 08-20-1961, 56 y.o.   MRN: [id number]  HPI Liev is a 56yo M with history ofFailed left total hip arthroplasty with loose Acetabulum. He underwent a single-stage procedure with radical debridement, irrigation and antibiotic powder with treatment postoperatively with IV antibiotics, daptomycin x 6 wk course, and transitioned to bactrim for chronic suppression. OR cultures on 7/30 grew MSSA but also R to rifampin. Overall doing well.Last seen on 9/10. On bactrim now for 2 months of the 4.5 months  ROS: Having constipation, otherwise 12 point ros is negative  Had flu shot at work Outpatient Encounter Medications as of 08/25/2017  Medication Sig  . amLODipine (NORVASC) 2.5 MG tablet Take 2.5 mg by mouth daily.  Marland Kitchen atorvastatin (LIPITOR) 20 MG tablet Take 20 mg daily by mouth.  . carvedilol (COREG) 12.5 MG tablet Take 12.5 mg by mouth 2 (two) times daily.  . celecoxib (CELEBREX) 200 MG capsule Take 200 mg by mouth daily. with food  . Multiple Vitamin (MULTIVITAMIN WITH MINERALS) TABS tablet Take 1 tablet by mouth daily.  . polyethylene glycol (MIRALAX / GLYCOLAX) packet Take 17 g by mouth 2 (two) times daily.  Marland Kitchen sulfamethoxazole-trimethoprim (BACTRIM DS,SEPTRA DS) 800-160 MG tablet Take 1 tablet by mouth 2 (two) times daily.  . Vitamin D, Ergocalciferol, (DRISDOL) 50000 units CAPS capsule Take 50,000 Units by mouth every Saturday.  . docusate sodium (COLACE) 100 MG capsule Take 1 capsule (100 mg total) by mouth 2 (two) times daily. (Patient not taking: Reported on 06/23/2017)  . ferrous sulfate 325 (65 FE) MG tablet Take 1 tablet (325 mg total) by mouth 3 (three) times daily after meals. (Patient not taking: Reported on 08/25/2017)  . HYDROcodone-acetaminophen (NORCO) 7.5-325 MG tablet Take 1-2 tablets by mouth every 4 (four) hours as needed for moderate pain. (Patient not taking: Reported on 06/23/2017)  . irbesartan (AVAPRO)  150 MG tablet Take 150 mg by mouth daily.  . irbesartan (AVAPRO) 150 MG tablet Take by mouth.  . methocarbamol (ROBAXIN) 500 MG tablet Take 1 tablet (500 mg total) by mouth every 6 (six) hours as needed for muscle spasms. (Patient not taking: Reported on 06/23/2017)  . rifampin (RIFADIN) 300 MG capsule Take 1 capsule (300 mg total) by mouth 2 (two) times daily. (Patient not taking: Reported on 06/23/2017)   No facility-administered encounter medications on file as of 08/25/2017.      Patient Active Problem List   Diagnosis Date Noted  . 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 Acute hyponatremia 12/31/2016  . Acute renal disease 12/31/2016  . Prosthetic joint infection ([place]) 12/31/2016  . Obese 07/19/2015  . Essential hypertension 11/06/2010     Health Maintenance Due  Topic Date Due  . Hepatitis C Screening  [birth year]  . TETANUS/TDAP  01/21/1978  . COLONOSCOPY  01/21/2009  . INFLUENZA VACCINE  05/14/2017    Physical Exam   BP (!) 168/80   Pulse (!) 51   Temp 97.8 F (36.6 C) (Oral)   Ht 5' 7.5" (1.715 m)   Wt 198 lb (89.8 kg)   BMI 30.55 kg/m    Physical Exam  Constitutional: He is oriented to person, place, and time. He appears well-developed and well-nourished. No distress.  HENT:  Mouth/Throat: Oropharynx is clear and moist. No oropharyngeal exudate.  Cardiovascular: Normal rate, regular rhythm and normal heart sounds. Exam reveals no gallop and no friction rub.  No murmur heard.  Pulmonary/Chest: Effort  normal and breath sounds normal. No respiratory distress. He has no wheezes.  Neurological: He is alert and oriented to person, place, and time.  Skin: Skin is warm and dry. No rash noted. No erythema.  Psychiatric: He has a normal mood and affect. His behavior is normal.    CBC Lab Results  Component Value Date   WBC 3.9 (L) 05/14/2017   RBC 3.15 (L) 05/14/2017   HGB 8.9 (L) 05/14/2017   HCT 26.4 (L) 05/14/2017   PLT 271 05/14/2017   MCV 83.8 05/14/2017   MCH  28.3 05/14/2017   MCHC 33.7 05/14/2017   RDW 16.4 (H) 05/14/2017   LYMPHSABS 0.7 05/19/2010   MONOABS 0.6 05/19/2010   EOSABS 0.1 05/19/2010    BMET Lab Results  Component Value Date   NA 134 (L) 05/14/2017   K 4.1 05/14/2017   CL 103 05/14/2017   CO2 23 05/14/2017   GLUCOSE 103 (H) 05/14/2017   BUN 7 05/14/2017   CREATININE 0.63 05/14/2017   CALCIUM 8.5 (L) 05/14/2017   GFRNONAA >60 05/14/2017   GFRAA >60 05/14/2017   Lab Results  Component Value Date   ESRSEDRATE 9 08/25/2017   Lab Results  Component Value Date   CRP 1.9 08/25/2017      Assessment and Plan  Chronic pji = Continue with 2 more months of bactrim for pji tx and reassess if need to extend at that time. Will check labs. Addendum = labs look great, trending down as expected  Constipation = increase miralax dosing

## 2017-08-26 LAB — CBC WITH DIFFERENTIAL/PLATELET
BASOS ABS: 21 {cells}/uL (ref 0–200)
Basophils Relative: 0.5 %
EOS PCT: 2.4 %
Eosinophils Absolute: 98 cells/uL (ref 15–500)
HEMATOCRIT: 39.2 % (ref 38.5–50.0)
HEMOGLOBIN: 13.4 g/dL (ref 13.2–17.1)
LYMPHS ABS: 1779 {cells}/uL (ref 850–3900)
MCH: 31 pg (ref 27.0–33.0)
MCHC: 34.2 g/dL (ref 32.0–36.0)
MCV: 90.7 fL (ref 80.0–100.0)
MPV: 11.8 fL (ref 7.5–12.5)
Monocytes Relative: 13.1 %
NEUTROS ABS: 1665 {cells}/uL (ref 1500–7800)
Neutrophils Relative %: 40.6 %
Platelets: 231 10*3/uL (ref 140–400)
RBC: 4.32 10*6/uL (ref 4.20–5.80)
RDW: 14.3 % (ref 11.0–15.0)
Total Lymphocyte: 43.4 %
WBC mixed population: 537 cells/uL (ref 200–950)
WBC: 4.1 10*3/uL (ref 3.8–10.8)

## 2017-08-26 LAB — BASIC METABOLIC PANEL
BUN: 19 mg/dL (ref 7–25)
CALCIUM: 9.6 mg/dL (ref 8.6–10.3)
CHLORIDE: 103 mmol/L (ref 98–110)
CO2: 25 mmol/L (ref 20–32)
CREATININE: 1.01 mg/dL (ref 0.70–1.33)
GLUCOSE: 71 mg/dL (ref 65–99)
POTASSIUM: 4.3 mmol/L (ref 3.5–5.3)
Sodium: 134 mmol/L — ABNORMAL LOW (ref 135–146)

## 2017-08-26 LAB — SEDIMENTATION RATE: Sed Rate: 9 mm/h (ref 0–20)

## 2017-08-26 LAB — C-REACTIVE PROTEIN: CRP: 1.9 mg/L (ref <8.0)

## 2017-09-17 IMAGING — DX DG HIP (WITH OR WITHOUT PELVIS) 1V PORT*R*
3 series · 3 of 3 positions shown · non-contrast
Comparison: 07/17/2015 and 05/19/2010

CLINICAL DATA: Status post right total hip arthroplasty revision

EXAM:
DG HIP (WITH OR WITHOUT PELVIS) 1V PORT RIGHT

[pelvis ap (1 of 2)]
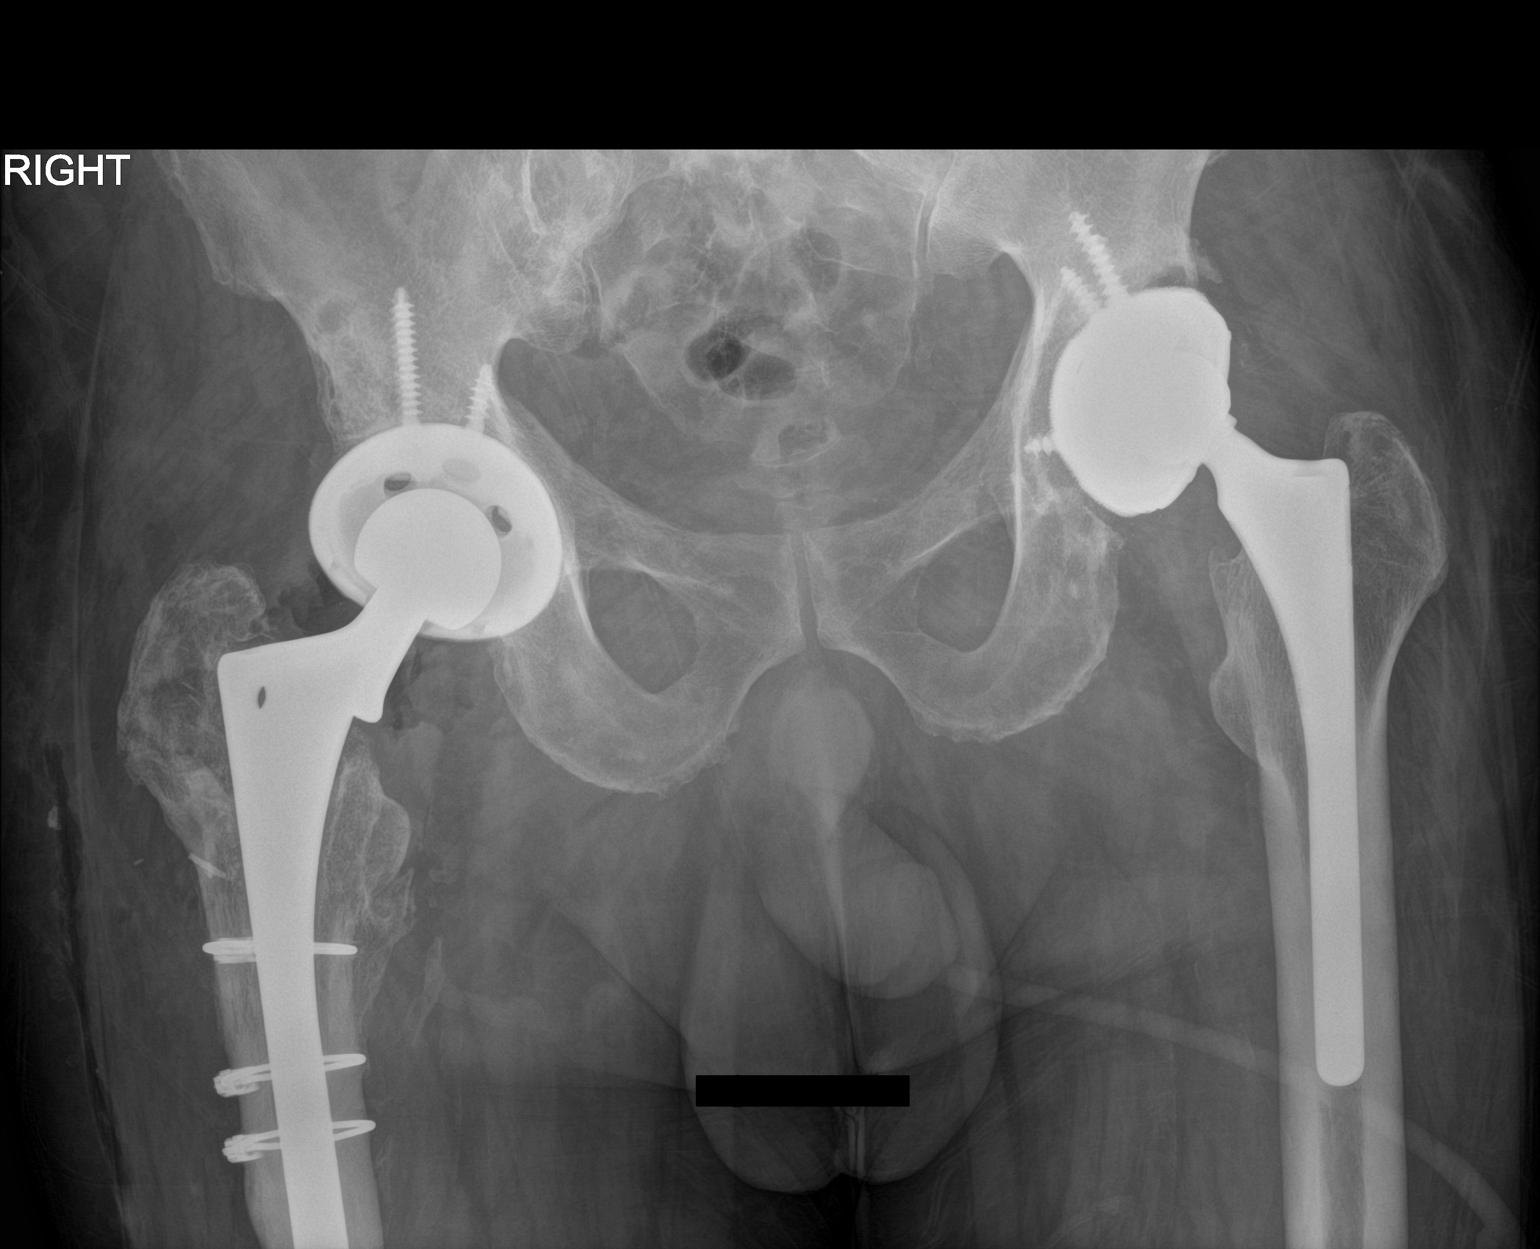

[hip lat]
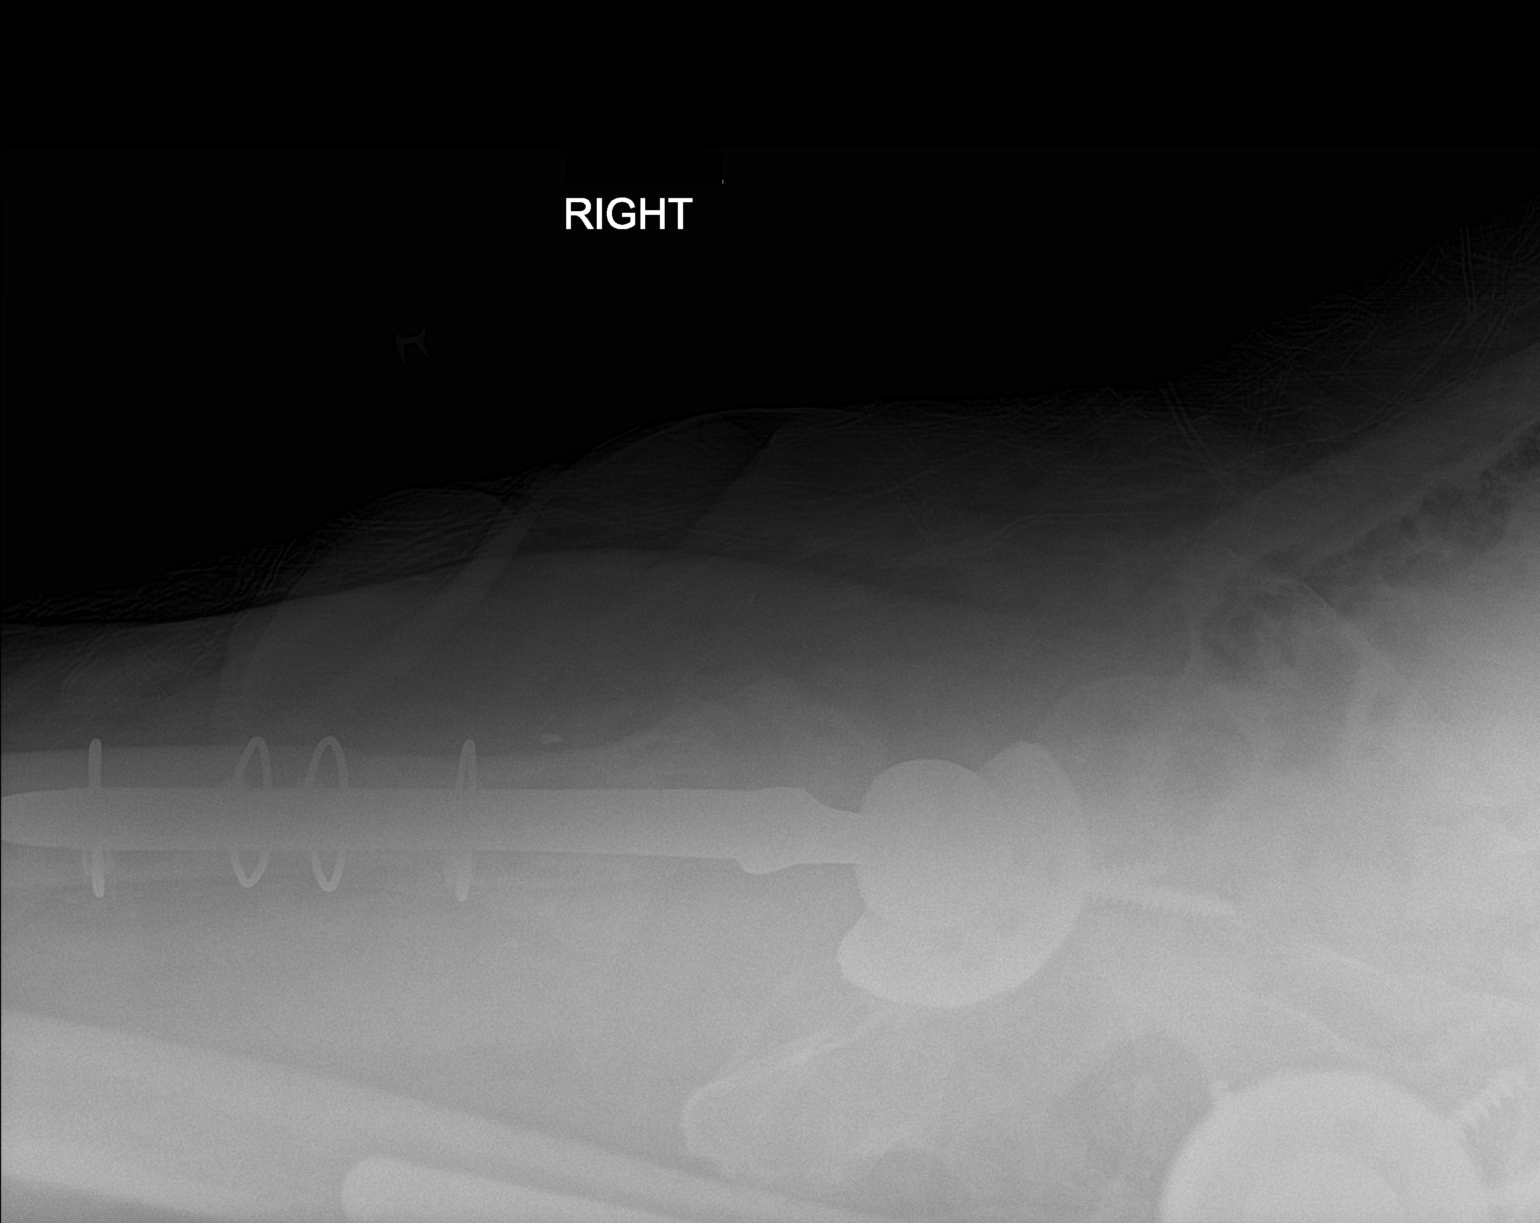

[pelvis ap (2 of 2)]
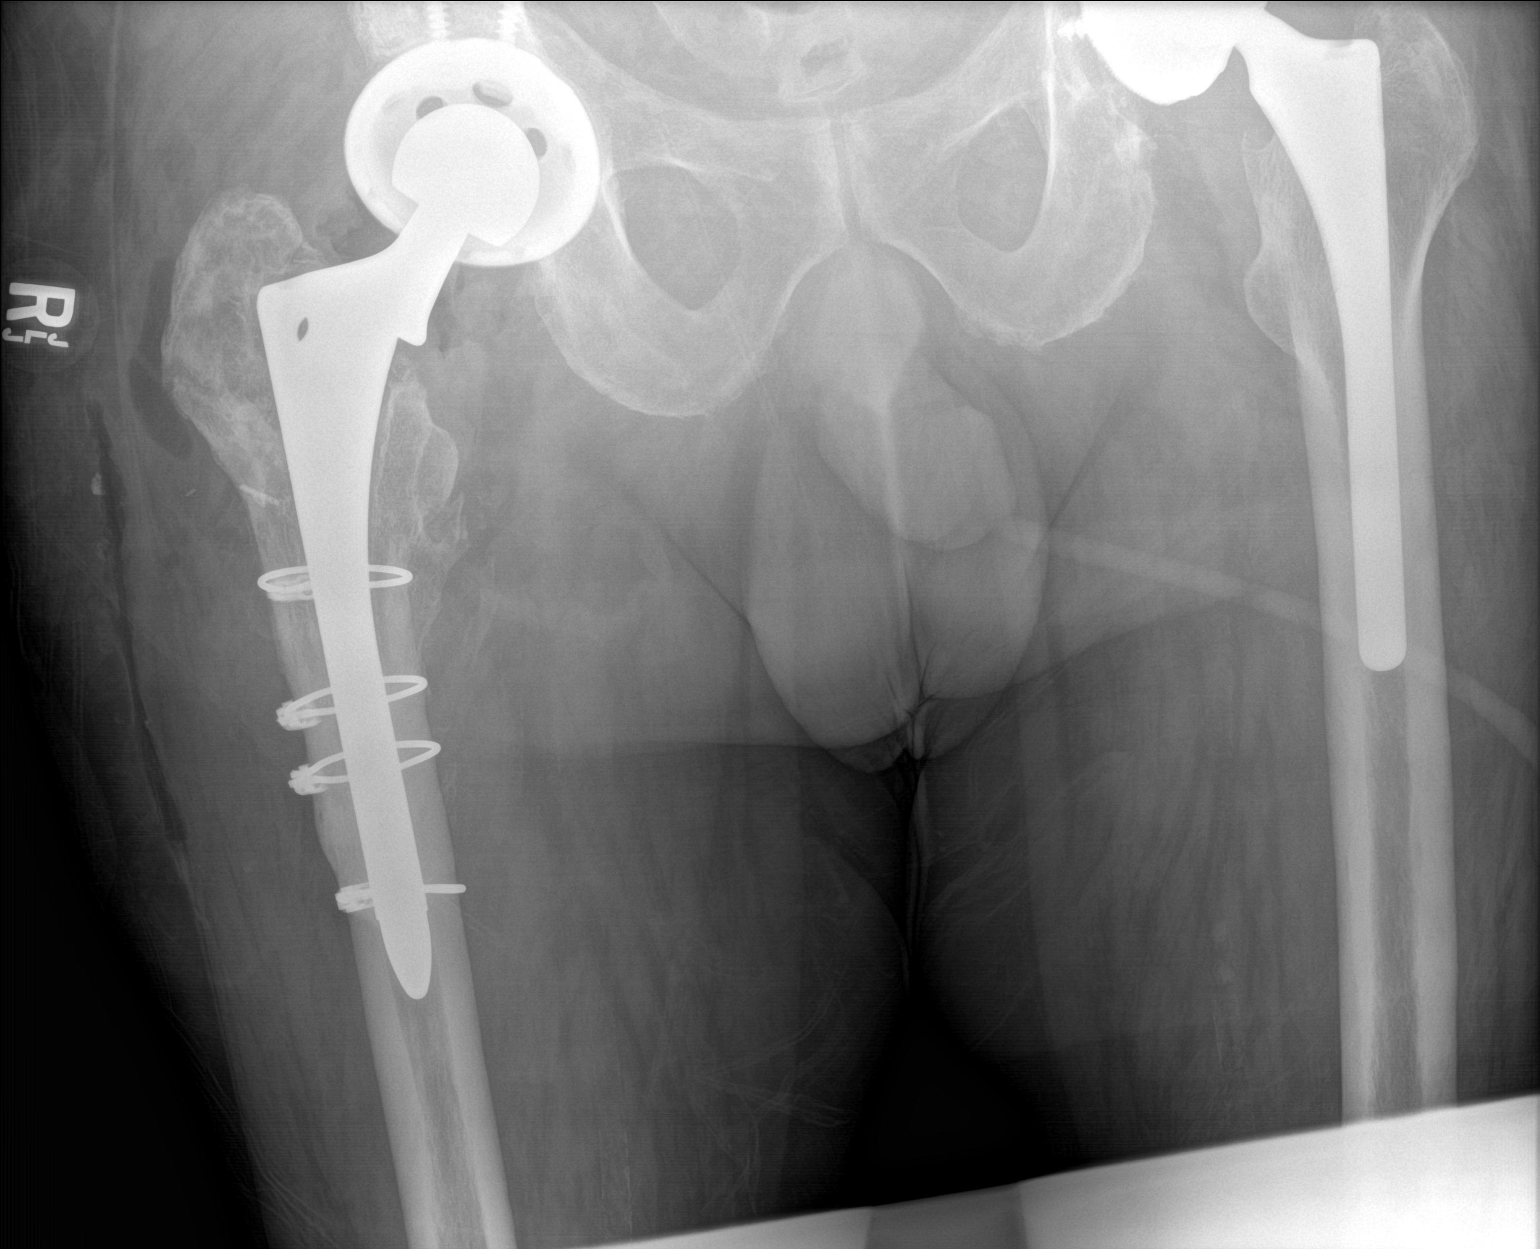

[3 of 3 positions shown; findings below may reference images not displayed]

FINDINGS: There are bilateral total hip arthroplasties. Both acetabular
components appear well seated with normal positioning of the femoral
head prostheses. Right femoral stem prosthesis and associated
cerclage wires are in normal position. No perihardware lucency to
suggest loosening. No periprosthetic fracture.
IMPRESSION: Normal position and appearance of right total hip arthroplasty,
status post revision.

## 2017-10-28 ENCOUNTER — Encounter: Payer: Self-pay | Admitting: Internal Medicine

## 2017-10-28 ENCOUNTER — Ambulatory Visit (INDEPENDENT_AMBULATORY_CARE_PROVIDER_SITE_OTHER): Payer: Worker's Compensation | Admitting: Internal Medicine

## 2017-10-28 VITALS — BP 150/62 | HR 50 | Temp 97.6°F | Ht 67.5 in | Wt 203.0 lb

## 2017-10-28 DIAGNOSIS — T8450XS Infection and inflammatory reaction due to unspecified internal joint prosthesis, sequela: Secondary | ICD-10-CM | POA: Diagnosis not present

## 2017-10-28 NOTE — Progress Notes (Signed)
RFV:  Patient ID: [name], male   DOB: 1960/11/18, 57 y.o.   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 OR cultures on 7/30 grew MSSA but also R to rifampin. Overall doing well.Last seen on 9/10. On bactrim x 4.5 months. He has just finished his abtx today  We last saw him 2 months ago. Doing well. Labs look good. Cr function is stable. He feels that he is the best in 9 yrs on regards to his hip   Outpatient Encounter Medications as of 10/28/2017  Medication Sig  . amLODipine (NORVASC) 2.5 MG tablet Take 2.5 mg by mouth daily.  Marland Kitchen aspirin 81 MG chewable tablet Chew 81 mg by mouth daily.  Marland Kitchen atorvastatin (LIPITOR) 20 MG tablet Take 20 mg daily by mouth.  . carvedilol (COREG) 12.5 MG tablet Take 12.5 mg by mouth 2 (two) times daily.  . celecoxib (CELEBREX) 200 MG capsule Take 200 mg by mouth daily. with food  . Multiple Vitamin (MULTIVITAMIN WITH MINERALS) TABS tablet Take 1 tablet by mouth daily.  . polyethylene glycol (MIRALAX / GLYCOLAX) packet Take 17 g by mouth 2 (two) times daily.  Marland Kitchen sulfamethoxazole-trimethoprim (BACTRIM DS,SEPTRA DS) 800-160 MG tablet Take 1 tablet 2 (two) times daily by mouth.  . triamcinolone ointment (KENALOG) 0.1 % 1 APP 3 TIMES A DAY APPLIED TOPICALLY 30 DAYS  . Vitamin D, Ergocalciferol, (DRISDOL) 50000 units CAPS capsule Take 50,000 Units by mouth every Saturday.  . irbesartan (AVAPRO) 150 MG tablet Take 150 mg by mouth daily.  . irbesartan (AVAPRO) 150 MG tablet Take by mouth.   No facility-administered encounter medications on file as of 10/28/2017.      Patient Active Problem List   Diagnosis Date Noted  . 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 Acute  hyponatremia 12/31/2016  . Acute renal disease 12/31/2016  . 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 Obese 07/19/2015  . Essential hypertension 11/06/2010     Health Maintenance Due  Topic Date Due  . Hepatitis C Screening  11/03/1960  . TETANUS/TDAP  01/21/1978  . COLONOSCOPY  01/21/2009  . INFLUENZA VACCINE  05/14/2017     Review of Systems Review of Systems  Constitutional: Negative for fever, chills, diaphoresis, activity change, appetite change, fatigue and unexpected weight change.  HENT: Negative for congestion, sore throat, rhinorrhea, sneezing, trouble swallowing and sinus pressure.  Eyes: Negative for photophobia and visual disturbance.  Respiratory: Negative for cough, chest tightness, shortness of breath, wheezing and stridor.  Cardiovascular: Negative for chest pain, palpitations and leg swelling.  Gastrointestinal: Negative for nausea, vomiting, abdominal pain, diarrhea, constipation, blood in stool, abdominal distention and anal bleeding.  Genitourinary: Negative for dysuria, hematuria, flank pain and difficulty urinating.  Musculoskeletal: Negative for myalgias, back pain, joint swelling, arthralgias and gait problem.  Skin: Negative for color change, pallor, rash and wound.  Neurological: Negative for dizziness, tremors, weakness and light-headedness.  Hematological: Negative for adenopathy. Does not bruise/bleed easily.  Psychiatric/Behavioral: Negative for behavioral problems, confusion, sleep disturbance, dysphoric mood, decreased concentration and agitation.    Physical Exam   BP (!) 150/62 (BP Location: Right Arm, Patient Position: Sitting, Cuff Size: Normal)   Pulse (!) 50   Temp 97.6 F (36.4 C) (Oral)   Ht 5' 7.5" (1.715 m)  Wt 203 lb (92.1 kg)   BMI 31.33 kg/m   Physical Exam  Constitutional: He is oriented to person, place, and time. He appears well-developed and well-nourished. No distress.  HENT:  Mouth/Throat: Oropharynx is clear and  moist. No oropharyngeal exudate.  Skin: Skin is warm and dry. No rash noted. No erythema.  Psychiatric: He has a normal mood and affect. His behavior is normal.    CBC Lab Results  Component Value Date   WBC 4.1 08/25/2017   RBC 4.32 08/25/2017   HGB 13.4 08/25/2017   HCT 39.2 08/25/2017   PLT 231 08/25/2017   MCV 90.7 08/25/2017   MCH 31.0 08/25/2017   MCHC 34.2 08/25/2017   RDW 14.3 08/25/2017   LYMPHSABS 1,779 08/25/2017   MONOABS 0.6 05/19/2010   EOSABS 98 08/25/2017    BMET Lab Results  Component Value Date   NA 134 (L) 08/25/2017   K 4.3 08/25/2017   CL 103 08/25/2017   CO2 25 08/25/2017   GLUCOSE 71 08/25/2017   BUN 19 08/25/2017   CREATININE 1.01 08/25/2017   CALCIUM 9.6 08/25/2017   GFRNONAA >60 05/14/2017   GFRAA >60 05/14/2017   Lab Results  Component Value Date   ESRSEDRATE 9 08/25/2017   Lab Results  Component Value Date   CRP 1.9 08/25/2017   Lab Results  Component Value Date   ESRSEDRATE 6 10/28/2017   Lab Results  Component Value Date   CRP 1.4 10/28/2017      Assessment and Plan pji of hip = has finished his abtx course over the last 6 months. His inflammatory markers normalized at last visit. Will check sed rate and crp to ensure that he doesn't need any further abtx  rtc as needed

## 2017-10-29 LAB — SEDIMENTATION RATE: Sed Rate: 6 mm/h (ref 0–20)

## 2017-10-29 LAB — C-REACTIVE PROTEIN: CRP: 1.4 mg/L (ref <8.0)

## 2018-05-06 NOTE — Progress Notes (Signed)
ECHO-01/08/17-epic

## 2018-05-06 NOTE — Patient Instructions (Addendum)
[name]  05/06/2018   Your procedure is scheduled on: 05/12/2018   Report to [facility] Main  Entrance  Report to admitting at    12 noon     Call this number if you have problems the morning of surgery [phone]   Remember: Do not eat food  :After Midnight. May have clear liquids from 12 midnite until 0830am morning of surgery then nothing by mouth.       CLEAR LIQUID DIET   Foods Allowed                                                                     Foods Excluded  Coffee and tea, regular and decaf                             liquids that you cannot  Plain Jell-O in any flavor                                             see through such as: Fruit ices (not with fruit pulp)                                     milk, soups, orange juice  Iced Popsicles                                    All solid food Carbonated beverages, regular and diet                                    Cranberry, grape and apple juices Sports drinks like Gatorade Lightly seasoned clear broth or consume(fat free) Sugar, honey syrup  Sample Menu Breakfast                                Lunch                                     Supper Cranberry juice                    Beef broth                            Chicken broth Jell-O                                     Grape juice  Apple juice Coffee or tea                        Jell-O                                      Popsicle                                                Coffee or tea                        Coffee or tea  _____________________________________________________________________     Take these medicines the morning of surgery with A SIP OF WATER: Amlodipine ( Norvasc), Carvedilol ( Coreg) , Hydrocodone                                You may not have any metal on your body including hair pins and              piercings  Do not wear jewelry,  lotions, powders or perfumes, deodorant          .              Men may shave face and neck.   Do not bring valuables to the hospital. [place].  Contacts, dentures or bridgework may not be worn into surgery.  Leave suitcase in the car. After surgery it may be brought to your room.                     Please read over the following fact sheets you were given: _____________________________________________________________________             [facility] - Preparing for Surgery Before surgery, you can play an important role.  Because skin is not sterile, your skin needs to be as free of germs as possible.  You can reduce the number of germs on your skin by washing with CHG (chlorahexidine gluconate) soap before surgery.  CHG is an antiseptic cleaner which kills germs and bonds with the skin to continue killing germs even after washing. Please DO NOT use if you have an allergy to CHG or antibacterial soaps.  If your skin becomes reddened/irritated stop using the CHG and inform your nurse when you arrive at Short Stay. Do not shave (including legs and underarms) for at least 48 hours prior to the first CHG shower.  You may shave your face/neck. Please follow these instructions carefully:  1.  Shower with CHG Soap the night before surgery and the  morning of Surgery.  2.  If you choose to wash your hair, wash your hair first as usual with your  normal  shampoo.  3.  After you shampoo, rinse your hair and body thoroughly to remove the  shampoo.                           4.  Use CHG as you would any other liquid soap.  You can apply chg directly  to the skin and wash                       Gently with a scrungie or clean washcloth.  5.  Apply the CHG Soap to your body ONLY FROM THE NECK DOWN.   Do not use on face/ open                           Wound or open sores. Avoid contact with eyes, ears mouth and genitals (private parts).                       Wash face,  Genitals (private parts)  with your normal soap.             6.  Wash thoroughly, paying special attention to the area where your surgery  will be performed.  7.  Thoroughly rinse your body with warm water from the neck down.  8.  DO NOT shower/wash with your normal soap after using and rinsing off  the CHG Soap.                9.  Pat yourself dry with a clean towel.            10.  Wear clean pajamas.            11.  Place clean sheets on your bed the night of your first shower and do not  sleep with pets. Day of Surgery : Do not apply any lotions/deodorants the morning of surgery.  Please wear clean clothes to the hospital/surgery center.  FAILURE TO FOLLOW THESE INSTRUCTIONS MAY RESULT IN THE CANCELLATION OF YOUR SURGERY PATIENT SIGNATURE_________________________________  NURSE SIGNATURE__________________________________  ________________________________________________________________________  WHAT IS A BLOOD TRANSFUSION? Blood Transfusion Information  A transfusion is the replacement of blood or some of its parts. Blood is made up of multiple cells which provide different functions.  Red blood cells carry oxygen and are used for blood loss replacement.  White blood cells fight against infection.  Platelets control bleeding.  Plasma helps clot blood.  Other blood products are available for specialized needs, such as hemophilia or other clotting disorders. BEFORE THE TRANSFUSION  Who gives blood for transfusions?   Healthy volunteers who are fully evaluated to make sure their blood is safe. This is blood bank blood. Transfusion therapy is the safest it has ever been in the practice of medicine. Before blood is taken from a donor, a complete history is taken to make sure that person has no history of diseases nor engages in risky social behavior (examples are intravenous drug use or sexual activity with multiple partners). The donor's travel history is screened to minimize risk of transmitting infections,  such as malaria. The donated blood is tested for signs of infectious diseases, such as HIV and hepatitis. The blood is then tested to be sure it is compatible with you in order to minimize the chance of a transfusion reaction. If you or a relative donates blood, this is often done in anticipation of surgery and is not appropriate for emergency situations. It takes many days to process the donated blood. RISKS AND COMPLICATIONS Although transfusion therapy is very safe and saves many lives, the main dangers of transfusion include:   Getting an infectious disease.  Developing a transfusion reaction. This is an allergic reaction to something in the blood you were given. Every precaution is taken to  prevent this. The decision to have a blood transfusion has been considered carefully by your caregiver before blood is given. Blood is not given unless the benefits outweigh the risks. AFTER THE TRANSFUSION  Right after receiving a blood transfusion, you will usually feel much better and more energetic. This is especially true if your red blood cells have gotten low (anemic). The transfusion raises the level of the red blood cells which carry oxygen, and this usually causes an energy increase.  The nurse administering the transfusion will monitor you carefully for complications. HOME CARE INSTRUCTIONS  No special instructions are needed after a transfusion. You may find your energy is better. Speak with your caregiver about any limitations on activity for underlying diseases you may have. SEEK MEDICAL CARE IF:   Your condition is not improving after your transfusion.  You develop redness or irritation at the intravenous (IV) site. SEEK IMMEDIATE MEDICAL CARE IF:  Any of the following symptoms occur over the next 12 hours:  Shaking chills.  You have a temperature by mouth above 102 F (38.9 C), not controlled by medicine.  Chest, back, or muscle pain.  People around you feel you are not acting  correctly or are confused.  Shortness of breath or difficulty breathing.  Dizziness and fainting.  You get a rash or develop hives.  You have a decrease in urine output.  Your urine turns a dark color or changes to pink, red, or brown. Any of the following symptoms occur over the next 10 days:  You have a temperature by mouth above 102 F (38.9 C), not controlled by medicine.  Shortness of breath.  Weakness after normal activity.  The white part of the eye turns yellow (jaundice).  You have a decrease in the amount of urine or are urinating less often.  Your urine turns a dark color or changes to pink, red, or brown. Document Released: 09/27/2000 Document Revised: 12/23/2011 Document Reviewed: 05/16/2008 ExitCare Patient Information 2014 [place].  _______________________________________________________________________  Incentive Spirometer  An incentive spirometer is a tool that can help keep your lungs clear and active. This tool measures how well you are filling your lungs with each breath. Taking long deep breaths may help reverse or decrease the chance of developing breathing (pulmonary) problems (especially infection) following:  A long period of time when you are unable to move or be active. BEFORE THE PROCEDURE   If the spirometer includes an indicator to show your best effort, your nurse or respiratory therapist will set it to a desired goal.  If possible, sit up straight or lean slightly forward. Try not to slouch.  Hold the incentive spirometer in an upright position. INSTRUCTIONS FOR USE  1. Sit on the edge of your bed if possible, or sit up as far as you can in bed or on a chair. 2. Hold the incentive spirometer in an upright position. 3. Breathe out normally. 4. Place the mouthpiece in your mouth and seal your lips tightly around it. 5. Breathe in slowly and as deeply as possible, raising the piston or the ball toward the top of the column. 6. Hold  your breath for 3-5 seconds or for as long as possible. Allow the piston or ball to fall to the bottom of the column. 7. Remove the mouthpiece from your mouth and breathe out normally. 8. Rest for a few seconds and repeat Steps 1 through 7 at least 10 times every 1-2 hours when you are awake. Take your time and  take a few normal breaths between deep breaths. 9. The spirometer may include an indicator to show your best effort. Use the indicator as a goal to work toward during each repetition. 10. After each set of 10 deep breaths, practice coughing to be sure your lungs are clear. If you have an incision (the cut made at the time of surgery), support your incision when coughing by placing a pillow or rolled up towels firmly against it. Once you are able to get out of bed, walk around indoors and cough well. You may stop using the incentive spirometer when instructed by your caregiver.  RISKS AND COMPLICATIONS  Take your time so you do not get dizzy or light-headed.  If you are in pain, you may need to take or ask for pain medication before doing incentive spirometry. It is harder to take a deep breath if you are having pain. AFTER USE  Rest and breathe slowly and easily.  It can be helpful to keep track of a log of your progress. Your caregiver can provide you with a simple table to help with this. If you are using the spirometer at home, follow these instructions: [place] IF:   You are having difficultly using the spirometer.  You have trouble using the spirometer as often as instructed.  Your pain medication is not giving enough relief while using the spirometer.  You develop fever of 100.5 F (38.1 C) or higher. SEEK IMMEDIATE MEDICAL CARE IF:   You cough up bloody sputum that had not been present before.  You develop fever of 102 F (38.9 C) or greater.  You develop worsening pain at or near the incision site. MAKE SURE YOU:   Understand these instructions.  Will  watch your condition.  Will get help right away if you are not doing well or get worse. Document Released: 02/10/2007 Document Revised: 12/23/2011 Document Reviewed: 04/13/2007 [facility] Patient Information 2014 [place], [place].   ________________________________________________________________________

## 2018-05-06 NOTE — Progress Notes (Signed)
Need orders in epic.  Preop on 7/25 at 0900am. Thank You

## 2018-05-07 ENCOUNTER — Encounter (HOSPITAL_COMMUNITY)
Admission: RE | Admit: 2018-05-07 | Discharge: 2018-05-07 | Disposition: A | Discharge status: Home / Self Care | Payer: Worker's Compensation | Source: Ambulatory Visit | Attending: Orthopedic Surgery | Admitting: Orthopedic Surgery

## 2018-05-07 ENCOUNTER — Encounter (HOSPITAL_COMMUNITY): Payer: Self-pay

## 2018-05-07 ENCOUNTER — Other Ambulatory Visit: Payer: Self-pay

## 2018-05-07 DIAGNOSIS — Z01812 Encounter for preprocedural laboratory examination: Secondary | ICD-10-CM | POA: Insufficient documentation

## 2018-05-07 DIAGNOSIS — Z01810 Encounter for preprocedural cardiovascular examination: Secondary | ICD-10-CM | POA: Diagnosis present

## 2018-05-07 HISTORY — DX: Malignant (primary) neoplasm, unspecified: C80.1

## 2018-05-07 HISTORY — DX: Other specified symptoms and signs involving the circulatory and respiratory systems: R09.89

## 2018-05-07 LAB — BASIC METABOLIC PANEL
Anion gap: 9 (ref 5–15)
BUN: 14 mg/dL (ref 6–20)
CALCIUM: 9.3 mg/dL (ref 8.9–10.3)
CO2: 26 mmol/L (ref 22–32)
Chloride: 99 mmol/L (ref 98–111)
Creatinine, Ser: 0.83 mg/dL (ref 0.61–1.24)
GFR calc Af Amer: >60 mL/min (ref >60)
Glucose, Bld: 144 mg/dL — ABNORMAL HIGH (ref 70–99)
POTASSIUM: 4.4 mmol/L (ref 3.5–5.1)
SODIUM: 134 mmol/L — AB (ref 135–145)

## 2018-05-07 LAB — CBC
HEMATOCRIT: 33.4 % — AB (ref 39.0–52.0)
Hemoglobin: 11.5 g/dL — ABNORMAL LOW (ref 13.0–17.0)
MCH: 32.9 pg (ref 26.0–34.0)
MCHC: 34.4 g/dL (ref 30.0–36.0)
MCV: 95.4 fL (ref 78.0–100.0)
PLATELETS: 211 10*3/uL (ref 150–400)
RBC: 3.5 MIL/uL — ABNORMAL LOW (ref 4.22–5.81)
RDW: 13.9 % (ref 11.5–15.5)
WBC: 8.4 10*3/uL (ref 4.0–10.5)

## 2018-05-07 NOTE — Pre-Procedure Instructions (Signed)
CBC, Bmet results sent to Dr. Aurea Graff office

## 2018-05-08 NOTE — H&P (Signed)
[name] is an 57 y.o. male.    Chief Complaint:   Acute right total hip infection   Procedure:   I&D of the right hip with exchange of the femoral head and liner  HPI:   This 57 year-old male patient is 14 months status post RIGHT TOTAL HIP ARTHROPLASTY REVISION performed on 02/13/2017, 16 months status post RIGHT TOTAL HIP ARTHROPLASTY DEBRIDEMENT AND POLY EXCHANGE performed on 01/09/2017 and 23 months status post RIGHT TOTAL HIP ARTHROPLASTY REVISION performed on 05/29/2016.  The symptoms began on 05/03/2018 in the absence of an acute injury. The patient reports acute right hip pain. Associated symptoms include fever, swelling and redness. His treatments thus far include clindamycin and bactrim.  Additionally, the patient had dental work on 04/02/2018 after taking his prescribed clindamycin. He mentions itching after taking bactrim and clindamycin.  Due to the relative acuity of his presentation I am going to try to treat this as an acute infection. I will not set him up for aspiration based on his presentation and fever. Labs will most likely confirm suspicion for infection. If they do not then aspiration could be ordered. Otherwise I will plan on getting him on the OR schedule next week for I&D head and liner exchange with IV antibiotics for 6 weeks postop followed by 6 months of oral antibiotics as we have done in the past. Goal here is joint preservation.  Risks, benefits and expectations were discussed with the patient.  Risks including but not limited to the risk of anesthesia, blood clots, nerve damage, blood vessel damage, failure of the prosthesis, infection and up to and including death.  Patient understand the risks, benefits and expectations and wishes to proceed with surgery.    PCP: Yevette Edwards, NP  D/C Plans:       Home   Post-op Meds:       No Rx given  Tranexamic Acid:      To be given - IV    Decadron:      Is to be given  FYI:      ASA  Norco  PICC line  DME:    Pt already has equipment  PT:   HHPT / RN   PMH: Past Medical History:  Diagnosis Date  . Arthritis   . Bruit of right carotid artery    diagnosed August 2018  . Cancer ([place])    basal cell CA removed from both legs  . History of blood transfusion   . Hypertension   . PONV (postoperative nausea and vomiting)   . Sleep apnea    hx of had surgery for sleep apnea    PSH: Past Surgical History:  Procedure Laterality Date  . ANTERIOR HIP REVISION Left 07/17/2015   Procedure: LEFT POSTERIOR  HIP REVISION;  Surgeon: Paralee Cancel, MD;  Location: [facility];  Service: Orthopedics;  Laterality: Left;  . BACK SURGERY     four back surgeries (3 fusions)  . INCISION AND DRAINAGE HIP Right 01/01/2017   Procedure: IRRIGATION AND DEBRIDEMENT RIGHT TOTAL HIP WITH HEAD AND LINER EXCHANGE AND PLACEMENT OF STIMULIN BEADS;  Surgeon: Paralee Cancel, MD;  Location: [facility];  Service: Orthopedics;  Laterality: Right;  . INCISION AND DRAINAGE HIP Right 01/07/2017   Procedure: IRRIGATION AND DEBRIDEMENT HIP;  Surgeon: Paralee Cancel, MD;  Location: [facility];  Service: Orthopedics;  Laterality: Right;  . INCISION AND DRAINAGE HIP Right 02/10/2017   Procedure: Repeat IRRIGATION AND DEBRIDEMENT HIP;  Surgeon: Paralee Cancel,  MD;  Location: [facility];  Service: Orthopedics;  Laterality: Right;  . JOINT REPLACEMENT     6 surgeries on right hip, 1 on left hip   including I&D Right hip  . PICC LINE PLACE PERIPHERAL ([place] HX)     present at preop appt. 02/07/17 right arm  . right knee surgery - torn maniscus    . TEE WITHOUT CARDIOVERSION N/A 01/08/2017   Procedure: TRANSESOPHAGEAL ECHOCARDIOGRAM (TEE);  Surgeon: Josue Hector, MD;  Location: [facility] ENDOSCOPY;  Service: Cardiovascular;  Laterality: N/A;  . TONSILLECTOMY     adnoids removed  . TOTAL HIP REVISION Right 05/27/2016   Procedure: TOTAL HIP REVISION;  Surgeon: Paralee Cancel, MD;  Location: [facility];  Service: Orthopedics;  Laterality: Right;  . TOTAL HIP REVISION Left  05/12/2017   Procedure: LEFT TOTAL HIP REVISION POSTERIOR;  Surgeon: Paralee Cancel, MD;  Location: [facility];  Service: Orthopedics;  Laterality: Left;  90 mins  . UVULOPALATOPHARYNGOPLASTY  2002   sleep apnea completely cured now    Social History:  reports that he quit smoking about 13 years ago. He has never used smokeless tobacco. He reports that he does not drink alcohol or use drugs.  Allergies:  Allergies  Allergen Reactions  . Cephalexin Rash  . Ace Inhibitors Rash  . Bactrim [Sulfamethoxazole-Trimethoprim] Itching and Rash  . Doxycycline Rash    Medications: No current facility-administered medications for this encounter.    Current Outpatient Medications  Medication Sig Dispense Refill  . amLODipine (NORVASC) 2.5 MG tablet Take 2.5 mg by mouth daily.    Marland Kitchen aspirin EC 81 MG tablet Take 81 mg by mouth daily.    Marland Kitchen atorvastatin (LIPITOR) 20 MG tablet Take 20 mg daily by mouth.    . carvedilol (COREG) 12.5 MG tablet Take 12.5 mg by mouth 2 (two) times daily.    . Multiple Vitamin (MULTIVITAMIN WITH MINERALS) TABS tablet Take 1 tablet by mouth daily.    . Vitamin D, Ergocalciferol, (DRISDOL) 50000 units CAPS capsule Take 50,000 Units by mouth every Saturday.    Marland Kitchen amoxicillin (AMOXIL) 500 MG capsule Take 500 mg by mouth 3 (three) times daily.    Marland Kitchen HYDROcodone-acetaminophen (NORCO/VICODIN) 5-325 MG tablet Take 1 tablet by mouth every 8 (eight) hours as needed for moderate pain (right hip pain).    . polyethylene glycol (MIRALAX / GLYCOLAX) packet Take 17 g by mouth 2 (two) times daily. (Patient not taking: Reported on 05/06/2018) 14 each 0    Results for orders placed or performed during the hospital encounter of 05/07/18 (from the past 48 hour(s))  Basic metabolic panel     Status: Abnormal   Collection Time: 05/07/18  9:50 AM  Result Value Ref Range   Sodium 134 (L) 135 - 145 mmol/L   Potassium 4.4 3.5 - 5.1 mmol/L   Chloride 99 98 - 111 mmol/L   CO2 26 22 - 32 mmol/L    Glucose, Bld 144 (H) 70 - 99 mg/dL   BUN 14 6 - 20 mg/dL   Creatinine, Ser 0.83 0.61 - 1.24 mg/dL   Calcium 9.3 8.9 - 10.3 mg/dL   GFR calc non Af Amer >60 >60 mL/min   GFR calc Af Amer >60 >60 mL/min    Comment: (NOTE) The eGFR has been calculated using the CKD EPI equation. This calculation has not been validated in all clinical situations. eGFR's persistently <60 mL/min signify possible Chronic Kidney Disease.    Anion gap 9 5 - 15  Comment: Performed at [facility], [place] [address] [place], [place] [address]  CBC     Status: Abnormal   Collection Time: 05/07/18  9:50 AM  Result Value Ref Range   WBC 8.4 4.0 - 10.5 K/uL   RBC 3.50 (L) 4.22 - 5.81 MIL/uL   Hemoglobin 11.5 (L) 13.0 - 17.0 g/dL   HCT 33.4 (L) 39.0 - 52.0 %   MCV 95.4 78.0 - 100.0 fL   MCH 32.9 26.0 - 34.0 pg   MCHC 34.4 30.0 - 36.0 g/dL   RDW 13.9 11.5 - 15.5 %   Platelets 211 150 - 400 K/uL    Comment: Performed at [facility], [place] [address], [place], [place] [address]      Review of Systems  Constitutional: Positive for chills and fever.  HENT: Negative.   Eyes: Negative.   Respiratory: Negative.   Cardiovascular: Negative.   Gastrointestinal: Negative.   Genitourinary: Negative.   Musculoskeletal: Positive for joint pain.  Skin: Negative.   Neurological: Negative.   Endo/Heme/Allergies: Negative.   Psychiatric/Behavioral: Negative.        Physical Exam  Constitutional: He is oriented to person, place, and time. He appears well-developed.  HENT:  Head: Normocephalic.  Eyes: Pupils are equal, round, and reactive to light.  Neck: Neck supple. No JVD present. No tracheal deviation present. No thyromegaly present.  Cardiovascular: Normal rate, regular rhythm and intact distal pulses.  Respiratory: Effort normal and breath sounds normal. No respiratory distress. He has no wheezes.  GI: Soft. There is no tenderness. There is no guarding.    Musculoskeletal:       Right hip: He exhibits decreased range of motion, decreased strength, tenderness and bony tenderness. He exhibits no swelling, no deformity and no laceration (healed previous incision).  Lymphadenopathy:    He has no cervical adenopathy.  Neurological: He is alert and oriented to person, place, and time.  Skin: Skin is warm and dry.  Psychiatric: He has a normal mood and affect.       Assessment/Plan Assessment: Acute right total hip infection  Plan: Patient will undergo a I&D of the right hip with exchange of the femoral head and liner on 05/12/2018 per Dr. Alvan Dame at [facility]. Risks benefits and expectations were discussed with the patient. Patient understand risks, benefits and expectations and wishes to proceed.   West Pugh Babish   PA-C  05/08/2018, 4:21 PM

## 2018-05-11 ENCOUNTER — Telehealth (HOSPITAL_COMMUNITY): Payer: Self-pay | Admitting: *Deleted

## 2018-05-12 ENCOUNTER — Inpatient Hospital Stay: Payer: Self-pay

## 2018-05-12 ENCOUNTER — Inpatient Hospital Stay (HOSPITAL_COMMUNITY)
Admission: RE | Admit: 2018-05-12 | Discharge: 2018-05-15 | DRG: 468 | Disposition: A | Discharge status: Home / Self Care | Payer: Worker's Compensation | Attending: Orthopedic Surgery | Admitting: Orthopedic Surgery

## 2018-05-12 ENCOUNTER — Other Ambulatory Visit: Payer: Self-pay

## 2018-05-12 ENCOUNTER — Inpatient Hospital Stay (HOSPITAL_COMMUNITY): Payer: Worker's Compensation | Admitting: Anesthesiology

## 2018-05-12 ENCOUNTER — Encounter (HOSPITAL_COMMUNITY)
Admission: RE | Disposition: A | Discharge status: Home / Self Care | Payer: Self-pay | Source: Home / Self Care | Attending: Orthopedic Surgery

## 2018-05-12 ENCOUNTER — Encounter (HOSPITAL_COMMUNITY): Payer: Self-pay

## 2018-05-12 ENCOUNTER — Inpatient Hospital Stay (HOSPITAL_COMMUNITY): Payer: Worker's Compensation

## 2018-05-12 DIAGNOSIS — Z7982 Long term (current) use of aspirin: Secondary | ICD-10-CM | POA: Diagnosis not present

## 2018-05-12 DIAGNOSIS — Z882 Allergy status to sulfonamides status: Secondary | ICD-10-CM | POA: Diagnosis not present

## 2018-05-12 DIAGNOSIS — B9561 Methicillin susceptible Staphylococcus aureus infection as the cause of diseases classified elsewhere: Secondary | ICD-10-CM | POA: Diagnosis present

## 2018-05-12 DIAGNOSIS — Y831 Surgical operation with implant of artificial internal device as the cause of abnormal reaction of the patient, or of later complication, without mention of misadventure at the time of the procedure: Secondary | ICD-10-CM | POA: Diagnosis present

## 2018-05-12 DIAGNOSIS — Z8619 Personal history of other infectious and parasitic diseases: Secondary | ICD-10-CM | POA: Diagnosis not present

## 2018-05-12 DIAGNOSIS — I10 Essential (primary) hypertension: Secondary | ICD-10-CM

## 2018-05-12 DIAGNOSIS — R509 Fever, unspecified: Secondary | ICD-10-CM | POA: Diagnosis present

## 2018-05-12 DIAGNOSIS — T8451XD Infection and inflammatory reaction due to internal right hip prosthesis, subsequent encounter: Secondary | ICD-10-CM | POA: Diagnosis not present

## 2018-05-12 DIAGNOSIS — Z96649 Presence of unspecified artificial hip joint: Secondary | ICD-10-CM

## 2018-05-12 DIAGNOSIS — Z888 Allergy status to other drugs, medicaments and biological substances status: Secondary | ICD-10-CM

## 2018-05-12 DIAGNOSIS — Z87891 Personal history of nicotine dependence: Secondary | ICD-10-CM

## 2018-05-12 DIAGNOSIS — M25551 Pain in right hip: Secondary | ICD-10-CM | POA: Diagnosis not present

## 2018-05-12 DIAGNOSIS — M1991 Primary osteoarthritis, unspecified site: Secondary | ICD-10-CM | POA: Diagnosis present

## 2018-05-12 DIAGNOSIS — T8451XA Infection and inflammatory reaction due to internal right hip prosthesis, initial encounter: Secondary | ICD-10-CM | POA: Diagnosis present

## 2018-05-12 DIAGNOSIS — T8459XA Infection and inflammatory reaction due to other internal joint prosthesis, initial encounter: Secondary | ICD-10-CM

## 2018-05-12 DIAGNOSIS — Z881 Allergy status to other antibiotic agents status: Secondary | ICD-10-CM | POA: Diagnosis not present

## 2018-05-12 DIAGNOSIS — K5900 Constipation, unspecified: Secondary | ICD-10-CM | POA: Diagnosis not present

## 2018-05-12 DIAGNOSIS — Z79899 Other long term (current) drug therapy: Secondary | ICD-10-CM | POA: Diagnosis not present

## 2018-05-12 DIAGNOSIS — M00051 Staphylococcal arthritis, right hip: Secondary | ICD-10-CM | POA: Diagnosis not present

## 2018-05-12 HISTORY — PX: INCISION AND DRAINAGE HIP: SHX1801

## 2018-05-12 LAB — TYPE AND SCREEN
ABO/RH(D): A NEG
Antibody Screen: NEGATIVE

## 2018-05-12 LAB — SEDIMENTATION RATE: Sed Rate: 84 mm/hr — ABNORMAL HIGH (ref 0–16)

## 2018-05-12 LAB — C-REACTIVE PROTEIN: CRP: 12.8 mg/dL — ABNORMAL HIGH (ref <1.0)

## 2018-05-12 LAB — CK: Total CK: 90 U/L (ref 49–397)

## 2018-05-12 SURGERY — IRRIGATION AND DEBRIDEMENT HIP WITH POLY EXCHANGE
Anesthesia: General | Site: Hip | Laterality: Right

## 2018-05-12 MED ORDER — FERROUS SULFATE 325 (65 FE) MG PO TABS
325.0000 mg | ORAL_TABLET | Freq: Three times a day (TID) | ORAL | Status: DC
  Administered 2018-05-12 – 2018-05-15 (×9): 325 mg via ORAL
  Filled 2018-05-12 (×9): qty 1

## 2018-05-12 MED ORDER — VANCOMYCIN HCL 1000 MG IV SOLR
INTRAVENOUS | Status: DC | PRN
  Administered 2018-05-12: 2000 mg via TOPICAL

## 2018-05-12 MED ORDER — SODIUM CHLORIDE 0.9 % IJ SOLN
INTRAMUSCULAR | Status: AC
  Filled 2018-05-12: qty 10

## 2018-05-12 MED ORDER — METHOCARBAMOL 500 MG PO TABS
500.0000 mg | ORAL_TABLET | Freq: Four times a day (QID) | ORAL | Status: DC | PRN
  Administered 2018-05-13 (×2): 500 mg via ORAL
  Filled 2018-05-12 (×3): qty 1

## 2018-05-12 MED ORDER — SCOPOLAMINE 1 MG/3DAYS TD PT72
MEDICATED_PATCH | TRANSDERMAL | Status: AC
  Filled 2018-05-12: qty 1

## 2018-05-12 MED ORDER — MENTHOL 3 MG MT LOZG: 1.0000 | LOZENGE | OROMUCOSAL | Status: DC | PRN

## 2018-05-12 MED ORDER — ALBUMIN HUMAN 5 % IV SOLN
INTRAVENOUS | Status: DC | PRN
  Administered 2018-05-12: 16:00:00 via INTRAVENOUS

## 2018-05-12 MED ORDER — MORPHINE SULFATE (PF) 2 MG/ML IV SOLN: 0.5000 mg | INTRAVENOUS | Status: DC | PRN

## 2018-05-12 MED ORDER — LACTATED RINGERS IV SOLN
INTRAVENOUS | Status: DC
  Administered 2018-05-12: 15:00:00 via INTRAVENOUS
  Administered 2018-05-12: 1000 mL via INTRAVENOUS
  Administered 2018-05-12: 16:00:00 via INTRAVENOUS

## 2018-05-12 MED ORDER — VANCOMYCIN HCL IN DEXTROSE 1-5 GM/200ML-% IV SOLN
1000.0000 mg | Freq: Once | INTRAVENOUS | Status: AC
  Administered 2018-05-12: 1000 mg via INTRAVENOUS
  Filled 2018-05-12: qty 200

## 2018-05-12 MED ORDER — DEXAMETHASONE SODIUM PHOSPHATE 10 MG/ML IJ SOLN
INTRAMUSCULAR | Status: AC
  Filled 2018-05-12: qty 1

## 2018-05-12 MED ORDER — SODIUM CHLORIDE 0.9 % IR SOLN
Status: DC | PRN
  Administered 2018-05-12: 6000 mL

## 2018-05-12 MED ORDER — MAGNESIUM CITRATE PO SOLN: 1.0000 | Freq: Once | ORAL | Status: DC | PRN

## 2018-05-12 MED ORDER — PHENOL 1.4 % MT LIQD: 1.0000 | OROMUCOSAL | Status: DC | PRN

## 2018-05-12 MED ORDER — CHLORHEXIDINE GLUCONATE 4 % EX LIQD: 60.0000 mL | Freq: Once | CUTANEOUS | Status: DC

## 2018-05-12 MED ORDER — ATORVASTATIN CALCIUM 20 MG PO TABS: 20.0000 mg | ORAL_TABLET | Freq: Every day | ORAL | Status: DC

## 2018-05-12 MED ORDER — METOCLOPRAMIDE HCL 5 MG PO TABS: 5.0000 mg | ORAL_TABLET | Freq: Three times a day (TID) | ORAL | Status: DC | PRN

## 2018-05-12 MED ORDER — METOCLOPRAMIDE HCL 5 MG/ML IJ SOLN: 5.0000 mg | Freq: Three times a day (TID) | INTRAMUSCULAR | Status: DC | PRN

## 2018-05-12 MED ORDER — HYDROMORPHONE HCL 1 MG/ML IJ SOLN
INTRAMUSCULAR | Status: AC
  Administered 2018-05-12: 0.5 mg via INTRAVENOUS
  Filled 2018-05-12: qty 2

## 2018-05-12 MED ORDER — HYDROCODONE-ACETAMINOPHEN 7.5-325 MG PO TABS
1.0000 | ORAL_TABLET | ORAL | Status: DC | PRN
  Administered 2018-05-13 – 2018-05-14 (×2): 1 via ORAL
  Administered 2018-05-14: 2 via ORAL
  Administered 2018-05-14 – 2018-05-15 (×5): 1 via ORAL
  Filled 2018-05-12 (×6): qty 1
  Filled 2018-05-12: qty 2
  Filled 2018-05-12 (×2): qty 1

## 2018-05-12 MED ORDER — LIDOCAINE 2% (20 MG/ML) 5 ML SYRINGE
INTRAMUSCULAR | Status: DC | PRN
  Administered 2018-05-12: 25 mg via INTRAVENOUS
  Administered 2018-05-12: 75 mg via INTRAVENOUS

## 2018-05-12 MED ORDER — HYDROMORPHONE HCL 2 MG/ML IJ SOLN
INTRAMUSCULAR | Status: AC
  Filled 2018-05-12: qty 1

## 2018-05-12 MED ORDER — ONDANSETRON HCL 4 MG/2ML IJ SOLN
4.0000 mg | Freq: Four times a day (QID) | INTRAMUSCULAR | Status: DC | PRN
Start: 2018-05-12 — End: 2018-05-15

## 2018-05-12 MED ORDER — MIDAZOLAM HCL 5 MG/5ML IJ SOLN
INTRAMUSCULAR | Status: DC | PRN
  Administered 2018-05-12: 2 mg via INTRAVENOUS

## 2018-05-12 MED ORDER — ALUM & MAG HYDROXIDE-SIMETH 200-200-20 MG/5ML PO SUSP: 15.0000 mL | ORAL | Status: DC | PRN

## 2018-05-12 MED ORDER — ONDANSETRON HCL 4 MG/2ML IJ SOLN
INTRAMUSCULAR | Status: DC | PRN
  Administered 2018-05-12: 4 mg via INTRAVENOUS

## 2018-05-12 MED ORDER — ONDANSETRON HCL 4 MG PO TABS: 4.0000 mg | ORAL_TABLET | Freq: Four times a day (QID) | ORAL | Status: DC | PRN

## 2018-05-12 MED ORDER — SODIUM CHLORIDE 0.9 % IV SOLN
600.0000 mg | INTRAVENOUS | Status: DC
  Administered 2018-05-12: 600 mg via INTRAVENOUS
  Filled 2018-05-12 (×2): qty 12

## 2018-05-12 MED ORDER — TRANEXAMIC ACID 1000 MG/10ML IV SOLN
1000.0000 mg | INTRAVENOUS | Status: AC
  Administered 2018-05-12: 1000 mg via INTRAVENOUS

## 2018-05-12 MED ORDER — LIDOCAINE 2% (20 MG/ML) 5 ML SYRINGE
INTRAMUSCULAR | Status: AC
  Filled 2018-05-12: qty 5

## 2018-05-12 MED ORDER — ACETAMINOPHEN 325 MG PO TABS: 325.0000 mg | ORAL_TABLET | Freq: Four times a day (QID) | ORAL | Status: DC | PRN

## 2018-05-12 MED ORDER — SODIUM CHLORIDE 0.9 % IV SOLN
INTRAVENOUS | Status: DC
  Administered 2018-05-12 – 2018-05-14 (×2): via INTRAVENOUS

## 2018-05-12 MED ORDER — TRANEXAMIC ACID 1000 MG/10ML IV SOLN
1000.0000 mg | Freq: Once | INTRAVENOUS | Status: AC
  Administered 2018-05-12: 1000 mg via INTRAVENOUS
  Filled 2018-05-12: qty 10

## 2018-05-12 MED ORDER — DOCUSATE SODIUM 100 MG PO CAPS
100.0000 mg | ORAL_CAPSULE | Freq: Two times a day (BID) | ORAL | Status: DC
  Administered 2018-05-12 – 2018-05-13 (×2): 100 mg via ORAL
  Filled 2018-05-12 (×5): qty 1

## 2018-05-12 MED ORDER — BISACODYL 10 MG RE SUPP: 10.0000 mg | Freq: Every day | RECTAL | Status: DC | PRN

## 2018-05-12 MED ORDER — ONDANSETRON HCL 4 MG/2ML IJ SOLN
INTRAMUSCULAR | Status: AC
  Filled 2018-05-12: qty 2

## 2018-05-12 MED ORDER — HYDROCODONE-ACETAMINOPHEN 5-325 MG PO TABS
1.0000 | ORAL_TABLET | ORAL | Status: DC | PRN
  Administered 2018-05-12 – 2018-05-13 (×5): 1 via ORAL
  Filled 2018-05-12 (×5): qty 1

## 2018-05-12 MED ORDER — PROPOFOL 10 MG/ML IV BOLUS
INTRAVENOUS | Status: DC | PRN
  Administered 2018-05-12: 160 mg via INTRAVENOUS

## 2018-05-12 MED ORDER — ROCURONIUM BROMIDE 10 MG/ML (PF) SYRINGE
PREFILLED_SYRINGE | INTRAVENOUS | Status: AC
  Filled 2018-05-12: qty 10

## 2018-05-12 MED ORDER — SODIUM CHLORIDE 0.9 % IR SOLN
Status: DC | PRN
  Administered 2018-05-12: 1000 mL

## 2018-05-12 MED ORDER — VANCOMYCIN HCL 1000 MG IV SOLR
INTRAVENOUS | Status: AC
  Filled 2018-05-12: qty 1000

## 2018-05-12 MED ORDER — DEXAMETHASONE SODIUM PHOSPHATE 10 MG/ML IJ SOLN: 10.0000 mg | Freq: Once | INTRAMUSCULAR | Status: DC

## 2018-05-12 MED ORDER — TRAMADOL HCL 50 MG PO TABS
50.0000 mg | ORAL_TABLET | Freq: Four times a day (QID) | ORAL | Status: DC
  Administered 2018-05-12 – 2018-05-15 (×9): 50 mg via ORAL
  Filled 2018-05-12 (×10): qty 1

## 2018-05-12 MED ORDER — ASPIRIN 81 MG PO CHEW
81.0000 mg | CHEWABLE_TABLET | Freq: Two times a day (BID) | ORAL | Status: DC
  Administered 2018-05-12 – 2018-05-15 (×6): 81 mg via ORAL
  Filled 2018-05-12 (×6): qty 1

## 2018-05-12 MED ORDER — DEXAMETHASONE SODIUM PHOSPHATE 10 MG/ML IJ SOLN
10.0000 mg | Freq: Once | INTRAMUSCULAR | Status: AC
  Administered 2018-05-13: 10 mg via INTRAVENOUS
  Filled 2018-05-12: qty 1

## 2018-05-12 MED ORDER — SUFENTANIL CITRATE 50 MCG/ML IV SOLN
INTRAVENOUS | Status: AC
Start: 2018-05-12 — End: ?
  Filled 2018-05-12: qty 1

## 2018-05-12 MED ORDER — DIPHENHYDRAMINE HCL 12.5 MG/5ML PO ELIX: 12.5000 mg | ORAL_SOLUTION | ORAL | Status: DC | PRN

## 2018-05-12 MED ORDER — CARVEDILOL 12.5 MG PO TABS
12.5000 mg | ORAL_TABLET | Freq: Two times a day (BID) | ORAL | Status: DC
  Administered 2018-05-12 – 2018-05-14 (×3): 12.5 mg via ORAL
  Filled 2018-05-12 (×5): qty 1

## 2018-05-12 MED ORDER — HYDROMORPHONE HCL 1 MG/ML IJ SOLN
INTRAMUSCULAR | Status: DC | PRN
  Administered 2018-05-12: 0.5 mg via INTRAVENOUS
  Administered 2018-05-12: .5 mg via INTRAVENOUS
  Administered 2018-05-12 (×2): 0.5 mg via INTRAVENOUS

## 2018-05-12 MED ORDER — DEXAMETHASONE SODIUM PHOSPHATE 10 MG/ML IJ SOLN
INTRAMUSCULAR | Status: DC | PRN
  Administered 2018-05-12: 10 mg via INTRAVENOUS

## 2018-05-12 MED ORDER — SODIUM CHLORIDE 0.9 % IV SOLN
INTRAVENOUS | Status: DC | PRN
  Administered 2018-05-12: 25 ug/min via INTRAVENOUS

## 2018-05-12 MED ORDER — SUFENTANIL CITRATE 50 MCG/ML IV SOLN
INTRAVENOUS | Status: DC | PRN
  Administered 2018-05-12 (×2): 5 ug via INTRAVENOUS
  Administered 2018-05-12 (×2): 10 ug via INTRAVENOUS
  Administered 2018-05-12 (×2): 5 ug via INTRAVENOUS
  Administered 2018-05-12: 10 ug via INTRAVENOUS

## 2018-05-12 MED ORDER — SUGAMMADEX SODIUM 200 MG/2ML IV SOLN
INTRAVENOUS | Status: DC | PRN
  Administered 2018-05-12: 200 mg via INTRAVENOUS

## 2018-05-12 MED ORDER — ROCURONIUM BROMIDE 10 MG/ML (PF) SYRINGE
PREFILLED_SYRINGE | INTRAVENOUS | Status: DC | PRN
  Administered 2018-05-12: 60 mg via INTRAVENOUS

## 2018-05-12 MED ORDER — POLYETHYLENE GLYCOL 3350 17 G PO PACK
17.0000 g | PACK | Freq: Two times a day (BID) | ORAL | Status: DC
  Administered 2018-05-12 – 2018-05-13 (×2): 17 g via ORAL
  Filled 2018-05-12 (×5): qty 1

## 2018-05-12 MED ORDER — SCOPOLAMINE 1 MG/3DAYS TD PT72
1.0000 | MEDICATED_PATCH | Freq: Once | TRANSDERMAL | Status: DC
  Administered 2018-05-12: 1.5 mg via TRANSDERMAL

## 2018-05-12 MED ORDER — HYDROMORPHONE HCL 1 MG/ML IJ SOLN
0.2500 mg | INTRAMUSCULAR | Status: DC | PRN
  Administered 2018-05-12 (×2): 0.5 mg via INTRAVENOUS

## 2018-05-12 MED ORDER — PROPOFOL 10 MG/ML IV BOLUS
INTRAVENOUS | Status: AC
  Filled 2018-05-12: qty 20

## 2018-05-12 MED ORDER — AMLODIPINE BESYLATE 5 MG PO TABS
2.5000 mg | ORAL_TABLET | Freq: Every day | ORAL | Status: DC
  Administered 2018-05-13 – 2018-05-15 (×3): 2.5 mg via ORAL
  Filled 2018-05-12 (×3): qty 1

## 2018-05-12 MED ORDER — SUGAMMADEX SODIUM 200 MG/2ML IV SOLN
INTRAVENOUS | Status: AC
  Filled 2018-05-12: qty 2

## 2018-05-12 MED ORDER — METHOCARBAMOL 500 MG IVPB - SIMPLE MED
500.0000 mg | Freq: Four times a day (QID) | INTRAVENOUS | Status: DC | PRN
  Administered 2018-05-12: 500 mg via INTRAVENOUS
  Filled 2018-05-12: qty 50

## 2018-05-12 MED ORDER — METHOCARBAMOL 500 MG IVPB - SIMPLE MED
INTRAVENOUS | Status: AC
  Administered 2018-05-12: 500 mg via INTRAVENOUS
  Filled 2018-05-12: qty 50

## 2018-05-12 SURGICAL SUPPLY — 41 items
COVER SURGICAL LIGHT HANDLE (MISCELLANEOUS) ×2 IMPLANT
DERMABOND ADVANCED (GAUZE/BANDAGES/DRESSINGS) ×1
DERMABOND ADVANCED .7 DNX12 (GAUZE/BANDAGES/DRESSINGS) ×1 IMPLANT
DRAPE ORTHO SPLIT 77X108 STRL (DRAPES) ×2
DRAPE POUCH INSTRU U-SHP 10X18 (DRAPES) ×2 IMPLANT
DRAPE SURG 17X11 SM STRL (DRAPES) ×2 IMPLANT
DRAPE SURG ORHT 6 SPLT 77X108 (DRAPES) ×2 IMPLANT
DRAPE U-SHAPE 47X51 STRL (DRAPES) ×2 IMPLANT
DRESSING AQUACEL AG SP 3.5X10 (GAUZE/BANDAGES/DRESSINGS) ×1 IMPLANT
DRSG AQUACEL AG SP 3.5X10 (GAUZE/BANDAGES/DRESSINGS) ×2
DURAPREP 26ML APPLICATOR (WOUND CARE) ×2 IMPLANT
ELECT BLADE TIP CTD 4 INCH (ELECTRODE) ×2 IMPLANT
ELECT REM PT RETURN 15FT ADLT (MISCELLANEOUS) ×2 IMPLANT
EVACUATOR 1/8 PVC DRAIN (DRAIN) IMPLANT
FACESHIELD WRAPAROUND (MASK) ×8 IMPLANT
GLOVE BIOGEL PI IND STRL 7.5 (GLOVE) ×4 IMPLANT
GLOVE BIOGEL PI IND STRL 8.5 (GLOVE) ×1 IMPLANT
GLOVE BIOGEL PI INDICATOR 7.5 (GLOVE) ×4
GLOVE BIOGEL PI INDICATOR 8.5 (GLOVE) ×1
GLOVE ECLIPSE 8.0 STRL XLNG CF (GLOVE) ×4 IMPLANT
GLOVE ORTHO TXT STRL SZ7.5 (GLOVE) ×4 IMPLANT
GOWN STRL REUS W/TWL LRG LVL3 (GOWN DISPOSABLE) ×2 IMPLANT
GOWN STRL REUS W/TWL XL LVL3 (GOWN DISPOSABLE) ×4 IMPLANT
HEAD FEM DLT TS CER 36X+5.0 (Head) ×2 IMPLANT
KIT BASIN OR (CUSTOM PROCEDURE TRAY) ×2 IMPLANT
LINER NEUTRAL 64X36 P4 (Hips) ×2 IMPLANT
MANIFOLD NEPTUNE II (INSTRUMENTS) ×2 IMPLANT
NS IRRIG 1000ML POUR BTL (IV SOLUTION) ×2 IMPLANT
PACK TOTAL JOINT (CUSTOM PROCEDURE TRAY) ×2 IMPLANT
POSITIONER SURGICAL ARM (MISCELLANEOUS) ×2 IMPLANT
SUCTION FRAZIER HANDLE 10FR (MISCELLANEOUS) ×1
SUCTION TUBE FRAZIER 10FR DISP (MISCELLANEOUS) ×1 IMPLANT
SUT MNCRL AB 3-0 PS2 27 (SUTURE) ×4 IMPLANT
SUT VIC AB 1 CT1 36 (SUTURE) ×4 IMPLANT
SUT VIC AB 2-0 CT1 27 (SUTURE) ×2
SUT VIC AB 2-0 CT1 TAPERPNT 27 (SUTURE) ×2 IMPLANT
SWAB COLLECTION DEVICE MRSA (MISCELLANEOUS) ×2 IMPLANT
SWAB CULTURE ESWAB REG 1ML (MISCELLANEOUS) ×2 IMPLANT
TOWEL OR 17X26 10 PK STRL BLUE (TOWEL DISPOSABLE) ×4 IMPLANT
TRAY FOLEY MTR SLVR 16FR STAT (SET/KITS/TRAYS/PACK) ×2 IMPLANT
WATER STERILE IRR 1000ML POUR (IV SOLUTION) ×2 IMPLANT

## 2018-05-12 NOTE — Consult Note (Addendum)
[facility] for Infectious Disease    Date of Admission:  05/12/2018   Total days of antibiotics: 0               Reason for Consult: Prosthetic Joint Infection, R hip    Referring Provider: Alvan Dame   Assessment: Prosthetic Joint Infection, R hip  Plan: 1. Restart dapto 2. Check CK 3. Await his Cx 4. Recheck ESR and CRP 5. Place pic and plan for long term IV anbx (6 weeks) 6. Will consider prolonged po anbx (year or more) at his ID f/u appt.   Thank you so much for this interesting consult,  Principal Problem:   Right prosthetic hip infection   . chlorhexidine  60 mL Topical Once  . dexamethasone  10 mg Intravenous Once  . scopolamine  1 patch Transdermal Once  . scopolamine        HPI: [name] is a 57 y.o. 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  On 12-2016 I & D of R THR as well as exchange of parts. His course was complicated by hematoma. His BCx and his joint Cx grew MSSA.  He was sent home with dapto-rifampin but returned on 4-30 due to worsening drainage. He underwent repeat I & D. He was to complete 6 weeks of dapto-rifampin and was then changed to bactrim-rifampin.   He developed worsening pain and had loosening on his films on the left. He underwent L hip revision arthroplasty on 7-30. His Cx grew MSSA (R- rifampin). He was treated with daptomycin for 6 weeks then transitioned to bactrim for 4.5 months. He had f/u in ID clinic (last 10-2017) and was doing well. His bactrim was stopped at that time. ESR 6 and CRP 1.4.   Beginning 7-21 he developed fever to 102 and worsening pain in his R hip. He restarted bactrim that he had at home however developed a rash immediately. He then started clindamycin until he had f/u with ortho when he started amoxil.   He is afebrile, his last WBC is normal.  He denies any recent injury to his RLE.  He has had no problems with his LLE.   Review of  Systems: Review of Systems  Constitutional: Positive for fever.  Respiratory: Negative for cough and shortness of breath.   Gastrointestinal: Positive for constipation. Negative for diarrhea.  Genitourinary: Negative for dysuria.  Please see HPI. All other systems reviewed and negative.   Past Medical History:  Diagnosis Date  . Arthritis   . Bruit of right carotid artery    diagnosed August 2018  . Cancer ([place])    basal cell CA removed from both legs  . History of blood transfusion   . Hypertension   . PONV (postoperative nausea and vomiting)   . Sleep apnea    hx of had surgery for sleep apnea    Social History   Tobacco Use  . Smoking status: Former Smoker    Last attempt to quit: 10/14/2004    Years since quitting: 13.5  . Smokeless tobacco: Never Used  Substance Use Topics  . Alcohol use: No    Alcohol/week: 1.2 oz    Types: 2 Cans of beer per week  . Drug use: No    Family History  Problem Relation Age of Onset  . COPD Mother   . Alzheimer's disease Father  Medications:  Scheduled: . scopolamine  1 patch Transdermal Once  . scopolamine        Abtx:  Anti-infectives (From admission, onward)   Start     Dose/Rate Route Frequency Ordered Stop   05/12/18 1525  vancomycin (VANCOCIN) powder  Status:  Discontinued       As needed 05/12/18 1525 05/12/18 1602   05/12/18 1145  vancomycin (VANCOCIN) IVPB 1000 mg/200 mL premix     1,000 mg 200 mL/hr over 60 Minutes Intravenous  Once 05/12/18 1147 05/12/18 1552        OBJECTIVE: Blood pressure 124/60, pulse 62, temperature 98.3 F (36.8 C), resp. rate 16, height 5' 7.5" (1.715 m), weight 99.3 kg (219 lb), SpO2 97 %.  Physical Exam  Constitutional: He is oriented to person, place, and time. He appears well-developed and well-nourished.  HENT:  Head: Normocephalic and atraumatic.  Mouth/Throat: No oropharyngeal exudate.  Eyes: Pupils are equal, round, and reactive to light. EOM are normal.  Neck:  Normal range of motion. Neck supple.  Cardiovascular: Normal rate, regular rhythm and normal heart sounds.  Pulmonary/Chest: Effort normal and breath sounds normal.  Abdominal: Soft. Bowel sounds are normal. He exhibits no distension. There is no tenderness.  Musculoskeletal: He exhibits no edema.       Legs: Lymphadenopathy:    He has no cervical adenopathy.  Neurological: He is alert and oriented to person, place, and time.  Psychiatric: He has a normal mood and affect.    Lab Results Results for orders placed or performed during the hospital encounter of 05/12/18 (from the past 48 hour(s))  Type and screen Order type and screen if day of surgery is less than 15 days from draw of preadmission visit or order morning of surgery if day of surgery is greater than 6 days from preadmission visit.     Status: None   Collection Time: 05/12/18 11:24 AM  Result Value Ref Range   ABO/RH(D) A NEG    Antibody Screen NEG    Sample Expiration      05/15/2018 Performed at [facility], [place] [address], [place], [place] [address]   Aerobic/Anaerobic Culture (surgical/deep wound)     Status: None (Preliminary result)   Collection Time: 05/12/18  2:41 PM  Result Value Ref Range   Specimen Description HIP RIGHT    Special Requests SOFT TISSUE FLUID    Gram Stain      FEW WBC PRESENT, PREDOMINANTLY PMN NO ORGANISMS SEEN Gram Stain Report Called to,Read Back By and Verified With: DR OLIN AT 1531 ON 05/12/18 BY N.THOMPSON Performed at [facility], [place] [address], [place], [place] [address]    Culture PENDING    Report Status PENDING       Component Value Date/Time   SDES HIP RIGHT 05/12/2018 1441   SPECREQUEST SOFT TISSUE FLUID 05/12/2018 1441   CULT PENDING 05/12/2018 1441   REPTSTATUS PENDING 05/12/2018 1441   Dg Pelvis Portable  Result Date: 05/12/2018 CLINICAL DATA:  Right hip revision EXAM: PORTABLE PELVIS 1-2 VIEWS COMPARISON:  05/12/2017 FINDINGS:  Status post bilateral hip replacements. Normal alignment and intact hardware. Small amount of soft tissue gas about the right hip consistent with history of revision. Pubic symphysis and rami are intact. IMPRESSION: Status post right hip revision with postoperative air in the soft tissues Electronically Signed   By: Donavan Foil M.D.   On: 05/12/2018 16:43   Recent Results (from the past 240 hour(s))  Aerobic/Anaerobic Culture (  surgical/deep wound)     Status: None (Preliminary result)   Collection Time: 05/12/18  2:41 PM  Result Value Ref Range Status   Specimen Description HIP RIGHT  Final   Special Requests SOFT TISSUE FLUID  Final   Gram Stain   Final    FEW WBC PRESENT, PREDOMINANTLY PMN NO ORGANISMS SEEN Gram Stain Report Called to,Read Back By and Verified With: DR OLIN AT 1531 ON 05/12/18 BY N.THOMPSON Performed at [facility], [place] [address], [place], [place] [address]    Culture PENDING  Incomplete   Report Status PENDING  Incomplete    Microbiology: Recent Results (from the past 240 hour(s))  Aerobic/Anaerobic Culture (surgical/deep wound)     Status: None (Preliminary result)   Collection Time: 05/12/18  2:41 PM  Result Value Ref Range Status   Specimen Description HIP RIGHT  Final   Special Requests SOFT TISSUE FLUID  Final   Gram Stain   Final    FEW WBC PRESENT, PREDOMINANTLY PMN NO ORGANISMS SEEN Gram Stain Report Called to,Read Back By and Verified With: DR OLIN AT 1531 ON 05/12/18 BY N.THOMPSON Performed at [facility], [place] [address] [place], [place] [address]    Culture PENDING  Incomplete   Report Status PENDING  Incomplete    Radiographs and labs were personally reviewed by me.   [name], MD [facility] [place] [facility] [phone] 05/12/2018, 5:10 PM

## 2018-05-12 NOTE — Progress Notes (Signed)
Pharmacy Antibiotic Note  [name] is a 57 y.o. male admitted on 05/12/2018 with prosthetic joint infection.  Pharmacy has been consulted for daptomycin dosing.  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 Has previously been on daptomycin-rifampin and SMX/TMP + rifampin for several months in 2018 and has been followed by ID.  Patient developed pain/fever on 7/21 per ID notes and resumed SMX/TMP but developed a rash. ID is following and initiating daptomycin.   Today, 05/12/18  Afebrile  CK 90  SCr (7/25) = 0.8  Plan:  Daptomycin 600 mg IV daily (6 mg/kg based on total body weight for BMI <35)  CK weekly  Recommend discontinuing atorvastatin while on daptomycin  Follow cultures, renal function, and ID recommendations  Height: 5' 7.5" (171.5 cm) Weight: 219 lb (99.3 kg) IBW/kg (Calculated) : 67.25  Temp (24hrs), Avg:98.2 F (36.8 C), Min:97.6 F (36.4 C), Max:98.8 F (37.1 C)  Recent Labs  Lab 05/07/18 0950  WBC 8.4  CREATININE 0.83    Estimated Creatinine Clearance: 108.6 mL/min (by C-G formula based on SCr of 0.83 mg/dL).    Allergies  Allergen Reactions  . Cephalexin Rash  . Ace Inhibitors Rash  . Bactrim [Sulfamethoxazole-Trimethoprim] Itching and Rash  . Doxycycline Rash    Antimicrobials this admission: daptomycin 7/30 >>   Dose adjustments this admission:  Microbiology results: 7/30 Wound: Sent  Thank you for allowing pharmacy to be a part of this patient's care.  Lenis Noon, PharmD, BCPS Clinical Pharmacist 05/12/2018 7:52 PM

## 2018-05-12 NOTE — Brief Op Note (Signed)
05/12/2018  2:19 PM  PATIENT:  [name]  57 y.o. male  PRE-OPERATIVE DIAGNOSIS:  Acute hematogenous infection right total hip  POST-OPERATIVE DIAGNOSIS:  Acute hematogenous infection right total hip  PROCEDURE:  Procedure(s) with comments: Irrigation and debridement right hip, head/liner exchange (Right) - 90 mins  SURGEON:  Surgeon(s) and Role:    Paralee Cancel, MD - Primary  PHYSICIAN ASSISTANT: Danae Orleans, PA-C  ANESTHESIA:   general  EBL:  450 cc  BLOOD ADMINISTERED:none  DRAINS: none   LOCAL MEDICATIONS USED:  NONE  SPECIMEN:  Source of Specimen:  right hip joint  DISPOSITION OF SPECIMEN:  PATHOLOGY  COUNTS:  YES  TOURNIQUET:  * No tourniquets in log *  DICTATION: .Other Dictation: Dictation Number [id number]  PLAN OF CARE: Admit to inpatient   PATIENT DISPOSITION:  PACU - hemodynamically stable.   Delay start of Pharmacological VTE agent (>24hrs) due to surgical blood loss or risk of bleeding: no

## 2018-05-12 NOTE — Anesthesia Postprocedure Evaluation (Signed)
Anesthesia Post Note  Patient: [place]wood Revoir  Procedure(s) Performed: excisional and nonexcisional debridement right hip, head/liner exchange revison (Right Hip)     Patient location during evaluation: PACU Anesthesia Type: General Level of consciousness: awake and alert Pain management: pain level controlled Vital Signs Assessment: post-procedure vital signs reviewed and stable Respiratory status: spontaneous breathing, nonlabored ventilation, respiratory function stable and patient connected to nasal cannula oxygen Cardiovascular status: blood pressure returned to baseline and stable Postop Assessment: no apparent nausea or vomiting Anesthetic complications: no    Last Vitals:  Vitals:   05/12/18 1645 05/12/18 1655  BP: (!) 107/59 124/60  Pulse: 71 62  Resp: 19 16  Temp: 37.1 C 36.8 C  SpO2: 96% 97%    Last Pain:  Vitals:   05/12/18 1645  TempSrc:   PainSc: 3                  Rico Massar,W. EDMOND

## 2018-05-12 NOTE — Interval H&P Note (Signed)
History and Physical Interval Note:  05/12/2018 11:28 AM  [name]  has presented today for surgery, with the diagnosis of Acute infection right total hip  The various methods of treatment have been discussed with the patient and family. After consideration of risks, benefits and other options for treatment, the patient has consented to  Procedure(s) with comments: Irrigation and debridement right hip, head/liner exchange (Right) - 90 mins as a surgical intervention .  The patient's history has been reviewed, patient examined, no change in status, stable for surgery.  I have reviewed the patient's chart and labs.  Questions were answered to the patient's satisfaction.     [name]

## 2018-05-12 NOTE — Anesthesia Preprocedure Evaluation (Addendum)
Anesthesia Evaluation  Patient identified by MRN, date of birth, ID band Patient awake    Reviewed: Allergy & Precautions, H&P , NPO status , Patient's Chart, lab work & pertinent test results, reviewed documented beta blocker date and time   History of Anesthesia Complications (+) PONV  Airway Mallampati: II  TM Distance: >3 FB Neck ROM: Full    Dental no notable dental hx. (+) Teeth Intact, Dental Advisory Given   Pulmonary neg pulmonary ROS, former smoker,    Pulmonary exam normal breath sounds clear to auscultation       Cardiovascular hypertension, Pt. on medications and Pt. on home beta blockers  Rhythm:Regular Rate:Normal     Neuro/Psych negative neurological ROS  negative psych ROS   GI/Hepatic negative GI ROS, Neg liver ROS,   Endo/Other  negative endocrine ROS  Renal/GU negative Renal ROS  negative genitourinary   Musculoskeletal  (+) Arthritis , Osteoarthritis,    Abdominal   Peds  Hematology negative hematology ROS (+)   Anesthesia Other Findings   Reproductive/Obstetrics negative OB ROS                            Anesthesia Physical Anesthesia Plan  ASA: II  Anesthesia Plan: General   Post-op Pain Management:    Induction: Intravenous  PONV Risk Score and Plan: 4 or greater and Ondansetron, Dexamethasone and Midazolam  Airway Management Planned: Oral ETT  Additional Equipment:   Intra-op Plan:   Post-operative Plan: Extubation in OR  Informed Consent: I have reviewed the patients History and Physical, chart, labs and discussed the procedure including the risks, benefits and alternatives for the proposed anesthesia with the patient or authorized representative who has indicated his/her understanding and acceptance.   Dental advisory given  Plan Discussed with: CRNA  Anesthesia Plan Comments:         Anesthesia Quick Evaluation

## 2018-05-12 NOTE — Anesthesia Procedure Notes (Signed)
Procedure Name: Intubation Date/Time: 05/12/2018 2:09 PM Performed by: Lissa Morales, CRNA Pre-anesthesia Checklist: Patient identified, Emergency Drugs available, Suction available and Patient being monitored Patient Re-evaluated:Patient Re-evaluated prior to induction Oxygen Delivery Method: Circle system utilized Preoxygenation: Pre-oxygenation with 100% oxygen Induction Type: IV induction Ventilation: Mask ventilation without difficulty Laryngoscope Size: Mac and 4 Grade View: Grade II Tube type: Oral Tube size: 7.5 mm Number of attempts: 1 Airway Equipment and Method: Stylet and Oral airway Placement Confirmation: ETT inserted through vocal cords under direct vision,  positive ETCO2 and breath sounds checked- equal and bilateral Secured at: 22 cm Tube secured with: Tape Dental Injury: Teeth and Oropharynx as per pre-operative assessment  Difficulty Due To: Difficult Airway- due to anterior larynx and Difficult Airway- due to limited oral opening Comments: Placed ETT under epiglottis.

## 2018-05-12 NOTE — Transfer of Care (Signed)
Immediate Anesthesia Transfer of Care Note  Patient: [name]  Procedure(s) Performed: excisional and nonexcisional debridement right hip, head/liner exchange revison (Right Hip)  Patient Location: PACU  Anesthesia Type:General  Level of Consciousness: awake, alert , oriented and patient cooperative  Airway & Oxygen Therapy: Patient Spontanous Breathing and Patient connected to face mask oxygen  Post-op Assessment: Report given to RN, Post -op Vital signs reviewed and stable and Patient moving all extremities X 4  Post vital signs: stable  Last Vitals:  Vitals Value Taken Time  BP 153/73 05/12/2018  4:09 PM  Temp    Pulse 71 05/12/2018  4:14 PM  Resp 9 05/12/2018  4:14 PM  SpO2 100 % 05/12/2018  4:14 PM  Vitals shown include unvalidated device data.  Last Pain:  Vitals:   05/12/18 1136  TempSrc:   PainSc: 3       Patients Stated Pain Goal: 5 (62/13/08 6578)  Complications: No apparent anesthesia complications

## 2018-05-12 NOTE — Op Note (Signed)
NAME: [name], [name] MEDICAL RECORD PT:[id number] ACCOUNT [id number] DATE OF BIRTH:May 17, 1961 FACILITY: [facility] LOCATION: [facility] PHYSICIAN:Ayat Drenning Marian Sorrow, MD  OPERATIVE REPORT  DATE OF PROCEDURE:  05/12/2018  PREOPERATIVE DIAGNOSIS:  Acute hematogenous infection, right total hip arthroplasty.  POSTOPERATIVE DIAGNOSIS:  Acute hematogenous infection, right total hip arthroplasty.  PROCEDURE: 1.  Sharp excisional and debridement, right hip from an incision 8 inches long, including the skin, subcutaneous tissue, nonviable pseudocapsule bone. 2.  Nonexcisional debridement, right hip wound with 6 liters normal saline solution. 3.  Revision acetabular liner with a size 64 x 36+4 neutral Altrx liner and a 36+5 TS ceramic ball.  SURGEON:  Paralee Cancel, MD  ASSISTANT:  Danae Orleans, PA-C.  Note that [name] was present for the entirety of the case from preoperative positioning, perioperative management of the operative extremity, general facilitation of the case and primary wound closure.  ANESTHESIA:  General.  BLOOD LOSS:  400 mL.  ESTIMATED BLOOD LOSS:  None.  COMPLICATIONS:  None.  SPECIMENS:  I did send joint fluid on swabs to the lab for evaluation.  INDICATIONS:  The patient is a 57 year old male with a complex history involving both of his hips.  His right hip was treated with revision surgery in the past and most recently in the past, well over a year now, he had an acute infection of his right  hip while I was treating his left hip.  He underwent an I and D and seemed successfully be treated.  He presented to the office last Monday with less than 24 hours of fever and pain in his right hip.  He had an elevated sedimentation rate and C-reactive  protein.  He was scheduled for surgery.  Risks and benefits were reviewed in terms of the procedure that was performed in the past.  This is worker's compensation.  He had been treated in the past with a PICC line and  Cubicin.  DESCRIPTION OF PROCEDURE:  The patient was brought to the operative theater.  Once adequate anesthesia, preoperative antibiotics held until the incision, which was vancomycin.  He was positioned into the left lateral decubitus position with the right hip  up.  The right lower extremity was then prepped and draped in sterile fashion.  A timeout was performed identifying the patient, the planned procedure and extremity.  The patient's old incision was partially used and excised.  I excised sharply  subcutaneous fat tissue and pseudosynovialization of this area.  The iliotibial band and gluteal fascia were then incised for posterior approach to the hip.  Nonviable tissue was excised during this case using the Bovie.  Once we entered into the subfascial layer, there was noted to be a large purulent pocket.  Cultures were taken prior to suctioning the remaining out.  At this point, I spent time in exposure and nonexcisional debridement.  We worked our way down to the  hip joint itself and then excised around the acetabular shell as well as around the proximal femur.  Upon completion of this excisional debridement, I felt that there was enough of a subradical excision as the tissues present were healthy appearing.  Once this was done, we dislocated the hip.  Based on the revision component and the orientation that I  feel like they settled into, the acetabulum has had a lot more forward flexion then normally present.  The hip was dislocated.  I was able then to remove the liner using a drill and screw technique.  Once we had all  the components removed.  We irrigated the hip with 6 liters normal saline solution.  While this was going on, further debridement was carried out as necessary.  Once I completed the nonexcisional debridement, I placed about a gram of  vancomycin powder into the deep portion of the cup and soft tissues.  We then all changed gloves and placed a new final 36 x 64+4 neutral  Altrx liner.  This was impacted with good visualized rim fit.  The final 36+5 delta ceramic ball with a TS liner was  then impacted on a clean and dry trunnion and the hip was reduced.  We placed another 750 mg or so of vancomycin into the deep tissues and then reapproximated the iliotibial band using #1 Vicryl and Stratafix sutures.  The remaining wound was closed in  layers.  The remaining vancomycin was sprinkled into the subcutaneous layer.  The hip was then cleaned, dried and dressed sterilely with surgical glue and Aquacel dressing.  He was then extubated and brought to the recovery room in stable condition,  tolerating the procedure well.  Postoperatively, we will allow him to be weightbearing as tolerated.  We will order a PICC line.  We will have infectious disease reevaluate and consider redoing Cubicin or other antibiotics based on cultures, but  long-term suppression will be important, obviously with his history of his right hip.  TN/NUANCE  D:05/12/2018 T:05/12/2018 JOB:[id number]

## 2018-05-12 NOTE — Plan of Care (Signed)
Patient alert and oriented.  Received from PACU at [facility].  Pain assessed.  Vital signs stable.  Initial neuro check WDL.  Call bell within reach.  Patient currently tolerating clear liquids.  Side rails up x2.  Will continue to monitor.  Problem: Education: Goal: Knowledge of General Education information will improve Description Including pain rating scale, medication(s)/side effects and non-pharmacologic comfort measures Outcome: Progressing   Problem: Health Behavior/Discharge Planning: Goal: Ability to manage health-related needs will improve Outcome: Progressing   Problem: Clinical Measurements: Goal: Ability to maintain clinical measurements within normal limits will improve Outcome: Progressing Goal: Will remain free from infection Outcome: Progressing Goal: Diagnostic test results will improve Outcome: Progressing Goal: Respiratory complications will improve Outcome: Progressing Goal: Cardiovascular complication will be avoided Outcome: Progressing   Problem: Activity: Goal: Risk for activity intolerance will decrease Outcome: Progressing   Problem: Nutrition: Goal: Adequate nutrition will be maintained Outcome: Progressing   Problem: Coping: Goal: Level of anxiety will decrease Outcome: Progressing   Problem: Elimination: Goal: Will not experience complications related to bowel motility Outcome: Progressing Goal: Will not experience complications related to urinary retention Outcome: Progressing   Problem: Pain Managment: Goal: General experience of comfort will improve Outcome: Progressing   Problem: Safety: Goal: Ability to remain free from injury will improve Outcome: Progressing   Problem: Skin Integrity: Goal: Risk for impaired skin integrity will decrease Outcome: Progressing   Problem: Education: Goal: Required Educational Video(s) Outcome: Progressing   Problem: Clinical Measurements: Goal: Postoperative complications will be avoided or  minimized Outcome: Progressing   Problem: Skin Integrity: Goal: Demonstration of wound healing without infection will improve Outcome: Progressing

## 2018-05-13 ENCOUNTER — Encounter (HOSPITAL_COMMUNITY): Payer: Self-pay | Admitting: Orthopedic Surgery

## 2018-05-13 DIAGNOSIS — T8451XD Infection and inflammatory reaction due to internal right hip prosthesis, subsequent encounter: Secondary | ICD-10-CM

## 2018-05-13 LAB — CBC
HCT: 31.6 % — ABNORMAL LOW (ref 39.0–52.0)
Hemoglobin: 10.8 g/dL — ABNORMAL LOW (ref 13.0–17.0)
MCH: 32.9 pg (ref 26.0–34.0)
MCHC: 34.2 g/dL (ref 30.0–36.0)
MCV: 96.3 fL (ref 78.0–100.0)
Platelets: 402 10*3/uL — ABNORMAL HIGH (ref 150–400)
RBC: 3.28 MIL/uL — AB (ref 4.22–5.81)
RDW: 13.2 % (ref 11.5–15.5)
WBC: 10 10*3/uL (ref 4.0–10.5)

## 2018-05-13 LAB — BASIC METABOLIC PANEL
Anion gap: 8 (ref 5–15)
BUN: 10 mg/dL (ref 6–20)
CHLORIDE: 101 mmol/L (ref 98–111)
CO2: 26 mmol/L (ref 22–32)
Calcium: 8.8 mg/dL — ABNORMAL LOW (ref 8.9–10.3)
Creatinine, Ser: 0.65 mg/dL (ref 0.61–1.24)
GFR calc Af Amer: >60 mL/min (ref >60)
GFR calc non Af Amer: >60 mL/min (ref >60)
GLUCOSE: 192 mg/dL — AB (ref 70–99)
POTASSIUM: 4.4 mmol/L (ref 3.5–5.1)
Sodium: 135 mmol/L (ref 135–145)

## 2018-05-13 MED ORDER — SODIUM CHLORIDE 0.9 % IV SOLN
800.0000 mg | INTRAVENOUS | Status: DC
  Administered 2018-05-13 – 2018-05-15 (×3): 800 mg via INTRAVENOUS
  Filled 2018-05-13 (×3): qty 16

## 2018-05-13 MED ORDER — SODIUM CHLORIDE 0.9% FLUSH: 10.0000 mL | INTRAVENOUS | Status: DC | PRN

## 2018-05-13 NOTE — Progress Notes (Signed)
Peripherally Inserted Central Catheter/Midline Placement  The IV Nurse has discussed with the patient and/or persons authorized to consent for the patient, the purpose of this procedure and the potential benefits and risks involved with this procedure.  The benefits include less needle sticks, lab draws from the catheter, and the patient may be discharged home with the catheter. Risks include, but not limited to, infection, bleeding, blood clot (thrombus formation), and puncture of an artery; nerve damage and irregular heartbeat and possibility to perform a PICC exchange if needed/ordered by physician.  Alternatives to this procedure were also discussed.  Bard Power PICC patient education guide, fact sheet on infection prevention and patient information card has been provided to patient /or left at bedside.    PICC/Midline Placement Documentation        [name] 05/13/2018, 6:34 PM

## 2018-05-13 NOTE — Addendum Note (Signed)
Addendum  created 05/13/18 0746 by Lollie Sails, CRNA   Charge Capture section accepted, Visit diagnoses modified

## 2018-05-13 NOTE — Progress Notes (Signed)
Pharmacy Antibiotic Note  [name] is a 57 y.o. male admitted on 05/12/2018 with prosthetic joint infection.  Pharmacy has been consulted for daptomycin dosing.  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 Has previously been on daptomycin-rifampin and SMX/TMP + rifampin for several months in 2018 and has been followed by ID.  Patient developed pain/fever on 7/21 per ID notes and resumed SMX/TMP but developed a rash, and was subsequently started on Clindamycin until he had f/u with ortho when he started Amoxicillin. Patient underwent I&D of right hip with exchange of femoral head and liner on 05/12/2018. ID is following and initiating daptomycin.   Today, 05/13/18  Afebrile  WBC WNL  CK 90 (on 05/12/2018)  SCr = 0.65, CrCl > 100 ml/min   Plan:  Spoke to Dr. Johnnye Sima regarding dosing of Daptomycin-will adjust to 8mg /kg (800mg ) IV q24h (this is dose patient was on previously in 2018).   Weekly CK   D/C atorvastatin while on Daptomycin per d/w Dr. Johnnye Sima (increased risk of rhabdomyolysis).   Follow cultures, renal function, and ID recommendations  Height: 5' 7.5" (171.5 cm) Weight: 219 lb (99.3 kg) IBW/kg (Calculated) : 67.25  Temp (24hrs), Avg:97.9 F (36.6 C), Min:97.3 F (36.3 C), Max:98.8 F (37.1 C)  Recent Labs  Lab 05/07/18 0950 05/13/18 0536  WBC 8.4 10.0  CREATININE 0.83 0.65    Estimated Creatinine Clearance: 112.6 mL/min (by C-G formula based on SCr of 0.65 mg/dL).    Allergies  Allergen Reactions  . Cephalexin Rash  . Ace Inhibitors Rash  . Bactrim [Sulfamethoxazole-Trimethoprim] Itching and Rash  . Doxycycline Rash    Antimicrobials this admission: 7/30 daptomycin >>   Dose adjustments this admission: 7/31: increase from 6mg /kg to 8mg /kg IV q24h   Microbiology results: 7/30 R hip soft tissue: NGTD  Thank you for allowing pharmacy to be a part of this patient's care.   Lindell Spar, PharmD, BCPS Pager:  [phone] 05/13/2018 1:17 PM

## 2018-05-13 NOTE — Progress Notes (Signed)
INFECTIOUS DISEASE PROGRESS NOTE  ID: [name] is a 57 y.o. male with  Principal Problem:   Right prosthetic hip infection  Subjective: No complaints.  No myalgias or joint pain.   Abtx:  Anti-infectives (From admission, onward)   Start     Dose/Rate Route Frequency Ordered Stop   05/13/18 1400  DAPTOmycin (CUBICIN) 800 mg in sodium chloride 0.9 % IVPB     800 mg 232 mL/hr over 30 Minutes Intravenous Every 24 hours 05/13/18 1309     05/12/18 1930  DAPTOmycin (CUBICIN) 600 mg in sodium chloride 0.9 % IVPB  Status:  Discontinued     600 mg 224 mL/hr over 30 Minutes Intravenous Every 24 hours 05/12/18 1807 05/13/18 1309   05/12/18 1525  vancomycin (VANCOCIN) powder  Status:  Discontinued       As needed 05/12/18 1525 05/12/18 1602   05/12/18 1145  vancomycin (VANCOCIN) IVPB 1000 mg/200 mL premix     1,000 mg 200 mL/hr over 60 Minutes Intravenous  Once 05/12/18 1147 05/12/18 1552      Medications:  Scheduled: . amLODipine  2.5 mg Oral Daily  . aspirin  81 mg Oral BID  . carvedilol  12.5 mg Oral BID  . docusate sodium  100 mg Oral BID  . ferrous sulfate  325 mg Oral TID PC  . polyethylene glycol  17 g Oral BID  . traMADol  50 mg Oral Q6H    Objective: Vital signs in last 24 hours: Temp:  [97.3 F (36.3 C)-98.8 F (37.1 C)] 97.7 F (36.5 C) (07/31 1312) Pulse Rate:  [52-80] 55 (07/31 1312) Resp:  [13-19] 16 (07/31 1312) BP: (107-156)/(59-79) 145/62 (07/31 1312) SpO2:  [96 %-100 %] 100 % (07/31 1312)   General appearance: alert, cooperative and no distress Resp: clear to auscultation bilaterally Cardio: regular rate and rhythm GI: normal findings: bowel sounds normal and soft, non-tender Extremities: R hip wound dressed, clean.   Lab Results Recent Labs    05/13/18 0536  WBC 10.0  HGB 10.8*  HCT 31.6*  NA 135  K 4.4  CL 101  CO2 26  BUN 10  CREATININE 0.65   Liver Panel No results for input(s): PROT, ALBUMIN, AST, ALT, ALKPHOS, BILITOT, BILIDIR,  IBILI in the last 72 hours. Sedimentation Rate Recent Labs    05/12/18 1756  ESRSEDRATE 84*   C-Reactive Protein Recent Labs    05/12/18 1756  CRP 12.8*    Microbiology: Recent Results (from the past 240 hour(s))  Aerobic/Anaerobic Culture (surgical/deep wound)     Status: None (Preliminary result)   Collection Time: 05/12/18  2:41 PM  Result Value Ref Range Status   Specimen Description   Final    HIP RIGHT Performed at [facility], [place] [address], [place], [place] [address]    Special Requests   Final    SOFT TISSUE FLUID Performed at [facility], [place] [address], [place], [place] [address]    Gram Stain   Final    FEW WBC PRESENT, PREDOMINANTLY PMN NO ORGANISMS SEEN Gram Stain Report Called to,Read Back By and Verified With: DR OLIN AT 1531 ON 05/12/18 BY N.THOMPSON Performed at [facility], [place] [address] [place], [place] [address]    Culture   Final    NO GROWTH < 24 HOURS Performed at [facility] [address], [place], [place] [address]    Report Status PENDING  Incomplete    Studies/Results: Dg  Pelvis Portable  Result Date: 05/12/2018 CLINICAL DATA:  Right hip revision EXAM: PORTABLE PELVIS 1-2 VIEWS COMPARISON:  05/12/2017 FINDINGS: Status post bilateral hip replacements. Normal alignment and intact hardware. Small amount of soft tissue gas about the right hip consistent with history of revision. Pubic symphysis and rami are intact. IMPRESSION: Status post right hip revision with postoperative air in the soft tissues Electronically Signed   By: Donavan Foil M.D.   On: 05/12/2018 16:43   [place] Ekg Site Rite  Result Date: 05/12/2018 If Site Rite image not attached, placement could not be confirmed due to current cardiac rhythm.    Assessment/Plan: Prosthetic joint infection  Total days of antibiotics: 1 dapto  Await his cx, ngtd CRP 12.8 and ESR 84 Will watch these in f/u (could be up  from surgery?) PIC pending.  Appreciate pharm help with dapto dosing and drug interactions.          [name] MD, FACP Infectious Diseases (pager) [phone] [email or web address] 05/13/2018, 1:26 PM  LOS: 1 day

## 2018-05-13 NOTE — Progress Notes (Signed)
     Subjective: 1 Day Post-Op Procedure(s) (LRB): excisional and nonexcisional debridement right hip, head/liner exchange revison (Right)   Seen by Dr. Alvan Dame.  Patient reports pain as mild, pain controlled.  No reported events throughout the night.  Order for PICC line was placed yesterday.  Infectious disease consulted.  Expect patient to be here for a couple days for evaluation of infection and proper antibiotics.    Objective:   VITALS:   Vitals:   05/13/18 0224 05/13/18 0617  BP: 115/69 123/69  Pulse: (!) 57 (!) 53  Resp: 15 16  Temp: (!) 97.3 F (36.3 C) (!) 97.5 F (36.4 C)  SpO2: 99% 99%    Dorsiflexion/Plantar flexion intact Incision: dressing C/D/I No cellulitis present Compartment soft  LABS Recent Labs    05/13/18 0536  HGB 10.8*  HCT 31.6*  WBC 10.0  PLT 402*    Recent Labs    05/13/18 0536  NA 135  K 4.4  BUN 10  CREATININE 0.65  GLUCOSE 192*     Assessment/Plan: 1 Day Post-Op Procedure(s) (LRB): excisional and nonexcisional debridement right hip, head/liner exchange revison (Right) Foley cath d/c'ed Advance diet Up with therapy D/C IV fluids Discharge home eventually, when ready ID Consulted PICC order placed yesterday     [place] Pugh. Tanicia Wolaver   PAC  05/13/2018, 7:46 AM

## 2018-05-13 NOTE — Plan of Care (Signed)
Patient alert and oriented x4.  Pain assessed.  Plan of care reviewed.  Fluids infusing.  Bed rails up x2.  Call bell within reach.  Will continue to monitor.   Problem: Health Behavior/Discharge Planning: Goal: Ability to manage health-related needs will improve Outcome: Progressing   Problem: Clinical Measurements: Goal: Ability to maintain clinical measurements within normal limits will improve Outcome: Progressing Goal: Will remain free from infection Outcome: Progressing Goal: Diagnostic test results will improve Outcome: Progressing Goal: Respiratory complications will improve Outcome: Progressing Goal: Cardiovascular complication will be avoided Outcome: Progressing   Problem: Activity: Goal: Risk for activity intolerance will decrease Outcome: Progressing   Problem: Nutrition: Goal: Adequate nutrition will be maintained Outcome: Progressing   Problem: Coping: Goal: Level of anxiety will decrease Outcome: Progressing   Problem: Elimination: Goal: Will not experience complications related to bowel motility Outcome: Progressing Goal: Will not experience complications related to urinary retention Outcome: Progressing   Problem: Pain Managment: Goal: General experience of comfort will improve Outcome: Progressing   Problem: Safety: Goal: Ability to remain free from injury will improve Outcome: Progressing   Problem: Skin Integrity: Goal: Risk for impaired skin integrity will decrease Outcome: Progressing   Problem: Education: Goal: Required Educational Video(s) Outcome: Progressing   Problem: Clinical Measurements: Goal: Postoperative complications will be avoided or minimized Outcome: Progressing   Problem: Skin Integrity: Goal: Demonstration of wound healing without infection will improve Outcome: Progressing

## 2018-05-13 NOTE — Progress Notes (Signed)
[facility] Infusion Coordinator is following pt with ID team to support home IV ABX at DC when ordered.  If patient discharges after hours, please call [phone].   [name] 05/13/2018, 2:42 PM

## 2018-05-13 NOTE — Evaluation (Signed)
Physical Therapy Evaluation Patient Details Name: [name] MRN: [id number] DOB: 01/30/1961 Today's Date: 05/13/2018   History of Present Illness  57 YO male admitted on 7/30 for debridement and head/liner revision of R hip, posterior approach. Pt with 6 R hip surgeries including I&Ds. PMH of HTN, cancer, arthritis. Surgical history of multiple back surgeries including fusions and L THA and revision in 04/2017.  Clinical Impression  Pt is a 57 YO male with history of I&D of R THA, had debridement and revision of R hip on 7/30. Pt presents with decreased tolerance for ambulation, increased time to complete bed mobility and transfers, and decreased knowledge of precautions. Pt able to state 2/3 posterior hip precautions, so continue to reinforce. Pt would benefit from acute PT to address these mobility deficits. Recommending return home with wife. Will continue to follow acutely.     Follow Up Recommendations Follow surgeon's recommendation for DC plan and follow-up therapies    Equipment Recommendations  None recommended by PT    Recommendations for Other Services       Precautions / Restrictions Precautions Precautions: Fall;Posterior Hip Restrictions Weight Bearing Restrictions: No Other Position/Activity Restrictions: WBAT       Mobility  Bed Mobility Overal bed mobility: Needs Assistance Bed Mobility: Supine to Sit     Supine to sit: Min guard     General bed mobility comments: increased time  Transfers Overall transfer level: Needs assistance Equipment used: Rolling walker (2 wheeled) Transfers: Sit to/from Stand Sit to Stand: Min guard         General transfer comment: verbal cuing for hand placement   Ambulation/Gait Ambulation/Gait assistance: Min guard Gait Distance (Feet): 120 Feet Assistive device: Rolling walker (2 wheeled) Gait Pattern/deviations: Step-to pattern;Trunk flexed Gait velocity: decreased    General Gait Details: verbal cuing for  stepping into RW   Stairs            Wheelchair Mobility    Modified Rankin (Stroke Patients Only)       Balance Overall balance assessment: Mild deficits observed, not formally tested                                           Pertinent Vitals/Pain Pain Assessment: 0-10 Pain Score: 4  Pain Location: R hip  Pain Descriptors / Indicators: Sore Pain Intervention(s): Limited activity within patient's tolerance;Monitored during session;Premedicated before session    Home Living Family/patient expects to be discharged to:: Private residence Living Arrangements: Spouse/significant other Available Help at Discharge: Family;Available 24 hours/day Type of Home: House Home Access: Stairs to enter   [employer] of Steps: 1 Home Layout: One level Home Equipment: Walker - 2 wheels;Crutches;Bedside commode      Prior Function Level of Independence: Independent with assistive device(s)               Hand Dominance   Dominant Hand: Right    Extremity/Trunk Assessment   Upper Extremity Assessment Upper Extremity Assessment: Overall WFL for tasks assessed    Lower Extremity Assessment Lower Extremity Assessment: RLE deficits/detail RLE Deficits / Details: able to complete ankle pumps, anticipated post-op hip weakness  RLE: Unable to fully assess due to pain       Communication   Communication: No difficulties  Cognition Arousal/Alertness: Awake/alert Behavior During Therapy: WFL for tasks assessed/performed Overall Cognitive Status: Within Functional Limits for tasks assessed  General Comments      Exercises Total Joint Exercises Ankle Circles/Pumps: AROM;15 reps;Seated;Both   Assessment/Plan    PT Assessment Patient needs continued PT services  PT Problem List Pain;Decreased mobility;Decreased safety awareness;Decreased knowledge of precautions;Decreased activity  tolerance;Decreased balance;Decreased strength       PT Treatment Interventions DME instruction;Therapeutic activities;Gait training;Balance training;Functional mobility training;Patient/family education;Therapeutic exercise;Stair training    PT Goals (Current goals can be found in the Care Plan section)  Acute Rehab PT Goals PT Goal Formulation: With patient Time For Goal Achievement: 05/20/18 Potential to Achieve Goals: Good    Frequency 7X/week   Barriers to discharge        Co-evaluation               AM-PAC PT "6 Clicks" Daily Activity  Outcome Measure Difficulty turning over in bed (including adjusting bedclothes, sheets and blankets)?: A Little Difficulty moving from lying on back to sitting on the side of the bed? : Unable Difficulty sitting down on and standing up from a chair with arms (e.g., wheelchair, bedside commode, etc,.)?: Unable Help needed moving to and from a bed to chair (including a wheelchair)?: A Little Help needed walking in hospital room?: A Little Help needed climbing 3-5 steps with a railing? : A Little 6 Click Score: 14    End of Session Equipment Utilized During Treatment: Gait belt Activity Tolerance: Patient tolerated treatment well;No increased pain Patient left: in chair;with chair alarm set;with call bell/phone within reach   PT Visit Diagnosis: Difficulty in walking, not elsewhere classified (R26.2)    Time: 4098-1191 PT Time Calculation (min) (ACUTE ONLY): 18 min   Charges:   PT Evaluation $PT Eval Low Complexity: 1 Low         [name], PT, DPT  Pager # [phone]    [name] 05/13/2018, 1:47 PM

## 2018-05-14 LAB — CBC
HCT: 30.4 % — ABNORMAL LOW (ref 39.0–52.0)
Hemoglobin: 10.5 g/dL — ABNORMAL LOW (ref 13.0–17.0)
MCH: 32.7 pg (ref 26.0–34.0)
MCHC: 34.5 g/dL (ref 30.0–36.0)
MCV: 94.7 fL (ref 78.0–100.0)
PLATELETS: 434 10*3/uL — AB (ref 150–400)
RBC: 3.21 MIL/uL — AB (ref 4.22–5.81)
RDW: 13.2 % (ref 11.5–15.5)
WBC: 11.8 10*3/uL — ABNORMAL HIGH (ref 4.0–10.5)

## 2018-05-14 LAB — BASIC METABOLIC PANEL
Anion gap: 7 (ref 5–15)
BUN: 14 mg/dL (ref 6–20)
CALCIUM: 8.9 mg/dL (ref 8.9–10.3)
CO2: 28 mmol/L (ref 22–32)
CREATININE: 0.72 mg/dL (ref 0.61–1.24)
Chloride: 103 mmol/L (ref 98–111)
GFR calc Af Amer: >60 mL/min (ref >60)
GLUCOSE: 154 mg/dL — AB (ref 70–99)
Potassium: 4.1 mmol/L (ref 3.5–5.1)
SODIUM: 138 mmol/L (ref 135–145)

## 2018-05-14 NOTE — Progress Notes (Signed)
Pt's BP is 103/53, HR is 52 apical pulse, pt is asymptomatic no c/o chest pain and SOB, Paged MD on call, ordered to hold Coreg 12.5mg  PO. Will continue to monitor patient.

## 2018-05-14 NOTE — Progress Notes (Signed)
Patient ID: [name], male   DOB: 1961/03/10, 56 y.o.   MRN: [id number] Subjective: 2 Days Post-Op Procedure(s) (LRB): excisional and nonexcisional debridement right hip, head/liner exchange revison (Right)    Patient reports pain as mild.  Doing well, feeling better overall, no events.  Ready to go home.  PIC line placed yesterday  Objective:   VITALS:   Vitals:   05/14/18 0602 05/14/18 0949  BP: 130/60   Pulse: (!) 56 63  Resp: 16   Temp: 98.2 F (36.8 C)   SpO2: 99%     Neurovascular intact Incision: dressing C/D/I  LABS Recent Labs    05/13/18 0536 05/14/18 0509  HGB 10.8* 10.5*  HCT 31.6* 30.4*  WBC 10.0 11.8*  PLT 402* 434*    Recent Labs    05/13/18 0536 05/14/18 0509  NA 135 138  K 4.4 4.1  BUN 10 14  CREATININE 0.65 0.72  GLUCOSE 192* 154*    No results for input(s): LABPT, INR in the last 72 hours.   Assessment/Plan: 2 Days Post-Op Procedure(s) (LRB): excisional and nonexcisional debridement right hip, head/liner exchange revison (Right)   Up with therapy Discharge home with home health with IV antibiotics per ID and pharmacy for 6 weeks WBAT RLE

## 2018-05-14 NOTE — Progress Notes (Signed)
Physical Therapy Treatment Patient Details Name: [name] MRN: [id number] DOB: [birth year] Today's Date: 05/14/2018    History of Present Illness 57 YO male admitted on 7/30 for debridement and head/liner revision of R hip, posterior approach. Pt with 6 R hip surgeries including I&Ds. PMH of HTN, cancer, arthritis. Surgical history of multiple back surgeries including fusions and L THA and revision in 04/2017.    PT Comments    Pt successfully recalled 3/3 posterior hip precautions and completed home exercises guided by the handout, with no difficulty. Pt requires min guard assist for standing and step climbing, but pt assures PT that pt will have wife assist with stair climbing and mobility as needed. Plan to d/c home today with no follow up PT due to history with R posterior THA posterior rehabilitation. Nursing notified pt is cleared by PT.    Follow Up Recommendations  Follow surgeon's recommendation for DC plan and follow-up therapies     Equipment Recommendations  None recommended by PT    Recommendations for Other Services       Precautions / Restrictions Precautions Precautions: Fall;Posterior Hip Precaution Booklet Issued: Yes (comment) Precaution Comments: Patient knows 3/3 posterior hip precautions  Restrictions Weight Bearing Restrictions: No Other Position/Activity Restrictions: WBAT     Mobility  Bed Mobility Overal bed mobility: Modified Independent Bed Mobility: Supine to Sit     Supine to sit: Modified independent (Device/Increase time)     General bed mobility comments: increased time, maintains posterior hip precautions with bed mobility   Transfers Overall transfer level: Needs assistance Equipment used: Rolling walker (2 wheeled) Transfers: Sit to/from Stand Sit to Stand: Min guard         General transfer comment: min guard for safety, no steadying or verbal cuing for standing required   Ambulation/Gait Ambulation/Gait assistance:  Supervision Gait Distance (Feet): 200 Feet Assistive device: Crutches(single crutch under LUE) Gait Pattern/deviations: Step-to pattern;Trunk flexed;Antalgic Gait velocity: decreased    General Gait Details: no verbal cuing required for sequencing    Stairs Stairs: Yes Stairs assistance: Min assist;Min guard Stair Management: One rail Right Number of Stairs: 1 General stair comments: single step peformed x2. min assist for first time ascending stairs for steadying and sequencing. Min guard for descending and ascending rest of the time.    Wheelchair Mobility    Modified Rankin (Stroke Patients Only)       Balance Overall balance assessment: Mild deficits observed, not formally tested                                          Cognition Arousal/Alertness: Awake/alert Behavior During Therapy: WFL for tasks assessed/performed Overall Cognitive Status: Within Functional Limits for tasks assessed                                        Exercises Total Joint Exercises Ankle Circles/Pumps: AROM;Both;20 reps;Supine Quad Sets: AROM;15 reps;Right;Supine Short Arc Quad: AROM;Right;10 reps;Supine Heel Slides: AROM;Right;10 reps;Supine Hip ABduction/ADduction: AROM;Right;10 reps;Supine;Standing Long Arc Quad: AROM;Right;Seated;10 reps Knee Flexion: Standing;10 reps;AROM;Right Goniometric ROM: reaches 70 degrees AROM hip flexion in supine, 80 degrees when performing marching and sitting EOB Marching in Standing: AROM;Right;Standing;10 reps Standing Hip Extension: AROM;Right;Standing;10 reps    General Comments General comments (skin integrity, edema, etc.): dynamic balance while stair climbing still  improving, pt states wife can physically assist and provide min guard with step climbing upon return home.       Pertinent Vitals/Pain Pain Assessment: 0-10 Pain Score: 2  Pain Location: R hip  Pain Descriptors / Indicators:  Sore;Aching;Constant Pain Intervention(s): Limited activity within patient's tolerance;Repositioned;Ice applied;Monitored during session    Home Living                      Prior Function            PT Goals (current goals can now be found in the care plan section) Acute Rehab PT Goals PT Goal Formulation: With patient Time For Goal Achievement: 05/20/18 Potential to Achieve Goals: Good Progress towards PT goals: Progressing toward goals    Frequency    7X/week      PT Plan      Co-evaluation              AM-PAC PT "6 Clicks" Daily Activity  Outcome Measure  Difficulty turning over in bed (including adjusting bedclothes, sheets and blankets)?: A Little Difficulty moving from lying on back to sitting on the side of the bed? : A Little Difficulty sitting down on and standing up from a chair with arms (e.g., wheelchair, bedside commode, etc,.)?: Unable Help needed moving to and from a bed to chair (including a wheelchair)?: A Little Help needed walking in hospital room?: A Little Help needed climbing 3-5 steps with a railing? : A Little 6 Click Score: 16    End of Session Equipment Utilized During Treatment: Other (comment)(no gait belt utilized due to pt being supervision for most mobility today, plan to d/c today) Activity Tolerance: Patient tolerated treatment well;No increased pain Patient left: in chair;with chair alarm set;with call bell/phone within reach   PT Visit Diagnosis: Difficulty in walking, not elsewhere classified (R26.2)     Time: 9702-6378 PT Time Calculation (min) (ACUTE ONLY): 24 min  Charges:  $Gait Training: 8-22 mins $Therapeutic Exercise: 8-22 mins                     Birdie Beveridge Conception Chancy, PT, DPT  Pager # [phone]     Aida Lemaire D Carolos Fecher 05/14/2018, 12:18 PM

## 2018-05-14 NOTE — Plan of Care (Signed)
Pt stable at this time. No changes needed to care plans at this time.

## 2018-05-15 DIAGNOSIS — Z881 Allergy status to other antibiotic agents status: Secondary | ICD-10-CM

## 2018-05-15 DIAGNOSIS — M00051 Staphylococcal arthritis, right hip: Secondary | ICD-10-CM

## 2018-05-15 DIAGNOSIS — B9561 Methicillin susceptible Staphylococcus aureus infection as the cause of diseases classified elsewhere: Secondary | ICD-10-CM

## 2018-05-15 MED ORDER — ASPIRIN 81 MG PO CHEW: 81.0000 mg | CHEWABLE_TABLET | Freq: Two times a day (BID) | ORAL | 0 refills | Status: AC

## 2018-05-15 MED ORDER — DOCUSATE SODIUM 100 MG PO CAPS: 100.0000 mg | ORAL_CAPSULE | Freq: Two times a day (BID) | ORAL | 0 refills | Status: DC

## 2018-05-15 MED ORDER — DAPTOMYCIN IV (FOR PTA / DISCHARGE USE ONLY): 800.0000 mg | INTRAVENOUS | 0 refills | Status: AC

## 2018-05-15 MED ORDER — FERROUS SULFATE 325 (65 FE) MG PO TABS: 325.0000 mg | ORAL_TABLET | Freq: Three times a day (TID) | ORAL | 3 refills | Status: DC

## 2018-05-15 MED ORDER — POLYETHYLENE GLYCOL 3350 17 G PO PACK: 17.0000 g | PACK | Freq: Two times a day (BID) | ORAL | 0 refills | Status: DC

## 2018-05-15 MED ORDER — METHOCARBAMOL 500 MG PO TABS: 500.0000 mg | ORAL_TABLET | Freq: Four times a day (QID) | ORAL | 0 refills | Status: DC | PRN

## 2018-05-15 MED ORDER — HYDROCODONE-ACETAMINOPHEN 7.5-325 MG PO TABS: 1.0000 | ORAL_TABLET | ORAL | 0 refills | Status: DC | PRN

## 2018-05-15 MED ORDER — HEPARIN SOD (PORK) LOCK FLUSH 100 UNIT/ML IV SOLN
250.0000 [IU] | INTRAVENOUS | Status: AC | PRN
  Administered 2018-05-15: 250 [IU]

## 2018-05-15 NOTE — Progress Notes (Signed)
     Subjective: 3 Days Post-Op Procedure(s) (LRB): excisional and nonexcisional debridement right hip, head/liner exchange revison (Right)   Patient reports pain as mild, pain controlled.  No events throughout the night. Discussed awaiting final recs from ID and confirmation that home health is set up for the home antibiotics.     Objective:   VITALS:   Vitals:   05/14/18 2153 05/15/18 0620  BP: (!) 103/53 (!) 144/64  Pulse: (!) 52 (!) 52  Resp: 16 16  Temp: 97.7 F (36.5 C) 97.8 F (36.6 C)  SpO2: 98% 100%    Dorsiflexion/Plantar flexion intact Incision: dressing C/D/I No cellulitis present Compartment soft  LABS Recent Labs    05/13/18 0536 05/14/18 0509  HGB 10.8* 10.5*  HCT 31.6* 30.4*  WBC 10.0 11.8*  PLT 402* 434*    Recent Labs    05/13/18 0536 05/14/18 0509  NA 135 138  K 4.4 4.1  BUN 10 14  CREATININE 0.65 0.72  GLUCOSE 192* 154*     Assessment/Plan: 3 Days Post-Op Procedure(s) (LRB): excisional and nonexcisional debridement right hip, head/liner exchange revison (Right) Up with therapy Discharge home with home health , when appropriately setup. Follow up in 2 weeks at [facility] Follow up with OLIN,Tova Vater D in 2 weeks.  Contact information:  [facility]) [address] [place]. Denni France   PAC  05/15/2018, 7:32 AM

## 2018-05-15 NOTE — Progress Notes (Addendum)
    [name] for Infectious Disease    Date of Admission:  05/12/2018   Total days of antibiotics 4        Day 3 daptomcyin   ID: [name] is a 57 y.o. 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 amLODipine  2.5 mg Oral Daily  . aspirin  81 mg Oral BID  . carvedilol  12.5 mg Oral BID  . docusate sodium  100 mg Oral BID  . ferrous sulfate  325 mg Oral TID PC  . polyethylene glycol  17 g Oral BID  . traMADol  50 mg Oral Q6H    Objective: Vital signs in last 24 hours: Temp:  [97.7 F (36.5 C)-97.8 F (36.6 C)] 97.8 F (36.6 C) (08/02 0620) Pulse Rate:  [51-55] 55 (08/02 0919) Resp:  [16] 16 (08/02 0620) BP: (103-151)/(53-79) 151/79 (08/02 0919) SpO2:  [98 %-100 %] 100 % (08/02 3267) Physical Exam  Constitutional: He is oriented to person, place, and time. He appears well-developed and well-nourished. No distress.  HENT:  Mouth/Throat: Oropharynx is clear and moist. No oropharyngeal exudate.  Cardiovascular: Normal rate, regular rhythm and normal heart sounds. Exam reveals no gallop and no friction rub.  No murmur heard.  Pulmonary/Chest: Effort normal and breath sounds normal. No respiratory distress. He has no wheezes.  Abdominal: Soft. Bowel sounds are slow. He exhibits no distension. There is no tenderness.  Hip = right bandaged, no surrounding erythema Neurological: He is alert and oriented to person, place, and time.  Skin: Skin is warm and dry. No rash noted. No erythema.  Psychiatric: He has a normal mood and affect. His behavior is normal.      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 Prelim cultures showing MSSA- but patient has allergy to cephalosporin  - recommend to treat with daptomycin at 8mg /kg qday x 6-8 wk then transition to oral abtx would try doxy or bactrim to see if he has repeat rash. He had rash to doxy roughly 7 yrs ago.  - no need for rifampin since MSSA strain is Resistant - end date of sep 11th, 2019 - will need weekly CK, bmp, cbc with diff, sed rate and crp - will see back in the ID clinic in 4 wk.  Have communicated plan to Home health coordinator  Constipation= recommend to use miralax at home.titrate to effect.  Spent 25 min with patient with greater than 50% in coordination of care for pji  [facility] for Infectious Diseases Cell: [phone] Pager: [phone]  05/15/2018, 11:39 AM

## 2018-05-15 NOTE — Plan of Care (Signed)
Patient is awaiting authorization from insurance company for at home antibiotic, in order to be discharged

## 2018-05-15 NOTE — Progress Notes (Signed)
PHARMACY CONSULT NOTE FOR:  OUTPATIENT  PARENTERAL ANTIBIOTIC THERAPY (OPAT)  Indication: Prosthetic joint infection Regimen: Daptomycin 8 mg/kg (800 mg) IV q24h End date: 06/24/18  IV antibiotic discharge orders are pended. To discharging provider:  please sign these orders via discharge navigator,  Select New Orders & click on the button choice - Manage This Unsigned Work.     Thank you for allowing pharmacy to be a part of this patient's care.  Lenis Noon, PharmD 05/15/2018, 12:17 PM

## 2018-05-15 NOTE — Progress Notes (Signed)
PT Cancellation Note  Patient Details Name: [name] MRN: [id number] DOB: 1961-05-15   Cancelled Treatment:     Reason Eval/Treat Not Completed: Pt states no further PT needs, as pt knows his exercises, is supervision for ambulation with single L axillary crutch, and can get assist with step at home from wife, as he is min guard. Pt to d/c this morning.   Julien Girt, PT, DPT  Pager # [phone]    Jezabelle Chisolm D Tynisa Vohs 05/15/2018, 8:55 AM

## 2018-05-15 NOTE — Progress Notes (Signed)
Physical Therapy Treatment Patient Details Name: [name] MRN: [id number] DOB: Jun 20, 1961 Today's Date: 05/15/2018    History of Present Illness 57 YO male admitted on 7/30 for debridement and head/liner revision of R hip, posterior approach. Pt with 6 R hip surgeries including I&Ds. PMH of HTN, cancer, arthritis. Surgical history of multiple back surgeries including fusions and L THA and revision in 04/2017.    PT Comments    Per nursing, pt wanted to mobilize because he has not d/ced yet. Pt with little to no cuing needed during mobility today. Pt with increased gait speed, decreased hip pain, and improved mobility skills. Pt's plan is to d/c home today or tomorrow morning.    Follow Up Recommendations  Follow surgeon's recommendation for DC plan and follow-up therapies     Equipment Recommendations  None recommended by PT    Recommendations for Other Services       Precautions / Restrictions Precautions Precautions: Fall;Posterior Hip Precaution Booklet Issued: Yes (comment) Precaution Comments: Patient knows 3/3 posterior hip precautions  Restrictions Weight Bearing Restrictions: No Other Position/Activity Restrictions: WBAT     Mobility  Bed Mobility Overal bed mobility: Modified Independent Bed Mobility: Supine to Sit;Sit to Supine     Supine to sit: Modified independent (Device/Increase time) Sit to supine: Modified independent (Device/Increase time)   General bed mobility comments: increased time, maintains posterior hip precautions with bed mobility, no verbal cuing required during session for sequencing   Transfers Overall transfer level: Needs assistance Equipment used: Rolling walker (2 wheeled) Transfers: Sit to/from Stand Sit to Stand: Supervision         General transfer comment: no steadying or verbal cuing for standing required, pt maintained precautions while performing sit to stand and stand to sit   Ambulation/Gait Ambulation/Gait  assistance: Supervision Gait Distance (Feet): 160 Feet Assistive device: Crutches(single crutch under LUE) Gait Pattern/deviations: Step-to pattern;Antalgic Gait velocity: increased from yesterday    General Gait Details: pt with increased gait speed, less trunk flexion. no verbal cuing for sequencing required    Stairs             Wheelchair Mobility    Modified Rankin (Stroke Patients Only)       Balance Overall balance assessment: Mild deficits observed, not formally tested                                          Cognition Arousal/Alertness: Awake/alert Behavior During Therapy: WFL for tasks assessed/performed Overall Cognitive Status: Within Functional Limits for tasks assessed                                        Exercises      General Comments        Pertinent Vitals/Pain Pain Assessment: 0-10 Pain Score: 2  Pain Location: R hip  Pain Descriptors / Indicators: Sore;Aching Pain Intervention(s): Limited activity within patient's tolerance;Monitored during session;Repositioned    Home Living                      Prior Function            PT Goals (current goals can now be found in the care plan section) Acute Rehab PT Goals PT Goal Formulation: With patient Time For Goal Achievement: 05/20/18 Potential  to Achieve Goals: Good Progress towards PT goals: Progressing toward goals    Frequency    7X/week      PT Plan Current plan remains appropriate    Co-evaluation              AM-PAC PT "6 Clicks" Daily Activity  Outcome Measure  Difficulty turning over in bed (including adjusting bedclothes, sheets and blankets)?: A Little Difficulty moving from lying on back to sitting on the side of the bed? : A Little Difficulty sitting down on and standing up from a chair with arms (e.g., wheelchair, bedside commode, etc,.)?: Unable Help needed moving to and from a bed to chair (including a  wheelchair)?: A Little Help needed walking in hospital room?: A Little Help needed climbing 3-5 steps with a railing? : A Little 6 Click Score: 16    End of Session Equipment Utilized During Treatment: Other (comment) Activity Tolerance: Patient tolerated treatment well;No increased pain Patient left: with call bell/phone within reach;in bed;with bed alarm set Nurse Communication: Mobility status PT Visit Diagnosis: Other abnormalities of gait and mobility (R26.89)     Time: 1440-1453 PT Time Calculation (min) (ACUTE ONLY): 13 min  Charges:  $Gait Training: 8-22 mins                     Arna Luis Conception Chancy, PT, DPT  Pager # [phone]     [facility] 05/15/2018, 3:26 PM

## 2018-05-17 LAB — AEROBIC/ANAEROBIC CULTURE W GRAM STAIN (SURGICAL/DEEP WOUND)

## 2018-05-17 LAB — AEROBIC/ANAEROBIC CULTURE (SURGICAL/DEEP WOUND)

## 2018-05-19 NOTE — Discharge Summary (Signed)
Physician Discharge Summary  Patient ID: [name] MRN: [id number] DOB/AGE: [birth year] 57 y.o.  Admit date: 05/12/2018 Discharge date: 05/15/2018   Procedures:  Procedure(s) (LRB): excisional and nonexcisional debridement right hip, head/liner exchange revison (Right)  Attending Physician:  Dr. Paralee Cancel   Admission Diagnoses:   Acute right total hip infection   Discharge Diagnoses:  Principal Problem:   Right prosthetic hip infection  Past Medical History:  Diagnosis Date  . Arthritis   . Bruit of right carotid artery    diagnosed August 2018  . Cancer ([place])    basal cell CA removed from both legs  . History of blood transfusion   . Hypertension   . PONV (postoperative nausea and vomiting)   . Sleep apnea    hx of had surgery for sleep apnea    HPI:    This 57 year-old male patient is 14 months status post RIGHT TOTAL HIP ARTHROPLASTY REVISION performed on 02/13/2017, 16 months status post RIGHT TOTAL HIP ARTHROPLASTY DEBRIDEMENT AND POLY EXCHANGE performed on 01/09/2017 and 23 months status post RIGHT TOTAL HIP ARTHROPLASTY REVISION performed on 05/29/2016.  The symptoms began on 05/03/2018 in the absence of an acute injury. The patient reports acute right hip pain. Associated symptoms include fever, swelling and redness. His treatments thus far include clindamycin and bactrim.  Additionally, the patient had dental work on 04/02/2018 after taking his prescribed clindamycin. He mentions itching after taking bactrim and clindamycin.  Due to the relative acuity of his presentation I am going to try to treat this as an acute infection. I will not set him up for aspiration based on his presentation and fever. Labs will most likely confirm suspicion for infection. If they do not then aspiration could be ordered. Otherwise I will plan on getting him on the OR schedule next week for I&D head and liner exchange with IV antibiotics for 6 weeks postop followed by 6 months of oral  antibiotics as we have done in the past. Goal here is joint preservation.  Risks, benefits and expectations were discussed with the patient.  Risks including but not limited to the risk of anesthesia, blood clots, nerve damage, blood vessel damage, failure of the prosthesis, infection and up to and including death.  Patient understand the risks, benefits and expectations and wishes to proceed with surgery.    PCP: Yevette Edwards, NP   Discharged Condition: good  Hospital Course:  Patient underwent the above stated procedure on 05/12/2018. Patient tolerated the procedure well and brought to the recovery room in good condition and subsequently to the floor.  POD #1 BP: 123/69 ; Pulse: 53 ; Temp: 97.5 F (36.4 C) ; Resp: 16 Patient reports pain as mild, pain controlled.  No reported events throughout the night.  Order for PICC line was placed yesterday.  Infectious disease consulted.  Expect patient to be here for a couple days for evaluation of infection and proper antibiotics. Dorsiflexion/plantar flexion intact, incision: dressing C/D/I, no cellulitis present and compartment soft.   LABS  Basename    HGB     10.8  HCT     31.6   POD #2  BP: 130/60 ; Pulse: 63 ; Temp: 98.2 F (36.8 C) ; Resp: 16 Patient reports pain as mild.  Doing well, feeling better overall, no events.  PIC line placed yesterday.  Awaiting WC to have home health setup for home antibiotics. Neurovascular intact and incision: dressing C/D/I.   LABS  Basename    HGB  10.5  HCT     30.4   POD #3  BP: 144/64 ; Pulse: 52 ; Temp: 97.8 F (36.6 C) ; Resp: 16 Patient reports pain as mild, pain controlled.  No events throughout the night. Discussed awaiting final recs from ID and confirmation that home health is set up for the home antibiotics.  Ready to be discharged home. Dorsiflexion/plantar flexion intact, incision: dressing C/D/I, no cellulitis present and compartment soft.   LABS   No new labs   Discharge  Exam: General appearance: alert, cooperative and no distress Extremities: Homans sign is negative, no sign of DVT, no edema, redness or tenderness in the calves or thighs and no ulcers, gangrene or trophic changes  Disposition:  Home with follow up in 2 weeks   Follow-up Information    Paralee Cancel, MD. Schedule an appointment as soon as possible for a visit in 2 weeks.   Specialty:  Orthopedic Surgery Contact information: [address] [place] [address] [phone]           Discharge Instructions    Call MD / Call 911   Complete by:  As directed    If you experience chest pain or shortness of breath, CALL 911 and be transported to the hospital emergency room.  If you develope a fever above 101 F, pus (white drainage) or increased drainage or redness at the wound, or calf pain, call your surgeon's office.   Change dressing   Complete by:  As directed    Maintain surgical dressing until follow up in the clinic. If the edges start to pull up, may reinforce with tape. If the dressing is no longer working, may remove and cover with gauze and tape, but must keep the area dry and clean.  Call with any questions or concerns.   Constipation Prevention   Complete by:  As directed    Drink plenty of fluids.  Prune juice may be helpful.  You may use a stool softener, such as Colace (over the counter) 100 mg twice a day.  Use MiraLax (over the counter) for constipation as needed.   Diet - low sodium heart healthy   Complete by:  As directed    Discharge instructions   Complete by:  As directed    Maintain surgical dressing until follow up in the clinic. If the edges start to pull up, may reinforce with tape. If the dressing is no longer working, may remove and cover with gauze and tape, but must keep the area dry and clean.  Follow [facility] in 2 weeks at [facility]. Call with any questions or concerns.   Home infusion instructions [facility] May follow [facility] Dosing Protocol; May administer Cathflo as needed to maintain patency of vascular access device.; Flushing of vascular access device: per [facility] Protocol: 0.9% NaCl pre/post medica...   Complete by:  As directed    Instructions:  May follow [place] Dosing Protocol   Instructions:  May administer Cathflo as needed to maintain patency of vascular access device.   Instructions:  Flushing of vascular access device: per [facility] Protocol: 0.9% NaCl pre/post medication administration and prn patency; Heparin 100 u/ml, 10m for implanted ports and Heparin 10u/ml, 519mfor all other central venous catheters.   Instructions:  May follow [facility] Anaphylaxis Protocol for First Dose Administration in the home: 0.9% NaCl at 25-50 ml/hr to maintain IV access for protocol meds. Epinephrine 0.3 ml IV/IM PRN and Benadryl 25-50 IV/IM PRN  s/s of anaphylaxis.   Instructions:  [facility] Infusion Coordinator (RN) to assist per patient IV care needs in the home PRN.   Increase activity slowly as tolerated   Complete by:  As directed    Weight bearing as tolerated with assist device (walker, cane, etc) as directed, use it as long as suggested by your surgeon or therapist, typically at least 4-6 weeks.   TED hose   Complete by:  As directed    Use stockings (TED hose) for 2 weeks on both leg(s).  You may remove them at night for sleeping.      Allergies as of 05/15/2018      Reactions   Cephalexin Rash   Ace Inhibitors Rash   Bactrim [sulfamethoxazole-trimethoprim] Itching, Rash   Doxycycline Rash      Medication List    STOP taking these medications   amoxicillin 500 MG capsule Commonly known as:  AMOXIL   aspirin EC 81 MG tablet Replaced by:  aspirin 81 MG chewable tablet   HYDROcodone-acetaminophen 5-325 MG tablet Commonly known as:  NORCO/VICODIN Replaced by:  HYDROcodone-acetaminophen 7.5-325 MG tablet     TAKE these medications   amLODipine 2.5 MG tablet Commonly known as:  NORVASC Take  2.5 mg by mouth daily.   aspirin 81 MG chewable tablet Commonly known as:  ASPIRIN CHILDRENS Chew 1 tablet (81 mg total) by mouth 2 (two) times daily. Take for 4 weeks, then resume regular dose. Replaces:  aspirin EC 81 MG tablet   atorvastatin 20 MG tablet Commonly known as:  LIPITOR Take 20 mg daily by mouth.   carvedilol 12.5 MG tablet Commonly known as:  COREG Take 12.5 mg by mouth 2 (two) times daily.   daptomycin IVPB Commonly known as:  CUBICIN Inject 800 mg into the vein daily. Indication:  Prosthetic joint infection Last Day of Therapy:  06/24/18 Labs - Once weekly:  CBC/D, BMP, and CPK Labs - Every other week:  ESR and CRP   docusate sodium 100 MG capsule Commonly known as:  COLACE Take 1 capsule (100 mg total) by mouth 2 (two) times daily.   ferrous sulfate 325 (65 FE) MG tablet Commonly known as:  FERROUSUL Take 1 tablet (325 mg total) by mouth 3 (three) times daily with meals.   HYDROcodone-acetaminophen 7.5-325 MG tablet Commonly known as:  NORCO Take 1-2 tablets by mouth every 4 (four) hours as needed for moderate pain. Replaces:  HYDROcodone-acetaminophen 5-325 MG tablet   methocarbamol 500 MG tablet Commonly known as:  ROBAXIN Take 1 tablet (500 mg total) by mouth every 6 (six) hours as needed for muscle spasms.   multivitamin with minerals Tabs tablet Take 1 tablet by mouth daily.   polyethylene glycol packet Commonly known as:  MIRALAX / GLYCOLAX Take 17 g by mouth 2 (two) times daily.   Vitamin D (Ergocalciferol) 50000 units Caps capsule Commonly known as:  DRISDOL Take 50,000 Units by mouth every Saturday.            Home Infusion Instuctions  (From admission, onward)        Start     Ordered   05/15/18 0000  Home infusion instructions [facility] May follow [place] Dosing Protocol; May administer Cathflo as needed to maintain patency of vascular access device.; Flushing of vascular access device: per [facility] Protocol: 0.9% NaCl  pre/post medica...    Question Answer Comment  Instructions May follow [facility] Dosing Protocol   Instructions May administer Cathflo  as needed to maintain patency of vascular access device.   Instructions Flushing of vascular access device: per [facility] Protocol: 0.9% NaCl pre/post medication administration and prn patency; Heparin 100 u/ml, 11m for implanted ports and Heparin 10u/ml, 553mfor all other central venous catheters.   Instructions May follow [facility] Anaphylaxis Protocol for First Dose Administration in the home: 0.9% NaCl at 25-50 ml/hr to maintain IV access for protocol meds. Epinephrine 0.3 ml IV/IM PRN and Benadryl 25-50 IV/IM PRN s/s of anaphylaxis.   Instructions [facility] Infusion Coordinator (RN) to assist per patient IV care needs in the home PRN.      05/15/18 1230       Discharge Care Instructions  (From admission, onward)        Start     Ordered   05/15/18 0000  Change dressing    Comments:  Maintain surgical dressing until follow up in the clinic. If the edges start to pull up, may reinforce with tape. If the dressing is no longer working, may remove and cover with gauze and tape, but must keep the area dry and clean.  Call with any questions or concerns.   05/15/18 0744       Signed: MaWest PughBabish   PA-C  05/19/2018, 9:42 AM

## 2018-05-22 ENCOUNTER — Other Ambulatory Visit: Payer: Self-pay | Admitting: Pharmacist

## 2018-05-22 NOTE — Progress Notes (Signed)
[facility] pharmacy lab review  

## 2018-06-10 ENCOUNTER — Telehealth: Payer: Self-pay | Admitting: *Deleted

## 2018-06-10 ENCOUNTER — Encounter: Payer: Self-pay | Admitting: Internal Medicine

## 2018-06-10 ENCOUNTER — Ambulatory Visit (INDEPENDENT_AMBULATORY_CARE_PROVIDER_SITE_OTHER): Payer: Worker's Compensation | Admitting: Internal Medicine

## 2018-06-10 VITALS — BP 150/68 | HR 59 | Temp 97.8°F | Wt 215.4 lb

## 2018-06-10 DIAGNOSIS — Z881 Allergy status to other antibiotic agents status: Secondary | ICD-10-CM | POA: Diagnosis not present

## 2018-06-10 DIAGNOSIS — T8450XS Infection and inflammatory reaction due to unspecified internal joint prosthesis, sequela: Secondary | ICD-10-CM

## 2018-06-10 DIAGNOSIS — Z5181 Encounter for therapeutic drug level monitoring: Secondary | ICD-10-CM | POA: Diagnosis not present

## 2018-06-10 NOTE — Telephone Encounter (Signed)
Carolynn Sayers with [facility] called to advise they need a written order to extend the patient IV antibiotics thur 07/08/18 as this is a workers comp case and they require it be in [place]. Advised will let the provider know and get them a written order as soon as possible.

## 2018-06-10 NOTE — Progress Notes (Signed)
RFV: pji Patient ID: [name], male   DOB: 05/22/1961, 57 y.o.   MRN: [id number]  HPI  [name] is a 57yo M with history complicated ortho hx. He  Had right  THA revision in may 2018 but found to have  MSSA pji in July 2018 treated with abtx and prolonged abtx. Had been off of abtx x 6 months before having new onset of pain and fever to right hip that started in late July 2019. Due to high suspicion for PJI he was taken to the OR on July 30th for I x D, liner and ceramic head exchange. Cx grew rare MSSA again. This was thought to be acute hematogenous infection, thought no blood cx were taken at the time. He was discharged on 6 wk of daptomycin (Due to various drug allergies - cephalosporins, bactrim, and doxy). He is tolerating it well. Roughly at at the 4 wk mark.  Hx of : 14 months status post RIGHT TOTAL HIP ARTHROPLASTY REVISION performed on 02/13/2017, 16 months status post RIGHT TOTAL HIP ARTHROPLASTY DEBRIDEMENT AND POLY EXCHANGE performed on 01/09/2017 and 23 months status post RIGHT TOTAL HIP ARTHROPLASTY REVISION performed on 05/29/2016.  Outpatient Encounter Medications as of 06/10/2018  Medication Sig  . amLODipine (NORVASC) 2.5 MG tablet Take 2.5 mg by mouth daily.  Marland Kitchen atorvastatin (LIPITOR) 20 MG tablet Take 20 mg daily by mouth.  . carvedilol (COREG) 12.5 MG tablet Take 12.5 mg by mouth 2 (two) times daily.  . daptomycin (CUBICIN) IVPB Inject 800 mg into the vein daily. Indication:  Prosthetic joint infection Last Day of Therapy:  06/24/18 Labs - Once weekly:  CBC/D, BMP, and CPK Labs - Every other week:  ESR and CRP  . docusate sodium (COLACE) 100 MG capsule Take 1 capsule (100 mg total) by mouth 2 (two) times daily.  Marland Kitchen HYDROcodone-acetaminophen (NORCO) 7.5-325 MG tablet Take 1-2 tablets by mouth every 4 (four) hours as needed for moderate pain.  . Multiple Vitamin (MULTIVITAMIN WITH MINERALS) TABS tablet Take 1 tablet by mouth daily.  . polyethylene glycol (MIRALAX /  GLYCOLAX) packet Take 17 g by mouth 2 (two) times daily.  . Vitamin D, Ergocalciferol, (DRISDOL) 50000 units CAPS capsule Take 50,000 Units by mouth every Saturday.  Marland Kitchen aspirin (ASPIRIN CHILDRENS) 81 MG chewable tablet Chew 1 tablet (81 mg total) by mouth 2 (two) times daily. Take for 4 weeks, then resume regular dose. (Patient not taking: Reported on 06/10/2018)  . ferrous sulfate (FERROUSUL) 325 (65 FE) MG tablet Take 1 tablet (325 mg total) by mouth 3 (three) times daily with meals. (Patient not taking: Reported on 06/10/2018)  . methocarbamol (ROBAXIN) 500 MG tablet Take 1 tablet (500 mg total) by mouth every 6 (six) hours as needed for muscle spasms. (Patient not taking: Reported on 06/10/2018)   No facility-administered encounter medications on file as of 06/10/2018.      Patient Active Problem List   Diagnosis Date Noted  . 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 Acute hyponatremia 12/31/2016  . Acute renal disease 12/31/2016  . Prosthetic joint infection ([place]) 12/31/2016  . Obese 07/19/2015  . Essential hypertension 11/06/2010     Health Maintenance Due  Topic Date Due  . Hepatitis C Screening  1961/08/25  . TETANUS/TDAP  01/21/1978  . COLONOSCOPY  01/21/2009  . INFLUENZA VACCINE  05/14/2018    Social History   Tobacco Use  . Smoking status: Former Smoker  Types: Cigarettes    Last attempt to quit: 10/14/2004    Years since quitting: 13.6  . Smokeless tobacco: Never Used  Substance Use Topics  . Alcohol use: No    Alcohol/week: 2.0 standard drinks    Types: 2 Cans of beer per week  . Drug use: No   Review of Systems  Constitutional: Negative for fever, chills, diaphoresis, activity change, appetite change, fatigue and unexpected weight change.  HENT: Negative for congestion, sore throat, rhinorrhea, sneezing, trouble swallowing and sinus pressure.  Eyes: Negative for photophobia and visual disturbance.  Respiratory: Negative  for cough, chest tightness, shortness of breath, wheezing and stridor.  Cardiovascular: Negative for chest pain, palpitations and leg swelling.  Gastrointestinal: Negative for nausea, vomiting, abdominal pain, diarrhea, constipation, blood in stool, abdominal distention and anal bleeding.  Genitourinary: Negative for dysuria, hematuria, flank pain and difficulty urinating.  Musculoskeletal: Negative for myalgias, back pain, joint swelling, arthralgias and gait problem.  Skin: Negative for color change, pallor, rash and wound.  Neurological: Negative for dizziness, tremors, weakness and light-headedness.  Hematological: Negative for adenopathy. Does not bruise/bleed easily.  Psychiatric/Behavioral: Negative for behavioral problems, confusion, sleep disturbance, dysphoric mood, decreased concentration and agitation.    Physical Exam   BP (!) 150/68   Pulse (!) 59   Temp 97.8 F (36.6 C)   Wt 215 lb 6.4 oz (97.7 kg)   BMI 33.24 kg/m   Physical Exam  Constitutional: He is oriented to person, place, and time. He appears well-developed and well-nourished. No distress.  HENT:  Mouth/Throat: Oropharynx is clear and moist. No oropharyngeal exudate.  Cardiovascular: Normal rate, regular rhythm and normal heart sounds. Exam reveals no gallop and no friction rub.  No murmur heard.  Pulmonary/Chest: Effort normal and breath sounds normal. No respiratory distress. He has no wheezes.  Ext: right hip well healed extended incision Neurological: He is alert and oriented to person, place, and time.  Skin: Skin is warm and dry. No rash noted. No erythema.  Psychiatric: He has a normal mood and affect. His behavior is normal.    CBC Lab Results  Component Value Date   WBC 11.8 (H) 05/14/2018   RBC 3.21 (L) 05/14/2018   HGB 10.5 (L) 05/14/2018   HCT 30.4 (L) 05/14/2018   PLT 434 (H) 05/14/2018   MCV 94.7 05/14/2018   MCH 32.7 05/14/2018   MCHC 34.5 05/14/2018   RDW 13.2 05/14/2018   LYMPHSABS  1,779 08/25/2017   MONOABS 0.6 05/19/2010   EOSABS 98 08/25/2017    BMET Lab Results  Component Value Date   NA 138 05/14/2018   K 4.1 05/14/2018   CL 103 05/14/2018   CO2 28 05/14/2018   GLUCOSE 154 (H) 05/14/2018   BUN 14 05/14/2018   CREATININE 0.72 05/14/2018   CALCIUM 8.9 05/14/2018   GFRNONAA >60 05/14/2018   GFRAA >60 05/14/2018      Assessment and Plan 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 - 8wk of treatment Chronic suppression will be tricky limited options given his drug allergies. Considering challenging with doxy vs. Doing tedizolid. Will need prolonged course. Maybe worth seeing if he would tolerate dicloxacillin ( has not reported abtx allergies to penicillins)  Therapeutic drug monitoring = I have reviewed his labs for the week. Sed rate 50. CK WNL thus not having SE from daptomycin

## 2018-06-22 NOTE — Telephone Encounter (Signed)
Patient called, stating [facility] still needed written order faxed.  RN relayed verbal order to [facility] at [facility]. She will send extension order for Dr Baxter Flattery to sign. Must be faxed back 9/10.  Patient has enough daptomycin through Wednesday 9/11.Z Landis Gandy, RN

## 2018-06-24 NOTE — Telephone Encounter (Signed)
Signature obtained, faxed to [facility] pharmacy.  Per Stanton Kidney, 2 week extension was approved by [employer]. Landis Gandy, RN

## 2018-06-29 ENCOUNTER — Other Ambulatory Visit: Payer: Self-pay | Admitting: Behavioral Health

## 2018-06-29 ENCOUNTER — Telehealth: Payer: Self-pay | Admitting: Behavioral Health

## 2018-06-29 DIAGNOSIS — Z881 Allergy status to other antibiotic agents status: Secondary | ICD-10-CM

## 2018-06-29 DIAGNOSIS — Z5181 Encounter for therapeutic drug level monitoring: Secondary | ICD-10-CM

## 2018-06-29 DIAGNOSIS — T8450XS Infection and inflammatory reaction due to unspecified internal joint prosthesis, sequela: Secondary | ICD-10-CM

## 2018-06-29 MED ORDER — LINEZOLID 600 MG PO TABS: 600.0000 mg | ORAL_TABLET | Freq: Two times a day (BID) | ORAL | 0 refills | Status: DC

## 2018-06-29 NOTE — Telephone Encounter (Signed)
Nurse Sonia Baller from [facility] called stating while she was doing patient's PICC dressing change today it came out about 1.5 to 2 cm.    Sonia Baller states now the PICC line will not flush and she is unable to draw labs.  She was going to page Dr. Baxter Flattery to make her aware. Pricilla Riffle RN

## 2018-07-08 ENCOUNTER — Ambulatory Visit (INDEPENDENT_AMBULATORY_CARE_PROVIDER_SITE_OTHER): Payer: Worker's Compensation | Admitting: Internal Medicine

## 2018-07-08 ENCOUNTER — Encounter: Payer: Self-pay | Admitting: Internal Medicine

## 2018-07-08 VITALS — BP 163/80 | HR 57 | Temp 97.9°F | Wt 223.8 lb

## 2018-07-08 DIAGNOSIS — Z5181 Encounter for therapeutic drug level monitoring: Secondary | ICD-10-CM | POA: Diagnosis not present

## 2018-07-08 DIAGNOSIS — T8450XS Infection and inflammatory reaction due to unspecified internal joint prosthesis, sequela: Secondary | ICD-10-CM

## 2018-07-08 DIAGNOSIS — Z881 Allergy status to other antibiotic agents status: Secondary | ICD-10-CM | POA: Diagnosis not present

## 2018-07-08 NOTE — Progress Notes (Signed)
RFV: follow up for mssa of right hip pji  Patient ID: [name], male   DOB: 1961-03-03, 57 y.o.   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 It was complicated by hematoma and seromatous fluid within the layers of his wound requiring repeat I x D roughly on POD #3. He was discharged on daptomycin plus rifampin for 6 wk to end on 5/2. He was readmitted on 4/30 for worsening serious drainage to midpoint of his wound concerning for underlying infection. On 4/30, he had ix d with removal of nonviable tissue both superficial and deep.  Decision was made to retreat for another course of IV abtx with dapto plus rif til 7/3.He was converted to bactrim plus rifampin for plan of 6 months but he started to have progressive on going leg hip pain and radiographic evidence of HW loosening. thus, admitted for leftrevision total hip arthroplasty, posterior approach on 7/26.deep cultures were sent that did not show any organisms but gram stain showed numerous WBC. OR report suggested inflammation in the joint but unclear if it had purulence.he was restarted on daptomycin and rifampin on 05/13/17 and converted to bactrim plus rifampin through Jan 2019- with inflammatory markers normalized since Nov.  Roughly 6 months after stopping his abtx, he started to notice on July 20th, 2019 having worsening of right hip pain, swelling and subjective fevers. He was admitted on May 09, 2018 for  RIGHT TOTAL HIP ARTHROPLASTY DEBRIDEMENT AND POLY EXCHANGE and ceramic head replacement on 7/30, OR CX showing MSSA (pan sensitive except R PCN and R RIF). He was discharged on  daptomycin monotherapy and tolerated well until his picc line dislodged on 9/16, where he finished 6 wk of IV daptomycin, then transitioned to oral linezolid for which he has tolerated.  He reports that his hip has minimal pain. He is ambulating at work without difficulty. No fever, chills, rash, diarrhea, thrush, peripheral neuropathy or blurry vision  Allergies =  Cephalexin - rash - in 2001 Doxycycline -  Rash - in 2013 Bactrim - rash in 2018-2019  Outpatient Encounter Medications as of 07/08/2018  Medication Sig  . amLODipine (NORVASC) 2.5 MG tablet Take 2.5 mg by mouth daily.  Marland Kitchen atorvastatin (LIPITOR) 20 MG tablet Take 20 mg daily by mouth.  . carvedilol (COREG) 12.5 MG tablet Take 12.5 mg by mouth 2 (two) times daily.  Marland Kitchen docusate sodium (COLACE) 100 MG capsule Take 1 capsule (100 mg total) by mouth 2 (two) times daily.  . ferrous sulfate (FERROUSUL) 325 (65 FE) MG tablet Take 1 tablet (325 mg total) by mouth 3 (three) times daily with meals.  Marland Kitchen linezolid (ZYVOX) 600 MG tablet Take 1 tablet (600 mg total) by mouth 2 (two) times daily.  . Multiple Vitamin (MULTIVITAMIN WITH MINERALS) TABS tablet Take 1 tablet by mouth daily.  . polyethylene glycol (MIRALAX / GLYCOLAX) packet Take 17 g by mouth 2 (two) times daily.  . Vitamin D, Ergocalciferol, (DRISDOL) 50000 units CAPS capsule Take 50,000 Units by mouth every Saturday.  . [DISCONTINUED] HYDROcodone-acetaminophen (NORCO) 7.5-325 MG tablet Take 1-2 tablets by mouth every 4 (four) hours as  needed for moderate pain.  . methocarbamol (ROBAXIN) 500 MG tablet Take 1 tablet (500 mg total) by mouth every 6 (six) hours as needed for muscle spasms. (Patient not taking: Reported on 07/08/2018)   No facility-administered encounter medications on file as of 07/08/2018.      Patient Active Problem List   Diagnosis Date Noted  . 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 Acute hyponatremia 12/31/2016  . Acute  renal disease 12/31/2016  . Prosthetic joint infection ([place]) 12/31/2016  . Obese 07/19/2015  . Essential hypertension 11/06/2010     Health Maintenance Due  Topic Date Due  . Hepatitis C Screening  07/23/1961  . TETANUS/TDAP  01/21/1978  . COLONOSCOPY  01/21/2009  . INFLUENZA VACCINE  05/14/2018    Social History   Tobacco Use  . Smoking status: Former Smoker    Types: Cigarettes    Last attempt to quit: 10/14/2004    Years since quitting: 13.7  . Smokeless tobacco: Never Used  Substance Use Topics  . Alcohol use: No    Alcohol/week: 2.0 standard drinks    Types: 2 Cans of beer per week  . Drug use: No  -back at work  Review of Systems 12 point ros is negative, per hpi. Physical Exam   BP (!) 163/80   Pulse (!) 57   Temp 97.9 F (36.6 C)   Wt 223 lb 12.8 oz (101.5 kg)   BMI 34.53 kg/m   gen = a xo by 3 in NAD HEENT = no signs of thrush, MMM, EOMI, PERRLA Ext = well healed LUE from picc line removal. No swelling, no c/c/e CBC Lab Results  Component Value Date   WBC 11.8 (H) 05/14/2018   RBC 3.21 (L) 05/14/2018   HGB 10.5 (L) 05/14/2018   HCT 30.4 (L) 05/14/2018   PLT 434 (H) 05/14/2018   MCV 94.7 05/14/2018   MCH 32.7 05/14/2018   MCHC 34.5 05/14/2018   RDW 13.2 05/14/2018   LYMPHSABS 1,779 08/25/2017   MONOABS 0.6 05/19/2010   EOSABS 98 08/25/2017    BMET Lab Results  Component Value Date   NA 138 05/14/2018   K 4.1 05/14/2018   CL 103 05/14/2018   CO2 28 05/14/2018   GLUCOSE 154 (H) 05/14/2018   BUN 14 05/14/2018   CREATININE 0.72 05/14/2018   CALCIUM 8.9 05/14/2018   GFRNONAA >60 05/14/2018   GFRAA >60 05/14/2018      Assessment and Plan  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 Patient has numerous drug allergies which makes it complicated for long term suppression. He is roughly 2 months out from his last surgery. Ideally would like to bridge him to 6 months of  chronic suppression, with a minimum of having 3 months plus of normalization of his inflammatory markers  Will check inflammatory markers  SE monitoring = will check his platelets as linezolid can impact platelets. Will check on a weekly basis. If starts to trend down, we can switch to tedizolid vs. Doing twice a month dalbavancin.   Cephalosporin allergy = it has been greater than 9yr since cephalosporin allergy, may consider rechallenging to see if can tolerate for period of time at next visit.  Treatment plan explained with his case worker

## 2018-07-09 LAB — CBC WITH DIFFERENTIAL/PLATELET
BASOS PCT: 0.5 %
Basophils Absolute: 19 cells/uL (ref 0–200)
EOS PCT: 1.3 %
Eosinophils Absolute: 49 cells/uL (ref 15–500)
HCT: 34.7 % — ABNORMAL LOW (ref 38.5–50.0)
Hemoglobin: 12 g/dL — ABNORMAL LOW (ref 13.2–17.1)
Lymphs Abs: 1379 cells/uL (ref 850–3900)
MCH: 32.5 pg (ref 27.0–33.0)
MCHC: 34.6 g/dL (ref 32.0–36.0)
MCV: 94 fL (ref 80.0–100.0)
MONOS PCT: 12.1 %
MPV: 11.5 fL (ref 7.5–12.5)
Neutro Abs: 1892 cells/uL (ref 1500–7800)
Neutrophils Relative %: 49.8 %
PLATELETS: 220 10*3/uL (ref 140–400)
RBC: 3.69 10*6/uL — AB (ref 4.20–5.80)
RDW: 14 % (ref 11.0–15.0)
TOTAL LYMPHOCYTE: 36.3 %
WBC: 3.8 10*3/uL (ref 3.8–10.8)
WBCMIX: 460 {cells}/uL (ref 200–950)

## 2018-07-09 LAB — BASIC METABOLIC PANEL
BUN: 18 mg/dL (ref 7–25)
CHLORIDE: 99 mmol/L (ref 98–110)
CO2: 25 mmol/L (ref 20–32)
Calcium: 9.5 mg/dL (ref 8.6–10.3)
Creat: 0.8 mg/dL (ref 0.70–1.33)
Glucose, Bld: 98 mg/dL (ref 65–99)
POTASSIUM: 4.2 mmol/L (ref 3.5–5.3)
Sodium: 132 mmol/L — ABNORMAL LOW (ref 135–146)

## 2018-07-09 LAB — C-REACTIVE PROTEIN: CRP: 10.9 mg/L — ABNORMAL HIGH (ref <8.0)

## 2018-07-09 LAB — SEDIMENTATION RATE: SED RATE: 11 mm/h (ref 0–20)

## 2018-07-21 ENCOUNTER — Other Ambulatory Visit: Payer: Self-pay | Admitting: Internal Medicine

## 2018-07-21 DIAGNOSIS — Z881 Allergy status to other antibiotic agents status: Secondary | ICD-10-CM

## 2018-07-21 DIAGNOSIS — T8450XS Infection and inflammatory reaction due to unspecified internal joint prosthesis, sequela: Secondary | ICD-10-CM

## 2018-07-21 DIAGNOSIS — Z5181 Encounter for therapeutic drug level monitoring: Secondary | ICD-10-CM

## 2018-08-17 ENCOUNTER — Telehealth: Payer: Self-pay | Admitting: Behavioral Health

## 2018-08-17 NOTE — Telephone Encounter (Signed)
Patient called stating he feels weak and is currently at work.  Patient states his most recent Hemoglobin was 8.2 which was Tuesday 08/11/2018.  However patient states Hemoglobin was 9.8 on 07/27/2018 and dropped to 8.9 on 08/03/2018.  Patient states he is supposed to have one draw today but he suspects it will be even lower.  Advised patient to go to the ER if he continues to feel weak.  Also informed him that Dr. Baxter Flattery will be made aware.  He states this has all started since he has started Linezolid. Pricilla Riffle RN

## 2018-08-17 NOTE — Telephone Encounter (Signed)
Spoke to Dr. Baxter Flattery verbally and she states Mr. Fandino needs to Stop the Linezolid for now.  Spoke to patients wife Coralyn Mark and she states she will relay this information to Mr. Swallows.  Mr Kassis has lab work he will get done today at [facility].  Coralyn Mark verbalized understanding.  Mr. Beckel has a follow up appointment with Dr. Baxter Flattery 08/27/2018. Pricilla Riffle RN

## 2018-08-18 NOTE — Telephone Encounter (Signed)
Can you call [name] to see how he is feeling since stopping linezolid. I would like him to restart on an abtx on Monday. Marin Comment him know I will call him on Monday to discuss next treatment plan. Please leave me staff msg to how he is feeling. Thank you

## 2018-08-19 NOTE — Telephone Encounter (Signed)
Spoke with Dr. Baxter Flattery who reviewed patient's recent labs and states low hemoglobin is  Due to the Linezolid which he has stopped.  Dr. Baxter Flattery states she will call him Monday to check in on him.  Called [name] and informed him that Dr. Baxter Flattery is aware of his recent lab results.  She would like for him to have repeat lab work next Tuesday 08/25/2018.  Patient verbalized understanding and states if at any time he is feeling worse he will go to the emergency room. Pricilla Riffle RN

## 2018-08-27 ENCOUNTER — Ambulatory Visit (INDEPENDENT_AMBULATORY_CARE_PROVIDER_SITE_OTHER): Payer: Worker's Compensation | Admitting: Internal Medicine

## 2018-08-27 VITALS — BP 187/79 | HR 57 | Temp 97.9°F | Ht 68.0 in | Wt 233.0 lb

## 2018-08-27 DIAGNOSIS — T8450XS Infection and inflammatory reaction due to unspecified internal joint prosthesis, sequela: Secondary | ICD-10-CM

## 2018-08-27 DIAGNOSIS — D649 Anemia, unspecified: Secondary | ICD-10-CM | POA: Diagnosis not present

## 2018-08-27 MED ORDER — DOXYCYCLINE HYCLATE 100 MG PO TABS: 100.0000 mg | ORAL_TABLET | Freq: Two times a day (BID) | ORAL | 3 refills | Status: DC

## 2018-08-27 NOTE — Progress Notes (Signed)
RFV: hx of right hip pji, multiple abtx allergies  Patient ID: [name], male   DOB: [birth year], 59 y.o.   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 He had been on daptomycin but then transitioned him to linezolid which he did not tolerate, with various SE. His blood work did show a trend down mildly in wbc, plt but hgb had taken a drop over the last few weeks, appearance on normocytic anemia as we reviewed his labs. Since stopping his linezolid roughly 10 days ago, he is Feeling better this week.   He does take 1 iron supplement a day Outpatient Encounter Medications as of 08/27/2018  Medication Sig  . amLODipine (NORVASC) 2.5 MG tablet Take 2.5 mg by mouth daily.  Marland Kitchen atorvastatin (LIPITOR) 20 MG tablet Take 20 mg daily by mouth.  . carvedilol (COREG) 12.5 MG tablet Take 12.5 mg by mouth 2 (two) times daily.  Marland Kitchen docusate sodium (COLACE) 100 MG capsule Take 1 capsule (100 mg total) by mouth 2 (two) times daily.  . ferrous sulfate (FERROUSUL) 325 (65 FE) MG tablet Take 1 tablet (325 mg total) by mouth 3 (three) times daily with meals.  Marland Kitchen linezolid (ZYVOX) 600 MG tablet TAKE 1 TABLET BY MOUTH TWICE DAILY  . methocarbamol (ROBAXIN) 500 MG tablet Take 1 tablet (500 mg total) by mouth every 6 (six) hours as needed for muscle spasms. (Patient not taking: Reported on 07/08/2018)  . Multiple Vitamin (MULTIVITAMIN WITH MINERALS) TABS tablet Take 1 tablet by mouth daily.  . polyethylene glycol (MIRALAX / GLYCOLAX) packet Take 17 g by mouth 2 (two) times daily.  . Vitamin D, Ergocalciferol, (DRISDOL) 50000 units CAPS capsule Take 50,000 Units by mouth every Saturday.   No facility-administered encounter medications on file as of 08/27/2018.      Patient Active Problem List   Diagnosis Date Noted  . 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 S/P  left TH revision 05/12/2017  . Acute hyponatremia 12/31/2016  . Acute renal disease 12/31/2016  . Prosthetic joint infection ([place]) 12/31/2016  . Obese 07/19/2015  . Essential hypertension 11/06/2010     Health Maintenance Due  Topic Date Due  . Hepatitis C Screening  1961-02-24  . TETANUS/TDAP  01/21/1978  . COLONOSCOPY  01/21/2009  . INFLUENZA VACCINE  05/14/2018    Social History   Tobacco Use  . Smoking status: Former Smoker    Types: Cigarettes    Last attempt to quit: 10/14/2004    Years since quitting: 13.8  . Smokeless tobacco: Never Used  Substance Use Topics  . Alcohol use: No    Alcohol/week: 2.0 standard drinks    Types: 2 Cans of beer per week   Review of Systems +weakness and fatigue. Not much hip pain. 12 point ros is otherwise negative Physical Exam   BP (!) 187/79   Pulse (!) 57   Temp 97.9 F (36.6 C)   Ht 5\' 8"  (1.727 m)   Wt 233 lb (105.7 kg)   BMI 35.43 kg/m   Physical Exam  Constitutional: He is oriented to person, place, and time. He appears well-developed and well-nourished. No distress.  HENT:  Mouth/Throat: Oropharynx is clear and moist. No oropharyngeal exudate.  Neurological: He is alert and oriented to person, place, and time.  Skin: Skin is warm and dry. No rash noted. No  erythema.  Psychiatric: He has a normal mood and affect. His behavior is normal.    CBC Lab Results  Component Value Date   WBC 3.8 07/08/2018   RBC 3.69 (L) 07/08/2018   HGB 12.0 (L) 07/08/2018   HCT 34.7 (L) 07/08/2018   PLT 220 07/08/2018   MCV 94.0 07/08/2018   MCH 32.5 07/08/2018   MCHC 34.6 07/08/2018   RDW 14.0 07/08/2018   LYMPHSABS 1,379 07/08/2018   MONOABS 0.6 05/19/2010   EOSABS 49 07/08/2018    BMET Lab Results  Component Value Date   NA 132 (L) 07/08/2018   K 4.2 07/08/2018   CL 99 07/08/2018   CO2 25 07/08/2018   GLUCOSE 98 07/08/2018   BUN 18 07/08/2018   CREATININE 0.80 07/08/2018   CALCIUM 9.5 07/08/2018   GFRNONAA >60  05/14/2018   GFRAA >60 05/14/2018      Assessment and Plan  Anemia work up. Not convinced it was due to linezolid se. Initial insult may have been surgery related blood lossbut has not returned back to his baseline  pji = Will start doxy 100mg  bid with food (instructed to stop if having rash)  Will refer to heme to see if further work up is needed  ------------ Addendum - iron levels are low. Will ask to increase to bid. May benefit from iron infusion to replete quicker. Will refer to hematology

## 2018-08-28 LAB — IRON: Iron: 45 ug/dL — ABNORMAL LOW (ref 50–180)

## 2018-08-28 LAB — IRON, TOTAL/TOTAL IRON BINDING CAP
%SAT: 15 % (calc) — ABNORMAL LOW (ref 20–48)
Iron: 45 ug/dL — ABNORMAL LOW (ref 50–180)
TIBC: 300 ug/dL (ref 250–425)

## 2018-08-28 LAB — TEST AUTHORIZATION

## 2018-08-28 LAB — HAPTOGLOBIN: Haptoglobin: 116 mg/dL (ref 43–212)

## 2018-08-28 LAB — B12 AND FOLATE PANEL: Vitamin B-12: 1000 pg/mL (ref 200–1100)

## 2018-08-28 LAB — FERRITIN: FERRITIN: 139 ng/mL (ref 38–380)

## 2018-08-28 LAB — BILIRUBIN, TOTAL: Total Bilirubin: 0.4 mg/dL (ref 0.2–1.2)

## 2018-08-28 LAB — LACTATE DEHYDROGENASE: LDH: 159 U/L (ref 120–250)

## 2018-08-28 LAB — TRANSFERRIN: Transferrin: 233 mg/dL (ref 188–341)

## 2018-09-02 IMAGING — DX DG HIP (WITH OR WITHOUT PELVIS) 1V PORT*L*
2 series · 2 of 2 positions shown · non-contrast
Comparison: None.

CLINICAL DATA: Left hip revision.

EXAM:
DG HIP (WITH OR WITHOUT PELVIS) 1V PORT LEFT

[hip lat]
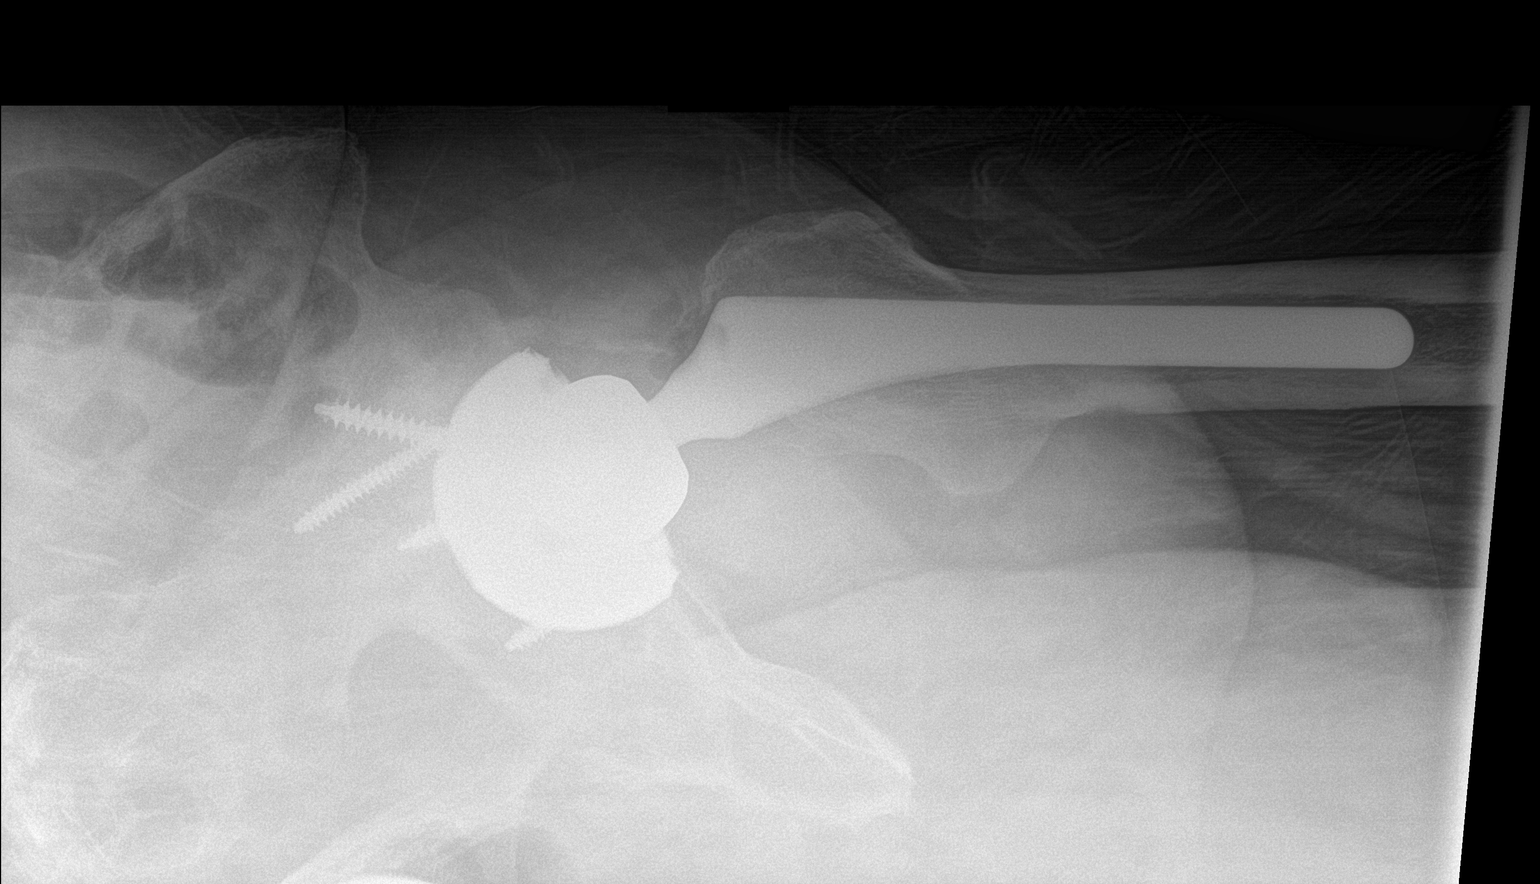

[pelvis ap]
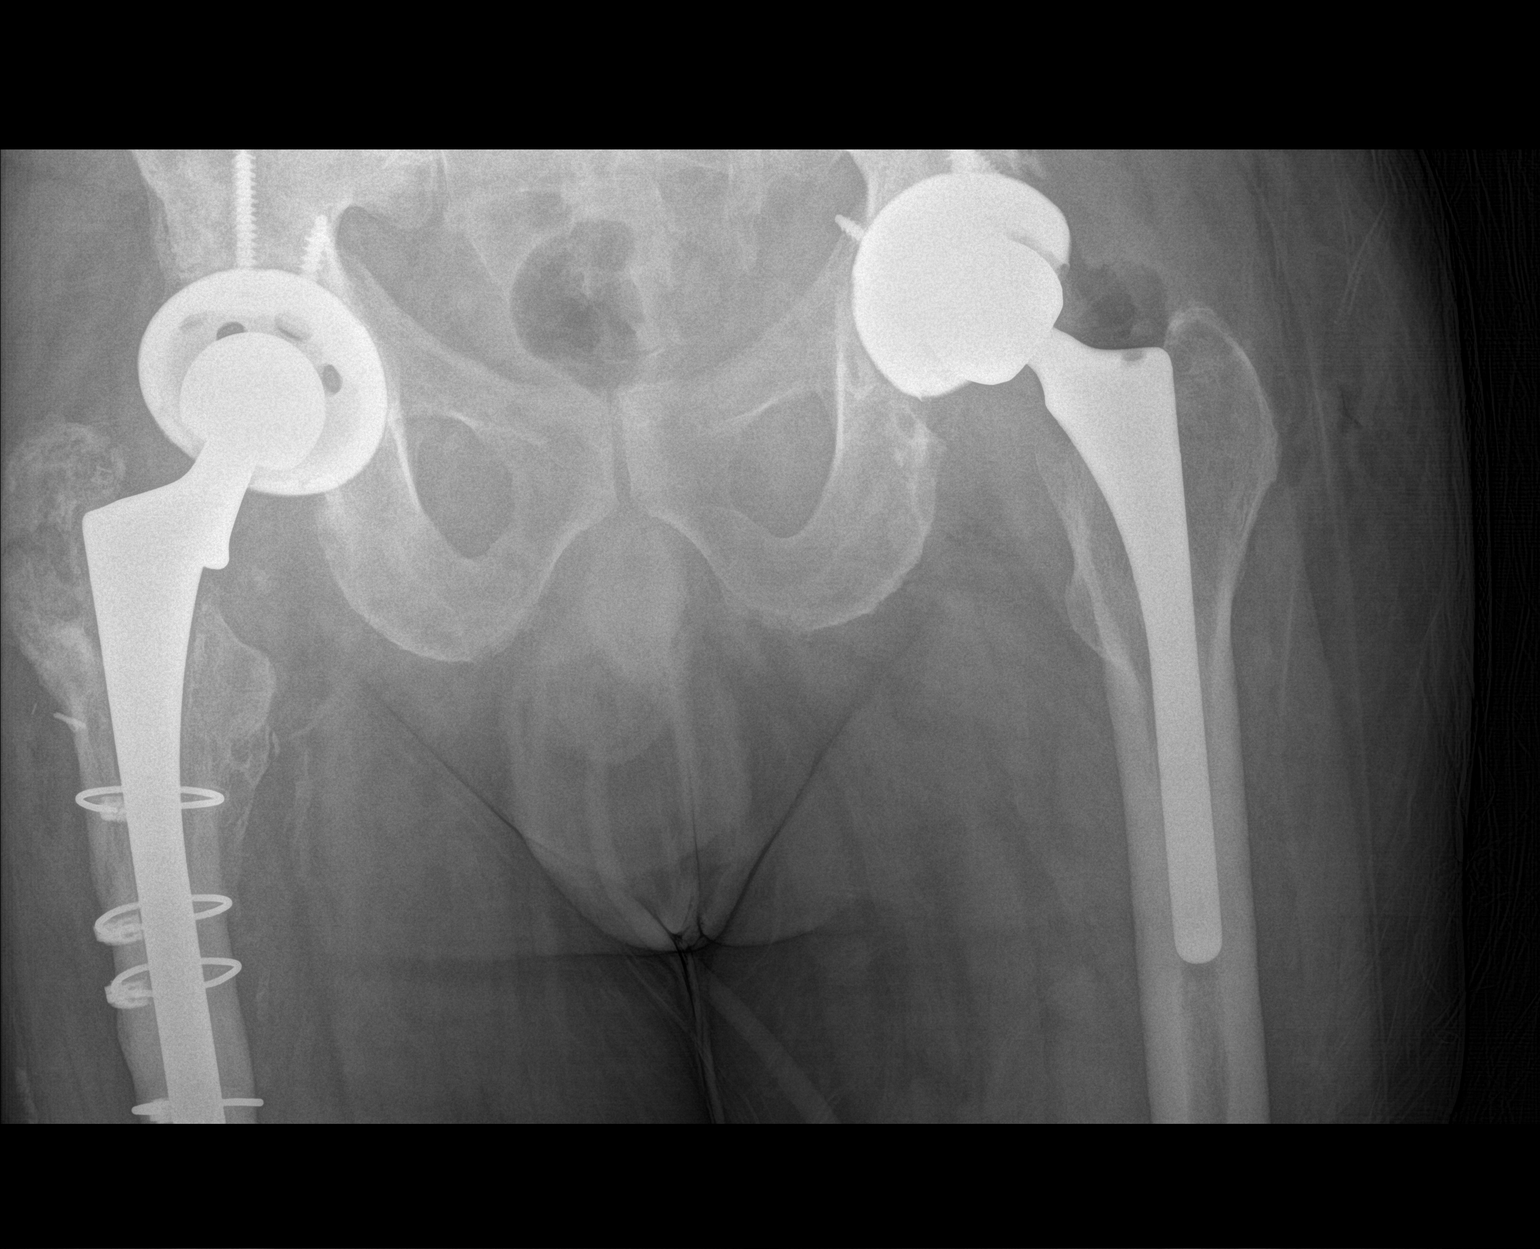

[2 of 2 positions shown; findings below may reference images not displayed]

FINDINGS: The patient is status post left hip revision. The acetabular and
femoral components are in good position. Postsurgical air seen in
the soft tissues the patient is also status post right hip
replacement which stable.
IMPRESSION: Left hip revision with postoperative air.

## 2018-09-22 ENCOUNTER — Other Ambulatory Visit: Payer: Self-pay | Admitting: Behavioral Health

## 2018-09-22 ENCOUNTER — Other Ambulatory Visit: Payer: Self-pay | Admitting: Internal Medicine

## 2018-09-22 DIAGNOSIS — D649 Anemia, unspecified: Secondary | ICD-10-CM

## 2018-09-24 ENCOUNTER — Telehealth: Payer: Self-pay

## 2018-09-24 NOTE — Telephone Encounter (Signed)
Left message with Daine Gip, Claims adjuster for additional insurance information to submit PA for patient medication. Will need insurance BIN number,  PCN, and Rx group.  Claim#: [id number] DATE OF INJURT: 05/15/1990 PHONE: [phone]. CLAIMS ADJUSTER: [phone]

## 2018-09-24 NOTE — Telephone Encounter (Signed)
Received PA for patients Doxycycline Hyclate 100 mg tabs. Unable to do PA due to lack of insurance information,  Attempted to call patient to find out who his workers comp. Is through. Left message for a call back. Jolivue

## 2018-09-29 ENCOUNTER — Ambulatory Visit (INDEPENDENT_AMBULATORY_CARE_PROVIDER_SITE_OTHER): Payer: Worker's Compensation | Admitting: Internal Medicine

## 2018-09-29 ENCOUNTER — Telehealth: Payer: Self-pay | Admitting: Family

## 2018-09-29 ENCOUNTER — Encounter: Payer: Self-pay | Admitting: Internal Medicine

## 2018-09-29 VITALS — BP 187/74 | HR 60 | Temp 98.0°F | Wt 218.0 lb

## 2018-09-29 DIAGNOSIS — Z96643 Presence of artificial hip joint, bilateral: Secondary | ICD-10-CM

## 2018-09-29 DIAGNOSIS — D509 Iron deficiency anemia, unspecified: Secondary | ICD-10-CM | POA: Diagnosis not present

## 2018-09-29 DIAGNOSIS — Z888 Allergy status to other drugs, medicaments and biological substances status: Secondary | ICD-10-CM

## 2018-09-29 DIAGNOSIS — I10 Essential (primary) hypertension: Secondary | ICD-10-CM

## 2018-09-29 DIAGNOSIS — T8451XD Infection and inflammatory reaction due to internal right hip prosthesis, subsequent encounter: Secondary | ICD-10-CM | POA: Diagnosis not present

## 2018-09-29 DIAGNOSIS — D649 Anemia, unspecified: Secondary | ICD-10-CM

## 2018-09-29 DIAGNOSIS — T8450XS Infection and inflammatory reaction due to unspecified internal joint prosthesis, sequela: Secondary | ICD-10-CM

## 2018-09-29 DIAGNOSIS — Z87891 Personal history of nicotine dependence: Secondary | ICD-10-CM

## 2018-09-29 NOTE — Telephone Encounter (Signed)
lmom for pt to return call to office to sch new pt appt. Appt letter mailed for 10/08/18 at 0900

## 2018-09-29 NOTE — Progress Notes (Signed)
Patient ID: [name], male   DOB: [birth year], 59 y.o.   MRN: [id number]  HPI [name] is a 57yo M who has hx of AVN s/p bilateral THA but right- THA c/b metallosis and replacement but in March 2018 had sepsis due to MSSA bacteremia and PJI s/p  I x D with polyethelyne liner exchange and ball exchange on 3/27 and treated for extended period/retreated with long oral suppression through January 2019. Then roughly 6 months later after being off of abtx, he started to develop right hip pain 2/2 MSSA PJI and underwent right THA debridement and exchange on May 09 2018.OR CX showing MSSA (pan sensitive except R PCN and R RIF). He was discharged on daptomycin monotherapy and tolerated well until his picc line dislodged on 9/16, where he finished 6 wk of IV daptomycin, then transitioned to oral linezolid for which he did not tolerate and possibly had BM suppression with anemia, mild leukopenia. After discontinuation of linezolid for which he may have been on for roughly 2-3 wks, he started to improved. We had him off of all abtx for roughly 10 days then started on doxycycling for which he is tolerating ( he had remote hx of rash )  He is feeling considerably better. He has more energy.  Outpatient Encounter Medications as of 09/29/2018  Medication Sig  . amLODipine (NORVASC) 2.5 MG tablet Take 2.5 mg by mouth daily.  Marland Kitchen atorvastatin (LIPITOR) 20 MG tablet Take 20 mg daily by mouth.  . carvedilol (COREG) 12.5 MG tablet Take 12.5 mg by mouth 2 (two) times daily.  Marland Kitchen doxycycline (VIBRA-TABS) 100 MG tablet Take 1 tablet (100 mg total) by mouth 2 (two) times daily.  . Multiple Vitamin (MULTIVITAMIN WITH MINERALS) TABS tablet Take 1 tablet by mouth daily.  . polyethylene glycol (MIRALAX / GLYCOLAX) packet Take 17 g by mouth 2 (two) times daily.  . Vitamin D, Ergocalciferol, (DRISDOL) 50000 units CAPS capsule Take 50,000 Units by mouth every Saturday.  . [DISCONTINUED] docusate sodium (COLACE) 100 MG capsule  Take 1 capsule (100 mg total) by mouth 2 (two) times daily. (Patient not taking: Reported on 09/29/2018)  . [DISCONTINUED] ferrous sulfate (FERROUSUL) 325 (65 FE) MG tablet Take 1 tablet (325 mg total) by mouth 3 (three) times daily with meals.  . [DISCONTINUED] linezolid (ZYVOX) 600 MG tablet TAKE 1 TABLET BY MOUTH TWICE DAILY (Patient not taking: Reported on 09/29/2018)  . [DISCONTINUED] methocarbamol (ROBAXIN) 500 MG tablet Take 1 tablet (500 mg total) by mouth every 6 (six) hours as needed for muscle spasms. (Patient not taking: Reported on 07/08/2018)   No facility-administered encounter medications on file as of 09/29/2018.      Patient Active Problem List   Diagnosis Date Noted  . 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 Acute hyponatremia 12/31/2016  . Acute renal disease 12/31/2016  . Prosthetic joint infection ([place]) 12/31/2016  . Obese 07/19/2015  . Essential hypertension 11/06/2010     Health Maintenance Due  Topic Date Due  . Hepatitis C Screening  1961-06-13  . TETANUS/TDAP  01/21/1978  . COLONOSCOPY  01/21/2009  . INFLUENZA VACCINE  05/14/2018    Social History   Tobacco Use  . Smoking status: Former Smoker    Types: Cigarettes    Last attempt to quit: 10/14/2004    Years since quitting: 13.9  . Smokeless tobacco: Never Used  Substance Use Topics  . Alcohol use: No    Alcohol/week: 2.0  standard drinks    Types: 2 Cans of beer per week  . Drug use: No   Review of Systems Review of Systems  Constitutional: Negative for fever, chills, diaphoresis, activity change, appetite change, fatigue and unexpected weight change.  HENT: Negative for congestion, sore throat, rhinorrhea, sneezing, trouble swallowing and sinus pressure.  Eyes: Negative for photophobia and visual disturbance.  Respiratory: Negative for cough, chest tightness, shortness of breath, wheezing and stridor.  Cardiovascular: Negative for chest pain, palpitations  and leg swelling.  Gastrointestinal: Negative for nausea, vomiting, abdominal pain, diarrhea, constipation, blood in stool, abdominal distention and anal bleeding.  Genitourinary: Negative for dysuria, hematuria, flank pain and difficulty urinating.  Musculoskeletal: Negative for myalgias, back pain, joint swelling, arthralgias and gait problem.  Skin: Negative for color change, pallor, rash and wound.  Neurological: Negative for dizziness, tremors, weakness and light-headedness.  Hematological: Negative for adenopathy. Does not bruise/bleed easily.  Psychiatric/Behavioral: Negative for behavioral problems, confusion, sleep disturbance, dysphoric mood, decreased concentration and agitation.    Physical Exam   BP (!) 187/74   Pulse 60   Temp 98 F (36.7 C) (Oral)   Wt 218 lb (98.9 kg)   BMI 33.15 kg/m    Physical Exam  Constitutional: He is oriented to person, place, and time. He appears well-developed and well-nourished. No distress.  HENT:  Mouth/Throat: Oropharynx is clear and moist. No oropharyngeal exudate.  Skin: right hip incision well healed Neurological: He is alert and oriented to person, place, and time.  Skin: Skin is warm and dry. No rash noted. No erythema.  Psychiatric: He has a normal mood and affect. His behavior is normal.    CBC Lab Results  Component Value Date   WBC 3.8 07/08/2018   RBC 3.69 (L) 07/08/2018   HGB 12.0 (L) 07/08/2018   HCT 34.7 (L) 07/08/2018   PLT 220 07/08/2018   MCV 94.0 07/08/2018   MCH 32.5 07/08/2018   MCHC 34.6 07/08/2018   RDW 14.0 07/08/2018   LYMPHSABS 1,379 07/08/2018   MONOABS 0.6 05/19/2010   EOSABS 49 07/08/2018    BMET Lab Results  Component Value Date   NA 132 (L) 07/08/2018   K 4.2 07/08/2018   CL 99 07/08/2018   CO2 25 07/08/2018   GLUCOSE 98 07/08/2018   BUN 18 07/08/2018   CREATININE 0.80 07/08/2018   CALCIUM 9.5 07/08/2018   GFRNONAA >60 05/14/2018   GFRAA >60 05/14/2018    Lab Results  Component  Value Date   ESRSEDRATE 6 09/29/2018   Lab Results  Component Value Date   CRP 1.6 09/29/2018   Lab Results  Component Value Date   WBC 4.4 09/29/2018   HGB 12.7 (L) 09/29/2018   HCT 37.4 (L) 09/29/2018   MCV 94.2 09/29/2018   PLT 231 09/29/2018     Assessment and Plan  pji = continue on doxy for 2 addn months and see what his crp, sed rate today  Anemia = will check cbc with diff. Unsure if all was due to side effect of linezolid. Appears improving. He has upcoming appt with hematologist to see if any other causes of anemia that need to be addressed. It appears to be mixed picture - with iron deficiency.   Drug allergy = will list linezolid, but remove doxy since he is tolerating  htn = asked him to check BP at home, if SBP 150 or higher, recommend to increase norvasc to 5mg  daily. Sees pcp in january

## 2018-09-30 LAB — CBC WITH DIFFERENTIAL/PLATELET
Absolute Monocytes: 585 cells/uL (ref 200–950)
BASOS ABS: 40 {cells}/uL (ref 0–200)
Basophils Relative: 0.9 %
EOS ABS: 110 {cells}/uL (ref 15–500)
EOS PCT: 2.5 %
HEMATOCRIT: 37.4 % — AB (ref 38.5–50.0)
HEMOGLOBIN: 12.7 g/dL — AB (ref 13.2–17.1)
Lymphs Abs: 1500 cells/uL (ref 850–3900)
MCH: 32 pg (ref 27.0–33.0)
MCHC: 34 g/dL (ref 32.0–36.0)
MCV: 94.2 fL (ref 80.0–100.0)
MONOS PCT: 13.3 %
MPV: 12 fL (ref 7.5–12.5)
NEUTROS ABS: 2165 {cells}/uL (ref 1500–7800)
Neutrophils Relative %: 49.2 %
Platelets: 231 10*3/uL (ref 140–400)
RBC: 3.97 10*6/uL — ABNORMAL LOW (ref 4.20–5.80)
RDW: 14.5 % (ref 11.0–15.0)
Total Lymphocyte: 34.1 %
WBC: 4.4 10*3/uL (ref 3.8–10.8)

## 2018-09-30 LAB — C-REACTIVE PROTEIN: CRP: 1.6 mg/L (ref <8.0)

## 2018-09-30 LAB — SEDIMENTATION RATE: Sed Rate: 6 mm/h (ref 0–20)

## 2018-10-08 ENCOUNTER — Inpatient Hospital Stay: Payer: Worker's Compensation

## 2018-10-08 ENCOUNTER — Inpatient Hospital Stay: Payer: Worker's Compensation | Admitting: Family

## 2018-10-12 ENCOUNTER — Other Ambulatory Visit: Payer: Self-pay | Admitting: Family

## 2018-10-12 DIAGNOSIS — D649 Anemia, unspecified: Secondary | ICD-10-CM

## 2018-10-15 ENCOUNTER — Inpatient Hospital Stay: Payer: Worker's Compensation | Attending: Family | Admitting: Family

## 2018-10-15 ENCOUNTER — Encounter: Payer: Self-pay | Admitting: Family

## 2018-10-15 ENCOUNTER — Inpatient Hospital Stay: Payer: Worker's Compensation | Attending: Family

## 2018-10-15 VITALS — BP 150/66 | HR 58 | Temp 96.9°F | Resp 18 | Ht 67.0 in | Wt 221.8 lb

## 2018-10-15 DIAGNOSIS — Z7982 Long term (current) use of aspirin: Secondary | ICD-10-CM | POA: Diagnosis not present

## 2018-10-15 DIAGNOSIS — D500 Iron deficiency anemia secondary to blood loss (chronic): Secondary | ICD-10-CM

## 2018-10-15 DIAGNOSIS — D509 Iron deficiency anemia, unspecified: Secondary | ICD-10-CM | POA: Insufficient documentation

## 2018-10-15 DIAGNOSIS — Z79899 Other long term (current) drug therapy: Secondary | ICD-10-CM | POA: Insufficient documentation

## 2018-10-15 DIAGNOSIS — Z87891 Personal history of nicotine dependence: Secondary | ICD-10-CM | POA: Diagnosis not present

## 2018-10-15 DIAGNOSIS — I10 Essential (primary) hypertension: Secondary | ICD-10-CM | POA: Insufficient documentation

## 2018-10-15 DIAGNOSIS — K5900 Constipation, unspecified: Secondary | ICD-10-CM | POA: Insufficient documentation

## 2018-10-15 DIAGNOSIS — D649 Anemia, unspecified: Secondary | ICD-10-CM

## 2018-10-15 LAB — CMP (CANCER CENTER ONLY)
ALT: 22 U/L (ref 0–44)
AST: 24 U/L (ref 15–41)
Albumin: 4.4 g/dL (ref 3.5–5.0)
Alkaline Phosphatase: 66 U/L (ref 38–126)
Anion gap: 7 (ref 5–15)
BUN: 24 mg/dL — ABNORMAL HIGH (ref 6–20)
CALCIUM: 9.1 mg/dL (ref 8.9–10.3)
CO2: 26 mmol/L (ref 22–32)
Chloride: 101 mmol/L (ref 98–111)
Creatinine: 1.01 mg/dL (ref 0.61–1.24)
GFR, Est AFR Am: >60 mL/min (ref >60)
GFR, Estimated: >60 mL/min (ref >60)
Glucose, Bld: 107 mg/dL — ABNORMAL HIGH (ref 70–99)
Potassium: 3.8 mmol/L (ref 3.5–5.1)
Sodium: 134 mmol/L — ABNORMAL LOW (ref 135–145)
Total Bilirubin: 0.6 mg/dL (ref 0.3–1.2)
Total Protein: 7 g/dL (ref 6.5–8.1)

## 2018-10-15 LAB — CBC WITH DIFFERENTIAL (CANCER CENTER ONLY)
Abs Immature Granulocytes: 0.01 10*3/uL (ref 0.00–0.07)
BASOS PCT: 1 %
Basophils Absolute: 0 10*3/uL (ref 0.0–0.1)
EOS ABS: 0.2 10*3/uL (ref 0.0–0.5)
EOS PCT: 4 %
HEMATOCRIT: 39.4 % (ref 39.0–52.0)
Hemoglobin: 12.6 g/dL — ABNORMAL LOW (ref 13.0–17.0)
Immature Granulocytes: 0 %
LYMPHS ABS: 1.7 10*3/uL (ref 0.7–4.0)
Lymphocytes Relative: 35 %
MCH: 31.4 pg (ref 26.0–34.0)
MCHC: 32 g/dL (ref 30.0–36.0)
MCV: 98.3 fL (ref 80.0–100.0)
MONOS PCT: 12 %
Monocytes Absolute: 0.6 10*3/uL (ref 0.1–1.0)
NRBC: 0 % (ref 0.0–0.2)
Neutro Abs: 2.4 10*3/uL (ref 1.7–7.7)
Neutrophils Relative %: 48 %
PLATELETS: 219 10*3/uL (ref 150–400)
RBC: 4.01 MIL/uL — ABNORMAL LOW (ref 4.22–5.81)
RDW: 13.7 % (ref 11.5–15.5)
WBC Count: 5 10*3/uL (ref 4.0–10.5)

## 2018-10-15 LAB — LACTATE DEHYDROGENASE: LDH: 173 U/L (ref 98–192)

## 2018-10-15 LAB — RETICULOCYTES
Immature Retic Fract: 8 % (ref 2.3–15.9)
RBC.: 4.01 MIL/uL — ABNORMAL LOW (ref 4.22–5.81)
Retic Count, Absolute: 71.4 10*3/uL (ref 19.0–186.0)
Retic Ct Pct: 1.8 % (ref 0.4–3.1)

## 2018-10-15 LAB — VITAMIN B12: Vitamin B-12: 596 pg/mL (ref 180–914)

## 2018-10-15 LAB — SAVE SMEAR(SSMR), FOR PROVIDER SLIDE REVIEW

## 2018-10-15 NOTE — Progress Notes (Signed)
Hematology/Oncology Consultation   Name: [name]      MRN: [id number]    Location: Room/bed info not found  Date: 10/15/2018 Time:1:47 PM   REFERRING PHYSICIAN: Carlyle Basques, MD  REASON FOR CONSULT: Normocytic anemia    DIAGNOSIS: Iron deficiency anemia   HISTORY OF PRESENT ILLNESS: [name] is a very pleasant 58 yo caucasian gentleman with history of anemia. This has recently been exacerbated by infection in the right hip s/p replacement and treatment with antibiotics. He had the right hip "cleaned out" on July 30th. He was initially on Zyvox. His Hgb then dropped to 9.  He was changed to Doxycycline 100 mg PO BID. He states that he will be on this for a total year.  Iron saturation 2 weeks ago was 15% and ferritin 139. He is currently taking an oral iron supplement. Hgb is now up to 12.6, MCV 98.  He has not noted any episodes of bleeding, no bruising or petechiae.  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 He has also had 4-5 back surgeries including 3 spinal fusions.  He states that he has received blood several times after surgery.  He takes 1 baby aspirin daily.  No family history of anemia.  No personal or familial history of cancer that he is aware of.  No fever, chills, n/v, cough, rash, dizziness, SOB, chest pain, palpitations, abdominal pain or changes in bowel or bladder habits.  He has occasional constipation and uses a stool softener as needed.  No swelling, tenderness, numbness or tingling in her extremities.  No lymphadenopathy noted on exam.  He states that he is due this year for colonoscopy and last scope had 2 benign polyps removed.  He has maintained a good appetite and is staying well hydrated. Her weight is stable.  He quit smoking (2 ppd) 14 years ago.  He will have a coors lite 2-3 times a week.   ROS: All other 10 point review of systems is negative.   PAST MEDICAL  HISTORY:   Past Medical History:  Diagnosis Date  . Arthritis   . Bruit of right carotid artery    diagnosed August 2018  . Cancer ([place])    basal cell CA removed from both legs  . History of blood transfusion   . Hypertension   . PONV (postoperative nausea and vomiting)   . Sleep apnea    hx of had surgery for sleep apnea    ALLERGIES: Allergies  Allergen Reactions  . Cephalexin Rash  . Ace Inhibitors Rash  . Bactrim [Sulfamethoxazole-Trimethoprim] Itching and Rash  . Doxycycline Rash      MEDICATIONS:  Current Outpatient Medications on File Prior to Visit  Medication Sig Dispense Refill  . atorvastatin (LIPITOR) 20 MG tablet Take 20 mg daily by mouth.    . carvedilol (COREG) 12.5 MG tablet Take 12.5 mg by mouth 2 (two) times daily.    Marland Kitchen doxycycline (VIBRA-TABS) 100 MG tablet Take 1 tablet (100 mg total) by mouth 2 (two) times daily. 60 tablet 3  . Multiple Vitamin (MULTIVITAMIN WITH MINERALS) TABS tablet Take 1 tablet by mouth daily.    . polyethylene glycol (MIRALAX / GLYCOLAX) packet Take 17 g by mouth 2 (two) times daily. 14 each 0  . Vitamin D, Ergocalciferol, (DRISDOL) 50000 units CAPS capsule Take 50,000 Units by mouth every Saturday.     No current facility-administered medications on  file prior to visit.      PAST SURGICAL HISTORY Past Surgical History:  Procedure Laterality Date  . ANTERIOR HIP REVISION Left 07/17/2015   Procedure: LEFT POSTERIOR  HIP REVISION;  Surgeon: Paralee Cancel, MD;  Location: [facility];  Service: Orthopedics;  Laterality: Left;  . BACK SURGERY     four back surgeries (3 fusions)  . INCISION AND DRAINAGE HIP Right 01/01/2017   Procedure: IRRIGATION AND DEBRIDEMENT RIGHT TOTAL HIP WITH HEAD AND LINER EXCHANGE AND PLACEMENT OF STIMULIN BEADS;  Surgeon: Paralee Cancel, MD;  Location: [facility];  Service: Orthopedics;  Laterality: Right;  . INCISION AND DRAINAGE HIP Right 01/07/2017   Procedure: IRRIGATION AND DEBRIDEMENT HIP;  Surgeon: Paralee Cancel,  MD;  Location: [facility];  Service: Orthopedics;  Laterality: Right;  . INCISION AND DRAINAGE HIP Right 02/10/2017   Procedure: Repeat IRRIGATION AND DEBRIDEMENT HIP;  Surgeon: Paralee Cancel, MD;  Location: [facility];  Service: Orthopedics;  Laterality: Right;  . INCISION AND DRAINAGE HIP Right 05/12/2018   Procedure: excisional and nonexcisional debridement right hip, head/liner exchange revison;  Surgeon: Paralee Cancel, MD;  Location: [facility];  Service: Orthopedics;  Laterality: Right;  90 mins  . JOINT REPLACEMENT     6 surgeries on right hip, 1 on left hip   including I&D Right hip  . PICC LINE PLACE PERIPHERAL ([place] HX)     present at preop appt. 02/07/17 right arm  . right knee surgery - torn maniscus    . TEE WITHOUT CARDIOVERSION N/A 01/08/2017   Procedure: TRANSESOPHAGEAL ECHOCARDIOGRAM (TEE);  Surgeon: Josue Hector, MD;  Location: [facility] ENDOSCOPY;  Service: Cardiovascular;  Laterality: N/A;  . TONSILLECTOMY     adnoids removed  . TOTAL HIP REVISION Right 05/27/2016   Procedure: TOTAL HIP REVISION;  Surgeon: Paralee Cancel, MD;  Location: [facility];  Service: Orthopedics;  Laterality: Right;  . TOTAL HIP REVISION Left 05/12/2017   Procedure: LEFT TOTAL HIP REVISION POSTERIOR;  Surgeon: Paralee Cancel, MD;  Location: [facility];  Service: Orthopedics;  Laterality: Left;  90 mins  . UVULOPALATOPHARYNGOPLASTY  2002   sleep apnea completely cured now    FAMILY HISTORY: Family History  Problem Relation Age of Onset  . COPD Mother   . Alzheimer's disease Father     SOCIAL HISTORY:  reports that he quit smoking about 14 years ago. His smoking use included cigarettes. He has never used smokeless tobacco. He reports that he does not drink alcohol or use drugs.  PERFORMANCE STATUS: The patient's performance status is 0 - Asymptomatic  PHYSICAL EXAM: Most Recent Vital Signs: There were no vitals taken for this visit. BP (!) 150/66 (BP Location: Right Arm, Patient Position: Sitting)   Pulse (!) 58   Temp  (!) 96.9 F (36.1 C) (Oral)   Resp 18   Ht 5\' 7"  (1.702 m)   Wt 221 lb 12.8 oz (100.6 kg)   SpO2 100%   BMI 34.74 kg/m   General Appearance:    Alert, cooperative, no distress, appears stated age  Head:    Normocephalic, without obvious abnormality, atraumatic  Eyes:    PERRL, conjunctiva/corneas clear, EOM's intact, fundi    benign, both eyes             Throat:   Lips, mucosa, and tongue normal; teeth and gums normal  Neck:   Supple, symmetrical, trachea midline, no adenopathy;       thyroid:  No enlargement/tenderness/nodules; no carotid   bruit or JVD  Back:     Symmetric, no curvature, ROM normal, no CVA tenderness  Lungs:     Clear to auscultation bilaterally, respirations unlabored  Chest wall:    No tenderness or deformity  Heart:    Regular rate and rhythm, S1 and S2 normal, no murmur, rub   or gallop  Abdomen:     Soft, non-tender, bowel sounds active all four quadrants,    no masses, no organomegaly        Extremities:   Extremities normal, atraumatic, no cyanosis or edema  Pulses:   2+ and symmetric all extremities  Skin:   Skin color, texture, turgor normal, no rashes or lesions  Lymph nodes:   Cervical, supraclavicular, and axillary nodes normal  Neurologic:   CNII-XII intact. Normal strength, sensation and reflexes      throughout    LABORATORY DATA:  Results for orders placed or performed in visit on 10/15/18 (from the past 48 hour(s))  CBC with Differential ([place] Only)     Status: Abnormal   Collection Time: 10/15/18  1:29 PM  Result Value Ref Range   WBC Count 5.0 4.0 - 10.5 K/uL   RBC 4.01 (L) 4.22 - 5.81 MIL/uL   Hemoglobin 12.6 (L) 13.0 - 17.0 g/dL   HCT 39.4 39.0 - 52.0 %   MCV 98.3 80.0 - 100.0 fL   MCH 31.4 26.0 - 34.0 pg   MCHC 32.0 30.0 - 36.0 g/dL   RDW 13.7 11.5 - 15.5 %   Platelet Count 219 150 - 400 K/uL   nRBC 0.0 0.0 - 0.2 %   Neutrophils Relative % 48 %   Neutro Abs 2.4 1.7 - 7.7 K/uL   Lymphocytes Relative 35 %   Lymphs  Abs 1.7 0.7 - 4.0 K/uL   Monocytes Relative 12 %   Monocytes Absolute 0.6 0.1 - 1.0 K/uL   Eosinophils Relative 4 %   Eosinophils Absolute 0.2 0.0 - 0.5 K/uL   Basophils Relative 1 %   Basophils Absolute 0.0 0.0 - 0.1 K/uL   Immature Granulocytes 0 %   Abs Immature Granulocytes 0.01 0.00 - 0.07 K/uL    Comment: Performed at [facility] at [facility], [address] [place], [place] [address]  Save Smear [facility])     Status: None   Collection Time: 10/15/18  1:29 PM  Result Value Ref Range   Smear Review SMEAR STAINED AND AVAILABLE FOR REVIEW     Comment: Performed at [facility] Lab at [facility], [address], [place], [place] [address]  Reticulocytes     Status: Abnormal   Collection Time: 10/15/18  1:29 PM  Result Value Ref Range   Retic Ct Pct 1.8 0.4 - 3.1 %   RBC. 4.01 (L) 4.22 - 5.81 MIL/uL   Retic Count, Absolute 71.4 19.0 - 186.0 K/uL   Immature Retic Fract 8.0 2.3 - 15.9 %    Comment: Performed at [facility] Lab at [facility], [address] [place], [place] [address]      RADIOGRAPHY: No results found.     PATHOLOGY: None  ASSESSMENT/PLAN: Mr. Sciarra is a very pleasant 58 yo caucasian gentleman with iron deficiency anemia secondary to chronic disease.  He is asymptomatic at this time and Hgb is now 12.6, MCV 98.  He will start taking Fusion Plus iron supplement PO daily.    At this point we can let him  go from our office. All questions were answered and he is in agreement with the plan. He will contact our office with any questions or concerns. We would be happy to see him again for any future hem/onc issues.   He was discussed with and also seen by Dr. Marin Olp and he is in agreement with the aforementioned.   Laverna Peace, FNP-BC      Addendum: I saw and examined the patient with Judson Roch.  Mr. Sthilaire was a lot of fun to talk to.  I do not see any underlying hematologic  malignancy.  His iron studies showed a ferritin of 139 with an iron saturation of 15%.  He really is not anemic.  I looked at his blood smear under the microscope.  I really do not see anything there that looked suspicious.  We will see how he does with oral iron.  Maybe, he will tolerate this.  We we will plan to see him back as needed.  He is pretty much asymptomatic.  He again is was not anemic.  I do still think that we are adding much to his medical care.  If he does have any issues down the road, we will be more happy to see him back in the office.  We spent about 40 minutes with Mr. Moldovan.  All the time spent face-to-face with him.  He is happy that he does not have to come back to see [place].  He is not all that fond of having to go to doctors offices.   Lattie Haw, MD

## 2018-10-16 LAB — FERRITIN: Ferritin: 90 ng/mL (ref 24–336)

## 2018-10-16 LAB — IRON AND TIBC
Iron: 91 ug/dL (ref 42–163)
Saturation Ratios: 32 % (ref 20–55)
TIBC: 289 ug/dL (ref 202–409)
UIBC: 198 ug/dL (ref 117–376)

## 2018-10-16 LAB — ERYTHROPOIETIN: Erythropoietin: 9.7 m[IU]/mL (ref 2.6–18.5)

## 2018-12-14 ENCOUNTER — Encounter: Payer: Self-pay | Admitting: Internal Medicine

## 2018-12-14 ENCOUNTER — Ambulatory Visit (INDEPENDENT_AMBULATORY_CARE_PROVIDER_SITE_OTHER): Payer: 59 | Admitting: Internal Medicine

## 2018-12-14 DIAGNOSIS — T8450XS Infection and inflammatory reaction due to unspecified internal joint prosthesis, sequela: Secondary | ICD-10-CM

## 2018-12-14 MED ORDER — DOXYCYCLINE HYCLATE 100 MG PO TABS: 100.0000 mg | ORAL_TABLET | Freq: Two times a day (BID) | ORAL | 3 refills | Status: DC

## 2018-12-14 NOTE — Progress Notes (Signed)
Patient ID: [name], male   DOB: [birth year], 58 y.o.   MRN: [id number]  HPI   Outpatient Encounter Medications as of 12/14/2018  Medication Sig  . amLODipine (NORVASC) 5 MG tablet Take 5 mg by mouth daily.  Marland Kitchen atorvastatin (LIPITOR) 20 MG tablet Take 20 mg daily by mouth.  . carvedilol (COREG) 12.5 MG tablet Take 12.5 mg by mouth 2 (two) times daily.  Marland Kitchen doxycycline (VIBRA-TABS) 100 MG tablet Take 1 tablet (100 mg total) by mouth 2 (two) times daily.  . Multiple Vitamin (MULTIVITAMIN WITH MINERALS) TABS tablet Take 1 tablet by mouth daily.  . polyethylene glycol (MIRALAX / GLYCOLAX) packet Take 17 g by mouth 2 (two) times daily.  . Vitamin D, Ergocalciferol, (DRISDOL) 50000 units CAPS capsule Take 50,000 Units by mouth every Saturday.   No facility-administered encounter medications on file as of 12/14/2018.      Patient Active Problem List   Diagnosis Date Noted  . 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 Acute hyponatremia 12/31/2016  . Acute renal disease 12/31/2016  . Prosthetic joint infection ([facility]) 12/31/2016  . Obese 07/19/2015  . Essential hypertension 11/06/2010     Health Maintenance Due  Topic Date Due  . Hepatitis C Screening  01/28/1961  . TETANUS/TDAP  01/21/1978  . COLONOSCOPY  01/21/2009  . INFLUENZA VACCINE  05/14/2018     Review of Systems  Physical Exam   BP (!) 200/95   Pulse 60   Temp 98.1 F (36.7 C)   Wt 229 lb (103.9 kg)   BMI 35.87 kg/m    No results found for: CD4TCELL No results found for: CD4TABS No results found for: HIV1RNAQUANT No results found for: HEPBSAB No results found for: RPR, LABRPR  CBC Lab Results  Component Value Date   WBC 5.0 10/15/2018   RBC 4.01 (L) 10/15/2018   RBC 4.01 (L) 10/15/2018   HGB 12.6 (L) 10/15/2018   HCT 39.4 10/15/2018   PLT 219 10/15/2018   MCV 98.3 10/15/2018   MCH 31.4 10/15/2018   MCHC 32.0 10/15/2018   RDW 13.7 10/15/2018   LYMPHSABS 1.7  10/15/2018   MONOABS 0.6 10/15/2018   EOSABS 0.2 10/15/2018    BMET Lab Results  Component Value Date   NA 134 (L) 10/15/2018   K 3.8 10/15/2018   CL 101 10/15/2018   CO2 26 10/15/2018   GLUCOSE 107 (H) 10/15/2018   BUN 24 (H) 10/15/2018   CREATININE 1.01 10/15/2018   CALCIUM 9.1 10/15/2018   GFRNONAA >60 10/15/2018   GFRAA >60 10/15/2018      Assessment and Plan

## 2019-03-18 ENCOUNTER — Telehealth: Payer: Self-pay | Admitting: Internal Medicine

## 2019-03-18 NOTE — Telephone Encounter (Signed)
COVID-19 Pre-Screening Questions:03/18/19  Do you currently have a fever (>100 F), chills or unexplained body aches? no   Are you currently experiencing new cough, shortness of breath, sore throat, runny nose? no   Have you recently travelled outside the state of [place] in the last 14 days? no   Have you been in contact with someone that is currently pending confirmation of Covid19 testing or has been confirmed to have the [place] virus?  no   **If the patient answers NO to ALL questions -  advise the patient to please call the clinic before coming to the office should any symptoms develop.

## 2019-03-22 ENCOUNTER — Other Ambulatory Visit: Payer: Self-pay

## 2019-03-22 ENCOUNTER — Encounter: Payer: Self-pay | Admitting: Internal Medicine

## 2019-03-22 ENCOUNTER — Ambulatory Visit (INDEPENDENT_AMBULATORY_CARE_PROVIDER_SITE_OTHER): Payer: Worker's Compensation | Admitting: Internal Medicine

## 2019-03-22 VITALS — BP 179/81 | HR 54 | Temp 97.9°F | Wt 237.0 lb

## 2019-03-22 DIAGNOSIS — Z881 Allergy status to other antibiotic agents status: Secondary | ICD-10-CM

## 2019-03-22 DIAGNOSIS — T8450XS Infection and inflammatory reaction due to unspecified internal joint prosthesis, sequela: Secondary | ICD-10-CM

## 2019-03-22 MED ORDER — DOXYCYCLINE HYCLATE 100 MG PO TABS: 100.0000 mg | ORAL_TABLET | Freq: Two times a day (BID) | ORAL | 3 refills | Status: DC

## 2019-03-22 NOTE — Progress Notes (Signed)
RFV: follow up for hx of pji  Patient ID: [name], male   DOB: 07/25/1961, 58 y.o.   MRN: [id number]  HPI  58 yo M goes to work and home, sheltering in place. No close contacts. 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  Reviewed meds all is good, no changes. He continues to take Doxy 100mg  bid without difficulty  Hemoglobin has normalized 13.4 in April.  Works 8hr shifts. And feels fatigued at end of day but no significant swelling   Outpatient Encounter Medications as of 03/22/2019  Medication Sig  . amLODipine (NORVASC) 5 MG tablet Take 5 mg by mouth daily.  Marland Kitchen atorvastatin (LIPITOR) 20 MG tablet Take 20 mg daily by mouth.  . carvedilol (COREG) 12.5 MG tablet Take 12.5 mg by mouth 2 (two) times daily.  Marland Kitchen doxycycline (VIBRA-TABS) 100 MG tablet Take 1 tablet (100 mg total) by mouth 2 (two) times daily.  . Multiple Vitamin (MULTIVITAMIN WITH MINERALS) TABS tablet Take 1 tablet by mouth daily.  . Vitamin D, Ergocalciferol, (DRISDOL) 50000 units CAPS capsule Take 50,000 Units by mouth every Saturday.  . polyethylene glycol (MIRALAX / GLYCOLAX) packet Take 17 g by mouth 2 (two) times daily. (Patient not taking: Reported on 03/22/2019)   No facility-administered encounter medications on file as of 03/22/2019.      Patient Active Problem List   Diagnosis Date Noted  . 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 Acute hyponatremia 12/31/2016  . Acute renal disease 12/31/2016  . Prosthetic joint infection ([place]) 12/31/2016  . Obese 07/19/2015  . Essential hypertension 11/06/2010     Health Maintenance Due  Topic Date Due  . Hepatitis C Screening  [birth year]  . TETANUS/TDAP  01/21/1978  . COLONOSCOPY  01/21/2009    Social History   Tobacco Use  . Smoking status: Former Smoker    Types: Cigarettes    Quit date: 10/14/2004    Years since quitting: 14.4  . Smokeless tobacco: Never Used  Substance Use Topics  . Alcohol use: Yes   Alcohol/week: 2.0 standard drinks    Types: 2 Cans of beer per week  . Drug use: No   Review of Systems Review of Systems  Constitutional: Negative for fever, chills, diaphoresis, activity change, appetite change, fatigue and unexpected weight change.  HENT: Negative for congestion, sore throat, rhinorrhea, sneezing, trouble swallowing and sinus pressure.  Eyes: Negative for photophobia and visual disturbance.  Respiratory: Negative for cough, chest tightness, shortness of breath, wheezing and stridor.  Cardiovascular: Negative for chest pain, palpitations and leg swelling.  Gastrointestinal: Negative for nausea, vomiting, abdominal pain, diarrhea, constipation, blood in stool, abdominal distention and anal bleeding.  Genitourinary: Negative for dysuria, hematuria, flank pain and difficulty urinating.  Musculoskeletal: Negative for myalgias, back pain, joint swelling, arthralgias and gait problem.  Skin: Negative for color change, pallor, rash and wound.  Neurological: Negative for dizziness, tremors, weakness and light-headedness.  Hematological: Negative for adenopathy. Does not bruise/bleed easily.  Psychiatric/Behavioral: Negative for behavioral problems, confusion, sleep disturbance, dysphoric mood, decreased concentration and agitation.    Physical Exam   BP (!) 179/81   Pulse (!) 54   Temp 97.9 F (36.6 C) (Oral)   Wt 237 lb (107.5 kg)   BMI 37.12 kg/m   Did not examine  CBC Lab Results  Component Value Date   WBC 5.0 10/15/2018   RBC 4.01 (L) 10/15/2018   RBC 4.01 (L) 10/15/2018   HGB 12.6 (  L) 10/15/2018   HCT 39.4 10/15/2018   PLT 219 10/15/2018   MCV 98.3 10/15/2018   MCH 31.4 10/15/2018   MCHC 32.0 10/15/2018   RDW 13.7 10/15/2018   LYMPHSABS 1.7 10/15/2018   MONOABS 0.6 10/15/2018   EOSABS 0.2 10/15/2018    BMET Lab Results  Component Value Date   NA 134 (L) 10/15/2018   K 3.8 10/15/2018   CL 101 10/15/2018   CO2 26 10/15/2018   GLUCOSE 107 (H)  10/15/2018   BUN 24 (H) 10/15/2018   CREATININE 1.01 10/15/2018   CALCIUM 9.1 10/15/2018   GFRNONAA >60 10/15/2018   GFRAA >60 10/15/2018   Lab Results  Component Value Date   ESRSEDRATE 2 03/22/2019   Lab Results  Component Value Date   CRP 1.4 03/22/2019      Assessment and Plan  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 Has had difficulty with abtx SE, BM with linezolid.  - appears to tolerate doxycycline for now. Will plan to continue to take for extended period as long as he tolerates, planning for greater than 1 year.

## 2019-03-23 LAB — C-REACTIVE PROTEIN: CRP: 1.4 mg/L (ref <8.0)

## 2019-03-23 LAB — SEDIMENTATION RATE: Sed Rate: 2 mm/h (ref 0–20)

## 2019-06-22 ENCOUNTER — Other Ambulatory Visit: Payer: Self-pay

## 2019-06-22 ENCOUNTER — Ambulatory Visit (INDEPENDENT_AMBULATORY_CARE_PROVIDER_SITE_OTHER): Payer: Worker's Compensation | Admitting: Internal Medicine

## 2019-06-22 ENCOUNTER — Encounter: Payer: Self-pay | Admitting: Internal Medicine

## 2019-06-22 VITALS — BP 185/84 | HR 131 | Temp 97.9°F

## 2019-06-22 DIAGNOSIS — T8450XS Infection and inflammatory reaction due to unspecified internal joint prosthesis, sequela: Secondary | ICD-10-CM | POA: Diagnosis not present

## 2019-06-22 NOTE — Progress Notes (Signed)
Patient ID: [name], male   DOB: 07-23-1961, 58 y.o.   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 Has had difficulty with abtx SE, BM with linezolid. Continues to tolerate doxy without difficulty  No covid exposure  No SE from abtx, no sunburn no pill eso, no yeast infection no diarrhea  Outpatient Encounter Medications as of 06/22/2019  Medication Sig  . amLODipine (NORVASC) 5 MG tablet Take 5 mg by mouth daily.  Marland Kitchen atorvastatin (LIPITOR) 20 MG tablet Take 20 mg daily by mouth.  . carvedilol (COREG) 12.5 MG tablet Take 12.5 mg by mouth 2 (two) times daily.  Marland Kitchen doxycycline (VIBRA-TABS) 100 MG tablet Take 1 tablet (100 mg total) by mouth 2 (two) times daily.  . Multiple Vitamin (MULTIVITAMIN WITH MINERALS) TABS tablet Take 1 tablet by mouth daily.  . polyethylene glycol (MIRALAX / GLYCOLAX) packet Take 17 g by mouth 2 (two) times daily.  . Vitamin D, Ergocalciferol, (DRISDOL) 50000 units CAPS capsule Take 50,000 Units by mouth every Saturday.   No facility-administered encounter medications on file as of 06/22/2019.      Patient Active Problem List   Diagnosis Date Noted  . 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 Acute hyponatremia 12/31/2016  . Acute renal disease 12/31/2016  . Prosthetic joint infection ([place]) 12/31/2016  . Obese 07/19/2015  . Essential hypertension 11/06/2010     Health Maintenance Due  Topic Date Due  . Hepatitis C Screening  06-Dec-1960  . TETANUS/TDAP  01/21/1978  . COLONOSCOPY  01/21/2009  . INFLUENZA VACCINE  05/15/2019    Social History   Socioeconomic History  . Marital status: Married    Spouse name: Not on file  . Number of children: Not on file  . Years of education: Not on file  . Highest education level: Not on file  Occupational History  . Not on file  Social Needs  . Financial resource  strain: Not on file  . Food insecurity    Worry: Not on file    Inability: Not on file  . Transportation needs    Medical: Not on file    Non-medical: Not on file  Tobacco Use  . Smoking status: Former Smoker    Types: Cigarettes    Quit date: 10/14/2004    Years since quitting: 14.7  . Smokeless tobacco: Never Used  Substance and Sexual Activity  . Alcohol use: Yes    Alcohol/week: 2.0 standard drinks    Types: 2 Cans of beer per week  . Drug use: No  . Sexual activity: Not Currently  Lifestyle  . Physical activity    Days per week: Not on file    Minutes per session: Not on file  . Stress: Not on file  Relationships  . Social Herbalist on phone: Not on file    Gets together: Not on file    Attends religious service: Not on file    Active member of club or organization: Not on file    Attends meetings of clubs or organizations: Not on file    Relationship status: Not on file  Other Topics Concern  . Not on file  Social History Narrative  . Not on file    Review of Systems  Constitutional: Negative for fever, chills, diaphoresis, activity change, appetite change, fatigue and unexpected  weight change.  HENT: Negative for congestion, sore throat, rhinorrhea, sneezing, trouble swallowing and sinus pressure.  Eyes: Negative for photophobia and visual disturbance.  Respiratory: Negative for cough, chest tightness, shortness of breath, wheezing and stridor.  Cardiovascular: Negative for chest pain, palpitations and leg swelling.  Gastrointestinal: Negative for nausea, vomiting, abdominal pain, diarrhea, constipation, blood in stool, abdominal distention and anal bleeding.  Genitourinary: Negative for dysuria, hematuria, flank pain and difficulty urinating.  Musculoskeletal: Negative for myalgias, back pain, joint swelling, arthralgias and gait problem.  Skin: Negative for color change, pallor, rash and wound.  Neurological: Negative for dizziness, tremors, weakness  and light-headedness.  Hematological: Negative for adenopathy. Does not bruise/bleed easily.  Psychiatric/Behavioral: Negative for behavioral problems, confusion, sleep disturbance, dysphoric mood, decreased concentration and agitation.    Physical Exam   BP (!) 185/84   Pulse (!) 131   Temp 97.9 F (36.6 C)    No exam CBC Lab Results  Component Value Date   WBC 5.0 10/15/2018   RBC 4.01 (L) 10/15/2018   RBC 4.01 (L) 10/15/2018   HGB 12.6 (L) 10/15/2018   HCT 39.4 10/15/2018   PLT 219 10/15/2018   MCV 98.3 10/15/2018   MCH 31.4 10/15/2018   MCHC 32.0 10/15/2018   RDW 13.7 10/15/2018   LYMPHSABS 1.7 10/15/2018   MONOABS 0.6 10/15/2018   EOSABS 0.2 10/15/2018    BMET Lab Results  Component Value Date   NA 134 (L) 10/15/2018   K 3.8 10/15/2018   CL 101 10/15/2018   CO2 26 10/15/2018   GLUCOSE 107 (H) 10/15/2018   BUN 24 (H) 10/15/2018   CREATININE 1.01 10/15/2018   CALCIUM 9.1 10/15/2018   GFRNONAA >60 10/15/2018   GFRAA >60 10/15/2018      Assessment and Plan mssa  pji =  Continue to refill doxycycline 100mg  bid. Appears to tolerate without difficulty  rtc in 4 mo

## 2019-08-22 ENCOUNTER — Other Ambulatory Visit: Payer: Self-pay | Admitting: Internal Medicine

## 2019-08-22 DIAGNOSIS — T8450XS Infection and inflammatory reaction due to unspecified internal joint prosthesis, sequela: Secondary | ICD-10-CM

## 2019-10-04 ENCOUNTER — Telehealth: Payer: Self-pay

## 2019-10-04 ENCOUNTER — Other Ambulatory Visit: Payer: Self-pay

## 2019-10-04 NOTE — Telephone Encounter (Signed)
Contacted patient's adjuster Janna Arch) who is working with patient and his worker's comp. Contacted Ms. Byrd as there was a problem with the patient's antibioitics and prior authorization. Ms. Felton Clinton gave the number to corporate pharmacy [phone]) who forwarded the call to customer service. CS stated that moving forward we should not have to do any PA's for the medication as it was an oversight on their end. Any further issues, call the number listed above and it will be sorted.   Christopher Padovano Lorita Officer, RN

## 2019-10-21 ENCOUNTER — Encounter: Payer: Self-pay | Admitting: Internal Medicine

## 2019-10-21 ENCOUNTER — Other Ambulatory Visit: Payer: Self-pay

## 2019-10-21 ENCOUNTER — Ambulatory Visit (INDEPENDENT_AMBULATORY_CARE_PROVIDER_SITE_OTHER): Payer: Worker's Compensation | Admitting: Internal Medicine

## 2019-10-21 DIAGNOSIS — T8450XS Infection and inflammatory reaction due to unspecified internal joint prosthesis, sequela: Secondary | ICD-10-CM | POA: Diagnosis not present

## 2019-10-21 MED ORDER — DOXYCYCLINE HYCLATE 100 MG PO TABS: 100.0000 mg | ORAL_TABLET | Freq: Two times a day (BID) | ORAL | 3 refills | Status: DC

## 2019-10-21 NOTE — Progress Notes (Signed)
Patient ID: [name], male   DOB: [birth year], 59 y.o.   MRN: [id number]  HPI 59yo M with history of recurrent MSSA PJI and underwent right THA debridement and exchangex 2.OR CX showing MSSA (pan sensitive except R PCN and R RIF).Has had difficulty with abtx SE, BM suppression with linezolid. Continues to tolerate doxy without difficulty - he was last seen in September. Continues to do well with doxycycline.   No fungal skin rash nor diarrhea.  No complaints to right hip.  Has stayed covid free. No outbreaks at work.   Outpatient Encounter Medications as of 10/21/2019  Medication Sig  . amLODipine (NORVASC) 2.5 MG tablet Take 2.5 mg by mouth daily.  Marland Kitchen amLODipine (NORVASC) 5 MG tablet Take 5 mg by mouth daily.  Marland Kitchen atorvastatin (LIPITOR) 20 MG tablet Take 20 mg daily by mouth.  . carvedilol (COREG) 12.5 MG tablet Take 12.5 mg by mouth 2 (two) times daily.  . celecoxib (CELEBREX) 200 MG capsule Take 200 mg by mouth daily.  Marland Kitchen doxycycline (VIBRA-TABS) 100 MG tablet TAKE 1 TABLET BY MOUTH TWICE A DAY  . Multiple Vitamin (MULTIVITAMIN WITH MINERALS) TABS tablet Take 1 tablet by mouth daily.  . polyethylene glycol (MIRALAX / GLYCOLAX) packet Take 17 g by mouth 2 (two) times daily.  . Vitamin D, Ergocalciferol, (DRISDOL) 50000 units CAPS capsule Take 50,000 Units by mouth every Saturday.   No facility-administered encounter medications on file as of 10/21/2019.     Patient Active Problem List   Diagnosis Date Noted  . 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 Acute hyponatremia 12/31/2016  . Acute renal disease 12/31/2016  . Prosthetic joint infection ([place]) 12/31/2016  . Obese 07/19/2015  . Essential hypertension 11/06/2010     Health Maintenance Due  Topic Date Due  . Hepatitis C Screening  02/13/1961  . TETANUS/TDAP  01/21/1978  . COLONOSCOPY  01/21/2009  . INFLUENZA VACCINE  05/15/2019    Soc hx: no smoking, rare alcohol  use  Review of Systems Review of Systems  Constitutional: Negative for fever, chills, diaphoresis, activity change, appetite change, fatigue and unexpected weight change.  HENT: Negative for congestion, sore throat, rhinorrhea, sneezing, trouble swallowing and sinus pressure.  Eyes: Negative for photophobia and visual disturbance.  Respiratory: Negative for cough, chest tightness, shortness of breath, wheezing and stridor.  Cardiovascular: Negative for chest pain, palpitations and leg swelling.  Gastrointestinal: Negative for nausea, vomiting, abdominal pain, diarrhea, constipation, blood in stool, abdominal distention and anal bleeding.  Genitourinary: Negative for dysuria, hematuria, flank pain and difficulty urinating.  Musculoskeletal: Negative for myalgias, back pain, joint swelling, arthralgias and gait problem.  Skin: Negative for color change, pallor, rash and wound.  Neurological: Negative for dizziness, tremors, weakness and light-headedness.  Hematological: Negative for adenopathy. Does not bruise/bleed easily.  Psychiatric/Behavioral: Negative for behavioral problems, confusion, sleep disturbance, dysphoric mood, decreased concentration and agitation.    Physical Exam  BP (!) 184/80   Pulse (!) 56   Temp 98 F (36.7 C)   Wt 236 lb (107 kg)   BMI 36.96 kg/m  gen = a xo by in NAD HEENT = no signs of thrush Neuro = normal gait. CN2-12 intact CBC Lab Results  Component Value Date   WBC 5.0 10/15/2018   RBC 4.01 (L) 10/15/2018   RBC 4.01 (L) 10/15/2018   HGB 12.6 (L) 10/15/2018   HCT 39.4 10/15/2018   PLT 219 10/15/2018   MCV 98.3  10/15/2018   MCH 31.4 10/15/2018   MCHC 32.0 10/15/2018   RDW 13.7 10/15/2018   LYMPHSABS 1.7 10/15/2018   MONOABS 0.6 10/15/2018   EOSABS 0.2 10/15/2018    BMET Lab Results  Component Value Date   NA 134 (L) 10/15/2018   K 3.8 10/15/2018   CL 101 10/15/2018   CO2 26 10/15/2018   GLUCOSE 107 (H) 10/15/2018   BUN 24 (H)  10/15/2018   CREATININE 1.01 10/15/2018   CALCIUM 9.1 10/15/2018   GFRNONAA >60 10/15/2018   GFRAA >60 10/15/2018    Assessment and Plan pji of right total hip infection = plan for chronic suppression with doxycycline. Will refill 90d for the patient. Plan on taking for extended period of time.

## 2019-10-25 ENCOUNTER — Ambulatory Visit: Payer: Managed Care, Other (non HMO) | Admitting: Internal Medicine

## 2020-02-22 ENCOUNTER — Encounter: Payer: Self-pay | Admitting: Internal Medicine

## 2020-02-22 ENCOUNTER — Ambulatory Visit (INDEPENDENT_AMBULATORY_CARE_PROVIDER_SITE_OTHER): Payer: Worker's Compensation | Admitting: Internal Medicine

## 2020-02-22 ENCOUNTER — Other Ambulatory Visit: Payer: Self-pay

## 2020-02-22 DIAGNOSIS — T8450XS Infection and inflammatory reaction due to unspecified internal joint prosthesis, sequela: Secondary | ICD-10-CM | POA: Diagnosis not present

## 2020-02-22 MED ORDER — DOXYCYCLINE HYCLATE 100 MG PO TABS: 100.0000 mg | ORAL_TABLET | Freq: Two times a day (BID) | ORAL | 3 refills | Status: DC

## 2020-02-22 NOTE — Progress Notes (Signed)
RFV: chronic pji infection  Patient ID: [name], male   DOB: Feb 18, 1961, 59 y.o.   MRN: [id number]  HPI 59yo M with right hip PJI on chronic suppression with doxycycline, last surgery on 05/12/18 (2nd time ) OR CX showing MSSA (pan sensitive except R PCN and R RIF).Has had difficulty with abtx SE, BM suppression with linezolid.Continues to tolerate doxy without difficulty - he saw olin last year due to some itching in the joint and was evaluated which didn't find cause. Otherwise is doing well. High blood pressure reading in office is unusual for him. He is well controled at home  PROCEDURE: 1.  Sharp excisional and debridement, right hip from an incision 8 inches long, including the skin, subcutaneous tissue, nonviable pseudocapsule bone. 2.  Nonexcisional debridement, right hip wound with 6 liters normal saline solution. 3.  Revision acetabular liner with a size 64 x 36+4 neutral Altrx liner and a 36+5 TS ceramic ball. Outpatient Encounter Medications as of 02/22/2020  Medication Sig  . doxycycline (VIBRA-TABS) 100 MG tablet Take 1 tablet (100 mg total) by mouth 2 (two) times daily.  Marland Kitchen amLODipine (NORVASC) 2.5 MG tablet Take 2.5 mg by mouth daily.  Marland Kitchen amLODipine (NORVASC) 5 MG tablet Take 5 mg by mouth daily.  Marland Kitchen atorvastatin (LIPITOR) 20 MG tablet Take 20 mg daily by mouth.  . carvedilol (COREG) 12.5 MG tablet Take 12.5 mg by mouth 2 (two) times daily.  . celecoxib (CELEBREX) 200 MG capsule Take 200 mg by mouth daily.  . Multiple Vitamin (MULTIVITAMIN WITH MINERALS) TABS tablet Take 1 tablet by mouth daily.  . polyethylene glycol (MIRALAX / GLYCOLAX) packet Take 17 g by mouth 2 (two) times daily.  . Vitamin D, Ergocalciferol, (DRISDOL) 50000 units CAPS capsule Take 50,000 Units by mouth every Saturday.   No facility-administered encounter medications on file as of 02/22/2020.     Patient Active Problem List   Diagnosis Date Noted  . 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 Acute hyponatremia 12/31/2016  . Acute renal disease 12/31/2016  . Prosthetic joint infection ([place]) 12/31/2016  . Obese 07/19/2015  . Essential hypertension 11/06/2010     Health Maintenance Due  Topic Date Due  . Hepatitis C Screening  Never done  . COVID-19 Vaccine (1) Never done  . TETANUS/TDAP  Never done  . COLONOSCOPY  Never done    Social History   Tobacco Use  . Smoking status: Former Smoker    Types: Cigarettes    Quit date: 10/14/2004    Years since quitting: 15.3  . Smokeless tobacco: Never Used  Substance Use Topics  . Alcohol use: Yes    Alcohol/week: 2.0 standard drinks    Types: 2 Cans of beer per week  . Drug use: No   Review of Systems Review of Systems  Constitutional: Negative for fever, chills, diaphoresis, activity change, appetite change, fatigue and unexpected weight change.  HENT: Negative for congestion, sore throat, rhinorrhea, sneezing, trouble swallowing and sinus pressure.  Eyes: Negative for photophobia and visual disturbance.  Respiratory: Negative for cough, chest tightness, shortness of breath, wheezing and stridor.  Cardiovascular: Negative for chest pain, palpitations and leg swelling.  Gastrointestinal: Negative for nausea, vomiting, abdominal pain, diarrhea, constipation, blood in stool, abdominal distention and anal bleeding.  Genitourinary: Negative for dysuria, hematuria, flank pain and difficulty urinating.  Musculoskeletal: Negative for myalgias, back pain, joint swelling, arthralgias and gait problem.  Skin: Negative for color change, pallor,  rash and wound.  Neurological: Negative for dizziness, tremors, weakness and light-headedness.  Hematological: Negative for adenopathy. Does not bruise/bleed easily.  Psychiatric/Behavioral: Negative for behavioral problems, confusion, sleep disturbance, dysphoric mood, decreased concentration and agitation.    Physical Exam   BP (!) 183/81   Pulse (!) 57    Wt 234 lb (106.1 kg)   BMI 36.65 kg/m    No exam CBC Lab Results  Component Value Date   WBC 5.0 10/15/2018   RBC 4.01 (L) 10/15/2018   RBC 4.01 (L) 10/15/2018   HGB 12.6 (L) 10/15/2018   HCT 39.4 10/15/2018   PLT 219 10/15/2018   MCV 98.3 10/15/2018   MCH 31.4 10/15/2018   MCHC 32.0 10/15/2018   RDW 13.7 10/15/2018   LYMPHSABS 1.7 10/15/2018   MONOABS 0.6 10/15/2018   EOSABS 0.2 10/15/2018    BMET Lab Results  Component Value Date   NA 134 (L) 10/15/2018   K 3.8 10/15/2018   CL 101 10/15/2018   CO2 26 10/15/2018   GLUCOSE 107 (H) 10/15/2018   BUN 24 (H) 10/15/2018   CREATININE 1.01 10/15/2018   CALCIUM 9.1 10/15/2018   GFRNONAA >60 10/15/2018   GFRAA >60 10/15/2018      Assessment and Plan Chronic MSSA PJI infection of hip = since he tolerates chronic suppression. We will plan to continue with doxy 100mg  bid x 6 months+  We will see back in 6 months

## 2020-02-25 ENCOUNTER — Telehealth: Payer: Self-pay | Admitting: *Deleted

## 2020-02-25 NOTE — Telephone Encounter (Signed)
Received signed request for 5/11 office note to be faxed to patient's case manager Krystal Clark, RN CCM CDMS.   Done. Landis Gandy, RN

## 2020-08-16 ENCOUNTER — Ambulatory Visit: Payer: 59 | Admitting: Internal Medicine

## 2020-09-12 ENCOUNTER — Encounter: Payer: Self-pay | Admitting: Internal Medicine

## 2020-09-12 ENCOUNTER — Other Ambulatory Visit: Payer: Self-pay

## 2020-09-12 ENCOUNTER — Ambulatory Visit (INDEPENDENT_AMBULATORY_CARE_PROVIDER_SITE_OTHER): Payer: Worker's Compensation | Admitting: Internal Medicine

## 2020-09-12 VITALS — BP 181/83 | HR 59 | Temp 97.4°F | Wt 230.0 lb

## 2020-09-12 DIAGNOSIS — Z0000 Encounter for general adult medical examination without abnormal findings: Secondary | ICD-10-CM | POA: Diagnosis not present

## 2020-09-12 DIAGNOSIS — T8450XA Infection and inflammatory reaction due to unspecified internal joint prosthesis, initial encounter: Secondary | ICD-10-CM

## 2020-09-12 DIAGNOSIS — T8450XS Infection and inflammatory reaction due to unspecified internal joint prosthesis, sequela: Secondary | ICD-10-CM

## 2020-09-12 DIAGNOSIS — D649 Anemia, unspecified: Secondary | ICD-10-CM

## 2020-09-12 DIAGNOSIS — Z881 Allergy status to other antibiotic agents status: Secondary | ICD-10-CM | POA: Diagnosis not present

## 2020-09-12 MED ORDER — DOXYCYCLINE HYCLATE 100 MG PO TABS: 100.0000 mg | ORAL_TABLET | Freq: Two times a day (BID) | ORAL | 3 refills | Status: DC

## 2020-09-12 NOTE — Patient Instructions (Signed)
Remember to wear sunscreen and wear hat starting the spring time to minimize sunburn associated with doxycycline

## 2020-09-12 NOTE — Progress Notes (Signed)
RFV: follow up for chronic mssa prosthetic joint infection  Patient ID: [name], male   DOB: 05/29/1961, 59 y.o.   MRN: [id number]  HPI [name] is a 59yo M with history of right hip PJI on chronic suppression with doxycycline, last surgery on 05/12/18 (2nd time ) OR CX showing MSSA (pan sensitive except R PCN and R RIF).Has had difficulty with abtx SE, BMsuppressionwith linezolid.Continues to tolerate doxy without difficulty.  No heartburn, no headache, no gi symptoms.  Outpatient Encounter Medications as of 09/12/2020  Medication Sig  . amLODipine (NORVASC) 2.5 MG tablet Take 2.5 mg by mouth daily.  Marland Kitchen amLODipine (NORVASC) 5 MG tablet Take 5 mg by mouth daily.  Marland Kitchen atorvastatin (LIPITOR) 20 MG tablet Take 20 mg daily by mouth.  . carvedilol (COREG) 12.5 MG tablet Take 12.5 mg by mouth 2 (two) times daily.  . celecoxib (CELEBREX) 200 MG capsule Take 200 mg by mouth daily.  Marland Kitchen doxycycline (VIBRA-TABS) 100 MG tablet Take 1 tablet (100 mg total) by mouth 2 (two) times daily.  . Multiple Vitamin (MULTIVITAMIN WITH MINERALS) TABS tablet Take 1 tablet by mouth daily.  . polyethylene glycol (MIRALAX / GLYCOLAX) packet Take 17 g by mouth 2 (two) times daily.  . Vitamin D, Ergocalciferol, (DRISDOL) 50000 units CAPS capsule Take 50,000 Units by mouth every Saturday.   No facility-administered encounter medications on file as of 09/12/2020.     Patient Active Problem List   Diagnosis Date Noted  . 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 Acute hyponatremia 12/31/2016  . Acute renal disease 12/31/2016  . Prosthetic joint infection ([place]) 12/31/2016  . Obese 07/19/2015  . Essential hypertension 11/06/2010     Health Maintenance Due  Topic Date Due  . Hepatitis C Screening  Never done  . COVID-19 Vaccine (1) Never done  . TETANUS/TDAP  Never done  . COLONOSCOPY  Never done  . INFLUENZA VACCINE  05/14/2020    Soc hx: no smoking or  drinking  Review of Systems  Constitutional: Negative for fever, chills, diaphoresis, activity change, appetite change, fatigue and unexpected weight change.  HENT: Negative for congestion, sore throat, rhinorrhea, sneezing, trouble swallowing and sinus pressure.  Eyes: Negative for photophobia and visual disturbance.  Respiratory: Negative for cough, chest tightness, shortness of breath, wheezing and stridor.  Cardiovascular: Negative for chest pain, palpitations and leg swelling.  Gastrointestinal: Negative for nausea, vomiting, abdominal pain, diarrhea, constipation, blood in stool, abdominal distention and anal bleeding.  Genitourinary: Negative for dysuria, hematuria, flank pain and difficulty urinating.  Musculoskeletal: Negative for myalgias, back pain, joint swelling, arthralgias and gait problem.  Skin: Negative for color change, pallor, rash and wound.  Neurological: Negative for dizziness, tremors, weakness and light-headedness.  Hematological: Negative for adenopathy. Does not bruise/bleed easily.  Psychiatric/Behavioral: Negative for behavioral problems, confusion, sleep disturbance, dysphoric mood, decreased concentration and agitation.    Physical Exam   Wt 230 lb (104.3 kg)   BMI 36.02 kg/m     CBC Lab Results  Component Value Date   WBC 5.0 10/15/2018   RBC 4.01 (L) 10/15/2018   RBC 4.01 (L) 10/15/2018   HGB 12.6 (L) 10/15/2018   HCT 39.4 10/15/2018   PLT 219 10/15/2018   MCV 98.3 10/15/2018   MCH 31.4 10/15/2018   MCHC 32.0 10/15/2018   RDW 13.7 10/15/2018   LYMPHSABS 1.7 10/15/2018   MONOABS 0.6 10/15/2018   EOSABS 0.2 10/15/2018    BMET Lab Results  Component Value Date   NA 134 (L) 10/15/2018   K 3.8 10/15/2018   CL 101 10/15/2018   CO2 26 10/15/2018   GLUCOSE 107 (H) 10/15/2018   BUN 24 (H) 10/15/2018   CREATININE 1.01 10/15/2018   CALCIUM 9.1 10/15/2018   GFRNONAA >60 10/15/2018   GFRAA >60 10/15/2018   Lab Results  Component Value Date    ESRSEDRATE 2 09/12/2020   Lab Results  Component Value Date   CRP 0.8 09/12/2020      Assessment and Plan Chronic pji = will continue on doxycycline 100mg  bid. He follows up with dr Alvan Dame next august 2022. Anticipate lifelong abtx if possible  Long term medication management = will check sed rate and crp and cmp -which appear at [facility].  Health maintenance = has had his moderna booster at the end of October 2021  Normocytic anemia = appears resolved.

## 2020-09-13 LAB — SEDIMENTATION RATE: Sed Rate: 2 mm/h (ref 0–20)

## 2020-09-13 LAB — COMPREHENSIVE METABOLIC PANEL
AG Ratio: 2 (calc) (ref 1.0–2.5)
ALT: 20 U/L (ref 9–46)
AST: 25 U/L (ref 10–35)
Albumin: 4.5 g/dL (ref 3.6–5.1)
Alkaline phosphatase (APISO): 56 U/L (ref 35–144)
BUN: 20 mg/dL (ref 7–25)
CO2: 23 mmol/L (ref 20–32)
Calcium: 9.6 mg/dL (ref 8.6–10.3)
Chloride: 99 mmol/L (ref 98–110)
Creat: 0.87 mg/dL (ref 0.70–1.25)
Globulin: 2.2 g/dL (calc) (ref 1.9–3.7)
Glucose, Bld: 85 mg/dL (ref 65–99)
Potassium: 4.3 mmol/L (ref 3.5–5.3)
Sodium: 131 mmol/L — ABNORMAL LOW (ref 135–146)
Total Bilirubin: 0.7 mg/dL (ref 0.2–1.2)
Total Protein: 6.7 g/dL (ref 6.1–8.1)

## 2020-09-13 LAB — C-REACTIVE PROTEIN: CRP: 0.8 mg/L (ref <8.0)

## 2021-03-13 ENCOUNTER — Ambulatory Visit: Payer: 59 | Admitting: Internal Medicine

## 2021-03-19 ENCOUNTER — Encounter: Payer: Self-pay | Admitting: Internal Medicine

## 2021-03-19 ENCOUNTER — Other Ambulatory Visit: Payer: Self-pay

## 2021-03-19 ENCOUNTER — Ambulatory Visit (INDEPENDENT_AMBULATORY_CARE_PROVIDER_SITE_OTHER): Payer: Worker's Compensation | Admitting: Internal Medicine

## 2021-03-19 VITALS — BP 167/79 | HR 56 | Temp 97.9°F | Ht 67.0 in | Wt 240.0 lb

## 2021-03-19 DIAGNOSIS — T8450XS Infection and inflammatory reaction due to unspecified internal joint prosthesis, sequela: Secondary | ICD-10-CM

## 2021-03-19 DIAGNOSIS — T8450XD Infection and inflammatory reaction due to unspecified internal joint prosthesis, subsequent encounter: Secondary | ICD-10-CM

## 2021-03-19 NOTE — Progress Notes (Signed)
RFV: follow up for chronic MSSA hip pji infection  Patient ID: [name], male   DOB: 06/28/1961, 60 y.o.   MRN: [id number]  HPI  60yo M with redo MSSA right hip pji, July 2019, on chronic doxycycline.  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  Getting used to retirement. Keeps busy by working at home and yard work. Walks every morning, feels good on hips. Starting to extend to up to 1 mile.    Outpatient Encounter Medications as of 03/19/2021  Medication Sig  . amLODipine (NORVASC) 2.5 MG tablet Take 2.5 mg by mouth daily.  Marland Kitchen atorvastatin (LIPITOR) 20 MG tablet Take 20 mg daily by mouth.  . carvedilol (COREG) 12.5 MG tablet Take 12.5 mg by mouth 2 (two) times daily.  . celecoxib (CELEBREX) 200 MG capsule Take 200 mg by mouth daily.  Marland Kitchen doxycycline (VIBRA-TABS) 100 MG tablet Take 1 tablet (100 mg total) by mouth 2 (two) times daily.  Marland Kitchen ketoconazole (NIZORAL) 2 % cream 1 app  . losartan (COZAAR) 50 MG tablet Take 1 tablet by mouth daily.  . Multiple Vitamin (MULTIVITAMIN WITH MINERALS) TABS tablet Take 1 tablet by mouth daily.  . [DISCONTINUED] amLODipine (NORVASC) 5 MG tablet Take 5 mg by mouth daily.  . [DISCONTINUED] polyethylene glycol (MIRALAX / GLYCOLAX) packet Take 17 g by mouth 2 (two) times daily.  . [DISCONTINUED] Vitamin D, Ergocalciferol, (DRISDOL) 50000 units CAPS capsule Take 50,000 Units by mouth every Saturday.   No facility-administered encounter medications on file as of 03/19/2021.     Patient Active Problem List   Diagnosis Date Noted  . 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 Acute hyponatremia 12/31/2016  . Acute renal disease 12/31/2016  . Prosthetic joint infection ([place]) 12/31/2016  . Obese 07/19/2015  . Essential hypertension 11/06/2010     Health Maintenance Due  Topic Date Due  . COVID-19 Vaccine (1) Never done  . Hepatitis C Screening  Never done  . TETANUS/TDAP  Never done  .  COLONOSCOPY (Pts 45-36yrs Insurance coverage will need to be confirmed)  Never done     Review of Systems Review of Systems  Constitutional: Negative for fever, chills, diaphoresis, activity change, appetite change, fatigue and unexpected weight change.  HENT: Negative for congestion, sore throat, rhinorrhea, sneezing, trouble swallowing and sinus pressure.  Eyes: Negative for photophobia and visual disturbance.  Respiratory: Negative for cough, chest tightness, shortness of breath, wheezing and stridor.  Cardiovascular: Negative for chest pain, palpitations and leg swelling.  Gastrointestinal: Negative for nausea, vomiting, abdominal pain, diarrhea, constipation, blood in stool, abdominal distention and anal bleeding.  Genitourinary: Negative for dysuria, hematuria, flank pain and difficulty urinating.  Musculoskeletal: Negative for myalgias, back pain, joint swelling, arthralgias and gait problem.  Skin: Negative for color change, pallor, rash and wound.  Neurological: Negative for dizziness, tremors, weakness and light-headedness.  Hematological: Negative for adenopathy. Does not bruise/bleed easily.  Psychiatric/Behavioral: Negative for behavioral problems, confusion, sleep disturbance, dysphoric mood, decreased concentration and agitation.    Physical Exam   BP (!) 167/79   Pulse (!) 56   Temp 97.9 F (36.6 C) (Oral)   Ht 5\' 7"  (1.702 m)   Wt 240 lb (108.9 kg)   SpO2 100%   BMI 37.59 kg/m    No results found for: CD4TCELL No results found for: CD4TABS No results found for: HIV1RNAQUANT No results found for: HEPBSAB No results found for: RPR,  LABRPR  CBC Lab Results  Component Value Date   WBC 5.0 10/15/2018   RBC 4.01 (L) 10/15/2018   RBC 4.01 (L) 10/15/2018   HGB 12.6 (L) 10/15/2018   HCT 39.4 10/15/2018   PLT 219 10/15/2018   MCV 98.3 10/15/2018   MCH 31.4 10/15/2018   MCHC 32.0 10/15/2018   RDW 13.7 10/15/2018   LYMPHSABS 1.7 10/15/2018   MONOABS 0.6  10/15/2018   EOSABS 0.2 10/15/2018    BMET Lab Results  Component Value Date   NA 131 (L) 09/12/2020   K 4.3 09/12/2020   CL 99 09/12/2020   CO2 23 09/12/2020   GLUCOSE 85 09/12/2020   BUN 20 09/12/2020   CREATININE 0.87 09/12/2020   CALCIUM 9.6 09/12/2020   GFRNONAA >60 10/15/2018   GFRAA >60 10/15/2018      Assessment and Plan  Chronic MSSA pji = continue with lifelong doxycycline. Will see dr Alvan Dame in august - lets do yearly visit.  - call if any problem with abtx - refill - will check cbc and bmp

## 2021-03-20 LAB — BASIC METABOLIC PANEL
BUN: 24 mg/dL (ref 7–25)
CO2: 24 mmol/L (ref 20–32)
Calcium: 9.1 mg/dL (ref 8.6–10.3)
Chloride: 97 mmol/L — ABNORMAL LOW (ref 98–110)
Creat: 0.97 mg/dL (ref 0.70–1.25)
Glucose, Bld: 101 mg/dL — ABNORMAL HIGH (ref 65–99)
Potassium: 4.6 mmol/L (ref 3.5–5.3)
Sodium: 132 mmol/L — ABNORMAL LOW (ref 135–146)

## 2021-03-20 LAB — CBC WITH DIFFERENTIAL/PLATELET
Absolute Monocytes: 586 cells/uL (ref 200–950)
Basophils Absolute: 21 cells/uL (ref 0–200)
Basophils Relative: 0.5 %
Eosinophils Absolute: 62 cells/uL (ref 15–500)
Eosinophils Relative: 1.5 %
HCT: 38.7 % (ref 38.5–50.0)
Hemoglobin: 13 g/dL — ABNORMAL LOW (ref 13.2–17.1)
Lymphs Abs: 1345 cells/uL (ref 850–3900)
MCH: 33.7 pg — ABNORMAL HIGH (ref 27.0–33.0)
MCHC: 33.6 g/dL (ref 32.0–36.0)
MCV: 100.3 fL — ABNORMAL HIGH (ref 80.0–100.0)
MPV: 12.3 fL (ref 7.5–12.5)
Monocytes Relative: 14.3 %
Neutro Abs: 2087 cells/uL (ref 1500–7800)
Neutrophils Relative %: 50.9 %
Platelets: 178 10*3/uL (ref 140–400)
RBC: 3.86 10*6/uL — ABNORMAL LOW (ref 4.20–5.80)
RDW: 13.1 % (ref 11.0–15.0)
Total Lymphocyte: 32.8 %
WBC: 4.1 10*3/uL (ref 3.8–10.8)

## 2021-10-01 ENCOUNTER — Other Ambulatory Visit: Payer: Self-pay | Admitting: Internal Medicine

## 2021-10-01 DIAGNOSIS — T8450XS Infection and inflammatory reaction due to unspecified internal joint prosthesis, sequela: Secondary | ICD-10-CM

## 2022-03-04 ENCOUNTER — Encounter: Payer: Self-pay | Admitting: Internal Medicine

## 2022-03-04 ENCOUNTER — Ambulatory Visit (INDEPENDENT_AMBULATORY_CARE_PROVIDER_SITE_OTHER): Payer: Worker's Compensation | Admitting: Internal Medicine

## 2022-03-04 ENCOUNTER — Other Ambulatory Visit: Payer: Self-pay

## 2022-03-04 VITALS — BP 171/75 | HR 55 | Temp 98.1°F | Wt 237.6 lb

## 2022-03-04 DIAGNOSIS — Z87891 Personal history of nicotine dependence: Secondary | ICD-10-CM

## 2022-03-04 DIAGNOSIS — I10 Essential (primary) hypertension: Secondary | ICD-10-CM

## 2022-03-04 DIAGNOSIS — T887XXA Unspecified adverse effect of drug or medicament, initial encounter: Secondary | ICD-10-CM | POA: Diagnosis not present

## 2022-03-04 DIAGNOSIS — T8450XS Infection and inflammatory reaction due to unspecified internal joint prosthesis, sequela: Secondary | ICD-10-CM

## 2022-03-04 DIAGNOSIS — T8451XS Infection and inflammatory reaction due to internal right hip prosthesis, sequela: Secondary | ICD-10-CM

## 2022-03-04 MED ORDER — DOXYCYCLINE HYCLATE 100 MG PO TABS: 100.0000 mg | ORAL_TABLET | Freq: Two times a day (BID) | ORAL | 3 refills | Status: DC

## 2022-03-04 NOTE — Progress Notes (Signed)
RFV: follow up for chronic mssa pji of right hip  Patient ID: [name], male   DOB: 1961-09-11, 61 y.o.   MRN: [id number]  HPI  61yo M with redo MSSA right hip pji, July 2019, on chronic doxycycline. Retired in 2022. Comes to clinic to see annually for how he is tolerating abtx. Has had some arthritis in left hip for which he has had steroid injection in aug 2022 then improved. Has upcoming apt in august 2023 with dr Alvan Dame. Overall doing well.   Outpatient Encounter Medications as of 03/04/2022  Medication Sig   amLODipine (NORVASC) 2.5 MG tablet Take 2.5 mg by mouth daily.   atorvastatin (LIPITOR) 20 MG tablet Take 20 mg daily by mouth.   carvedilol (COREG) 12.5 MG tablet Take 12.5 mg by mouth 2 (two) times daily.   celecoxib (CELEBREX) 200 MG capsule Take 200 mg by mouth daily.   doxycycline (VIBRA-TABS) 100 MG tablet TAKE 1 TABLET BY MOUTH TWICE A DAY   ketoconazole (NIZORAL) 2 % cream 1 app   losartan (COZAAR) 100 MG tablet Take 1 tablet by mouth daily.   Multiple Vitamin (MULTIVITAMIN WITH MINERALS) TABS tablet Take 1 tablet by mouth daily.   sildenafil (VIAGRA) 100 MG tablet Take 1 po 30 minutes prior to sexual activity.  Do not exceed 1 dose/24 hours   [DISCONTINUED] losartan (COZAAR) 50 MG tablet Take 1 tablet by mouth daily. (Patient not taking: Reported on 03/04/2022)   No facility-administered encounter medications on file as of 03/04/2022.     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  HENT: Negative for congestion, sore throat, rhinorrhea, sneezing, trouble swallowing and sinus pressure.  Eyes: Negative for photophobia and visual disturbance.  Respiratory: Negative for cough, chest tightness, shortness of breath, wheezing and stridor.  Cardiovascular: Negative for chest pain, palpitations and leg swelling.  Gastrointestinal: Negative for nausea, vomiting, abdominal pain, diarrhea, constipation, blood in stool, abdominal distention and anal bleeding.  Genitourinary: Negative for dysuria, hematuria, flank pain and difficulty urinating.  Musculoskeletal: Negative for myalgias, back pain, joint swelling, arthralgias and gait problem.  Skin: Negative for color change, pallor, rash and wound.  Neurological: Negative for dizziness, tremors, weakness and light-headedness.  Hematological: Negative for adenopathy. Does not bruise/bleed easily.  Psychiatric/Behavioral: Negative for behavioral problems, confusion, sleep disturbance, dysphoric mood, decreased concentration and agitation.   Physical Exam   BP (!) 171/75   Pulse (!) 55   Temp 98.1 F (36.7 C) (Temporal)   Wt 237 lb 9.6 oz (107.8 kg)   SpO2 100%   BMI 37.21 kg/m    Gen= a xo by 3 in nad Ext= hip = FROM no issues Skin = no erythema  CBC Lab Results  Component Value Date   WBC 4.1 03/19/2021   RBC 3.86 (L) 03/19/2021   HGB  13.0 (L) 03/19/2021   HCT 38.7 03/19/2021   PLT 178 03/19/2021   MCV 100.3 (H) 03/19/2021   MCH 33.7 (H) 03/19/2021   MCHC 33.6 03/19/2021   RDW 13.1 03/19/2021   LYMPHSABS 1,345 03/19/2021   MONOABS 0.6 10/15/2018   EOSABS 62 03/19/2021    BMET Lab Results  Component Value Date   NA 132 (L) 03/19/2021   K 4.6 03/19/2021   CL 97 (L) 03/19/2021   CO2 24 03/19/2021   GLUCOSE 101 (H) 03/19/2021   BUN 24  03/19/2021   CREATININE 0.97 03/19/2021   CALCIUM 9.1 03/19/2021   GFRNONAA >60 10/15/2018   GFRAA >60 10/15/2018      Assessment and Plan  Htn = "white coat syndrome" ; now on diovan '100mg'$ ; he reports only elevated at md visits. Continue to monitor at home   MSSA chronic hip pji = will plan on continue on doxycycline and refill abtx. Will check inflammatory markers and cbc/bmp.   Side effects of abtx = warned patient about sun sensitivity with doxy. Recommend sunscreen SPF50+, long sleeves/hat when outdoors  Osteoarthritis =Continue to exercise at [employer] as tolerated.  Rtc in 1 yr. Sooner if needed

## 2022-03-05 LAB — BASIC METABOLIC PANEL
BUN: 21 mg/dL (ref 7–25)
CO2: 26 mmol/L (ref 20–32)
Calcium: 9 mg/dL (ref 8.6–10.3)
Chloride: 99 mmol/L (ref 98–110)
Creat: 1.04 mg/dL (ref 0.70–1.35)
Glucose, Bld: 94 mg/dL (ref 65–99)
Potassium: 4.6 mmol/L (ref 3.5–5.3)
Sodium: 130 mmol/L — ABNORMAL LOW (ref 135–146)

## 2022-03-05 LAB — CBC WITH DIFFERENTIAL/PLATELET
Absolute Monocytes: 607 cells/uL (ref 200–950)
Basophils Absolute: 21 cells/uL (ref 0–200)
Basophils Relative: 0.5 %
Eosinophils Absolute: 82 cells/uL (ref 15–500)
Eosinophils Relative: 2 %
HCT: 36.7 % — ABNORMAL LOW (ref 38.5–50.0)
Hemoglobin: 12.6 g/dL — ABNORMAL LOW (ref 13.2–17.1)
Lymphs Abs: 1095 cells/uL (ref 850–3900)
MCH: 33.6 pg — ABNORMAL HIGH (ref 27.0–33.0)
MCHC: 34.3 g/dL (ref 32.0–36.0)
MCV: 97.9 fL (ref 80.0–100.0)
MPV: 12.1 fL (ref 7.5–12.5)
Monocytes Relative: 14.8 %
Neutro Abs: 2296 cells/uL (ref 1500–7800)
Neutrophils Relative %: 56 %
Platelets: 199 10*3/uL (ref 140–400)
RBC: 3.75 10*6/uL — ABNORMAL LOW (ref 4.20–5.80)
RDW: 13.1 % (ref 11.0–15.0)
Total Lymphocyte: 26.7 %
WBC: 4.1 10*3/uL (ref 3.8–10.8)

## 2022-03-05 LAB — C-REACTIVE PROTEIN: CRP: 0.9 mg/L (ref <8.0)

## 2022-03-05 LAB — SEDIMENTATION RATE: Sed Rate: 9 mm/h (ref 0–20)

## 2022-03-18 ENCOUNTER — Ambulatory Visit: Payer: Self-pay | Admitting: Internal Medicine

## 2023-03-03 ENCOUNTER — Ambulatory Visit: Payer: Worker's Compensation | Admitting: Internal Medicine

## 2023-03-03 ENCOUNTER — Other Ambulatory Visit: Payer: Self-pay

## 2023-03-03 ENCOUNTER — Encounter: Payer: Self-pay | Admitting: Internal Medicine

## 2023-03-03 VITALS — BP 154/87 | HR 56 | Temp 97.9°F | Resp 16 | Wt 240.0 lb

## 2023-03-03 DIAGNOSIS — T8451XD Infection and inflammatory reaction due to internal right hip prosthesis, subsequent encounter: Secondary | ICD-10-CM | POA: Diagnosis not present

## 2023-03-03 DIAGNOSIS — I10 Essential (primary) hypertension: Secondary | ICD-10-CM | POA: Diagnosis not present

## 2023-03-03 DIAGNOSIS — T8450XS Infection and inflammatory reaction due to unspecified internal joint prosthesis, sequela: Secondary | ICD-10-CM

## 2023-03-03 LAB — CBC WITH DIFFERENTIAL/PLATELET
MCH: 32.8 pg (ref 27.0–33.0)
MCHC: 34 g/dL (ref 32.0–36.0)
RDW: 12.4 % (ref 11.0–15.0)

## 2023-03-03 NOTE — Progress Notes (Signed)
Patient ID: [name], male   DOB: March 26, 1961, 62 y.o.   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 He often has white coat syndrome. His bp checks at home range sbp of 130s  Still works 2 days per week  Outpatient Encounter Medications as of 03/03/2023  Medication Sig   amLODipine (NORVASC) 2.5 MG tablet Take 2.5 mg by mouth daily.   atorvastatin (LIPITOR) 20 MG tablet Take 20 mg daily by mouth.   carvedilol (COREG) 12.5 MG tablet Take 12.5 mg by mouth 2 (two) times daily.   celecoxib (CELEBREX) 200 MG capsule Take 200 mg by mouth daily.   doxycycline (VIBRA-TABS) 100 MG tablet Take 1 tablet (100 mg total) by mouth 2 (two) times daily.   ketoconazole (NIZORAL) 2 % cream 1 app   losartan (COZAAR) 100 MG tablet Take 1 tablet by mouth daily.   Multiple Vitamin (MULTIVITAMIN WITH MINERALS) TABS tablet Take 1 tablet by mouth daily.   sildenafil (VIAGRA) 100 MG tablet Take 1 po 30 minutes prior to sexual activity.  Do not exceed 1 dose/24 hours   No facility-administered encounter medications on file as of 03/03/2023.     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  HENT: Negative for congestion, sore throat, rhinorrhea, sneezing, trouble swallowing and sinus pressure.  Eyes: Negative for photophobia and visual disturbance.  Respiratory: Negative for cough, chest tightness, shortness of breath, wheezing and stridor.  Cardiovascular: Negative for chest pain, palpitations and leg swelling.  Gastrointestinal: Negative for nausea, vomiting, abdominal pain, diarrhea, constipation, blood in stool, abdominal distention and anal bleeding.  Genitourinary: Negative for dysuria, hematuria, flank pain and difficulty urinating.  Musculoskeletal: Negative for myalgias, back pain, joint swelling, arthralgias and gait problem.  Skin: Negative for color change, pallor, rash and wound.  Neurological: Negative for dizziness, tremors, weakness and light-headedness.  Hematological: Negative for adenopathy. Does not bruise/bleed easily.  Psychiatric/Behavioral: Negative for behavioral problems, confusion, sleep disturbance, dysphoric mood, decreased concentration and agitation.   Physical Exam   BP (!) 179/81   Pulse (!) 56   Temp 97.9 F (36.6 C) (Oral)   Resp 16   Wt 240 lb (108.9 kg)   SpO2 100%   BMI 37.59 kg/m    Physical Exam  Constitutional: He is oriented to person, place, and time. He appears well-developed and well-nourished. No distress.  HENT:  Mouth/Throat: Oropharynx is clear and moist. No oropharyngeal exudate.  Cardiovascular: Normal rate, regular rhythm and normal heart sounds. Exam reveals no gallop and no friction rub.  No murmur  heard.  Pulmonary/Chest: Effort normal and breath sounds normal. No respiratory distress. He has no wheezes.  Lymphadenopathy:  He has no cervical adenopathy.  Neurological: He is alert and oriented to person, place, and time.  Skin: Skin is warm and dry. No rash noted. No erythema.  Psychiatric: He has a normal mood and affect. His behavior is  normal.    CBC Lab Results  Component Value Date   WBC 4.1 03/04/2022   RBC 3.75 (L) 03/04/2022   HGB 12.6 (L) 03/04/2022   HCT 36.7 (L) 03/04/2022   PLT 199 03/04/2022   MCV 97.9 03/04/2022   MCH 33.6 (H) 03/04/2022   MCHC 34.3 03/04/2022   RDW 13.1 03/04/2022   LYMPHSABS 1,095 03/04/2022   MONOABS 0.6 10/15/2018   EOSABS 82 03/04/2022    BMET Lab Results  Component Value Date   NA 130 (L) 03/04/2022   K 4.6 03/04/2022   CL 99 03/04/2022   CO2 26 03/04/2022   GLUCOSE 94 03/04/2022   BUN 21 03/04/2022   CREATININE 1.04 03/04/2022   CALCIUM 9.0 03/04/2022   GFRNONAA >60 10/15/2018   GFRAA >60 10/15/2018   Lab Results  Component Value Date   ESRSEDRATE 9 03/04/2022   Lab Results  Component Value Date   ESRSEDRATE 6 03/03/2023   Lab Results  Component Value Date   CRP <3.0 03/03/2023      Assessment and Plan 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 Did ask to follow up with PCP and continue to check BP at home  At next visit will do further Consideration of discussion when to stop chronic proph

## 2023-03-04 LAB — BASIC METABOLIC PANEL WITH GFR
BUN: 20 mg/dL (ref 7–25)
CO2: 25 mmol/L (ref 20–32)
Calcium: 9.4 mg/dL (ref 8.6–10.3)
Chloride: 103 mmol/L (ref 98–110)
Creat: 0.93 mg/dL (ref 0.70–1.35)
Glucose, Bld: 101 mg/dL — ABNORMAL HIGH (ref 65–99)
Potassium: 4.6 mmol/L (ref 3.5–5.3)
Sodium: 135 mmol/L (ref 135–146)

## 2023-03-04 LAB — CBC WITH DIFFERENTIAL/PLATELET
Absolute Monocytes: 564 cells/uL (ref 200–950)
Basophils Absolute: 20 cells/uL (ref 0–200)
Basophils Relative: 0.5 %
Eosinophils Absolute: 72 cells/uL (ref 15–500)
Eosinophils Relative: 1.8 %
HCT: 39.1 % (ref 38.5–50.0)
Hemoglobin: 13.3 g/dL (ref 13.2–17.1)
Lymphs Abs: 1272 cells/uL (ref 850–3900)
MCV: 96.3 fL (ref 80.0–100.0)
MPV: 13 fL — ABNORMAL HIGH (ref 7.5–12.5)
Monocytes Relative: 14.1 %
Neutro Abs: 2072 cells/uL (ref 1500–7800)
Neutrophils Relative %: 51.8 %
Platelets: 184 10*3/uL (ref 140–400)
RBC: 4.06 10*6/uL — ABNORMAL LOW (ref 4.20–5.80)
Total Lymphocyte: 31.8 %
WBC: 4 10*3/uL (ref 3.8–10.8)

## 2023-03-04 LAB — SEDIMENTATION RATE: Sed Rate: 6 mm/h (ref 0–20)

## 2023-03-04 LAB — C-REACTIVE PROTEIN: CRP: <3 mg/L (ref <8.0)

## 2023-03-16 ENCOUNTER — Other Ambulatory Visit: Payer: Self-pay | Admitting: Internal Medicine

## 2023-03-16 DIAGNOSIS — T8450XS Infection and inflammatory reaction due to unspecified internal joint prosthesis, sequela: Secondary | ICD-10-CM

## 2023-09-02 ENCOUNTER — Ambulatory Visit: Payer: Worker's Compensation | Admitting: Internal Medicine

## 2023-09-02 ENCOUNTER — Encounter: Payer: Self-pay | Admitting: Internal Medicine

## 2023-09-02 ENCOUNTER — Other Ambulatory Visit: Payer: Self-pay

## 2023-09-02 VITALS — BP 181/70 | HR 53 | Temp 97.5°F | Ht 67.0 in | Wt 216.0 lb

## 2023-09-02 DIAGNOSIS — T8450XS Infection and inflammatory reaction due to unspecified internal joint prosthesis, sequela: Secondary | ICD-10-CM

## 2023-09-02 DIAGNOSIS — Z79899 Other long term (current) drug therapy: Secondary | ICD-10-CM

## 2023-09-02 DIAGNOSIS — T8451XD Infection and inflammatory reaction due to internal right hip prosthesis, subsequent encounter: Secondary | ICD-10-CM

## 2023-09-02 DIAGNOSIS — I10 Essential (primary) hypertension: Secondary | ICD-10-CM | POA: Diagnosis not present

## 2023-09-02 MED ORDER — DOXYCYCLINE HYCLATE 100 MG PO TABS: 100.0000 mg | ORAL_TABLET | Freq: Two times a day (BID) | ORAL | 5 refills | Status: DC

## 2023-09-02 NOTE — Progress Notes (Signed)
Patient ID: [name], male   DOB: Feb 10, 1961, 62 y.o.   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 hesistant to stop oral abtx chronic suppression since he has had 2 revisions (last infection occurred while he was off of abtx for 6 month prior to recurrence)/  Doing well without issues and tolerating doxycycline. Discussed with dr Charlann Boxer to continue with chronic suppression  Off of celebrex -- now on meloxicam. 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 Now getting back to shape  Dietary changes to help loose weight  No issues.  Outpatient Encounter Medications as of 09/02/2023  Medication Sig   atorvastatin (LIPITOR) 20 MG tablet Take 20 mg daily by mouth.   carvedilol (COREG) 12.5 MG tablet Take 12.5 mg by mouth 2 (two) times daily.   doxycycline (VIBRA-TABS) 100 MG tablet TAKE 1 TABLET BY MOUTH TWICE A DAY   ketoconazole (NIZORAL) 2 % cream 1 app   losartan (COZAAR) 100 MG tablet Take 1 tablet by mouth daily.   meloxicam (MOBIC) 15 MG tablet Take 15 mg by mouth daily.   Multiple Vitamin (MULTIVITAMIN WITH MINERALS) TABS tablet Take 1 tablet by mouth daily.   sildenafil (VIAGRA) 100 MG tablet Take 1 po 30 minutes prior to sexual activity.  Do not exceed 1 dose/24 hours   amLODipine (NORVASC) 2.5 MG tablet Take 2.5 mg by mouth daily. (Patient not taking: Reported on 09/02/2023)   celecoxib (CELEBREX) 200 MG capsule Take 200 mg by mouth daily. (Patient not taking: Reported on 09/02/2023)   No facility-administered encounter medications on file as of 09/02/2023.     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  HENT: Negative for congestion, sore throat, rhinorrhea, sneezing, trouble swallowing and sinus pressure.  Eyes: Negative for photophobia and visual disturbance.  Respiratory: Negative for cough, chest tightness, shortness of breath, wheezing and stridor.  Cardiovascular: Negative for chest pain, palpitations and leg swelling.  Gastrointestinal: Negative for nausea, vomiting, abdominal pain, diarrhea, constipation, blood in stool, abdominal distention and anal bleeding.  Genitourinary: Negative for dysuria, hematuria, flank pain and difficulty urinating.  Musculoskeletal: Negative for myalgias, back pain, joint swelling, arthralgias and gait problem.  Skin: Negative for color change, pallor, rash and wound.  Neurological: Negative for dizziness, tremors, weakness and light-headedness.  Hematological: Negative for adenopathy. Does not bruise/bleed easily.  Psychiatric/Behavioral: Negative for behavioral problems, confusion, sleep disturbance, dysphoric mood, decreased concentration and agitation.   Physical Exam   BP (!) 181/70   Pulse (!) 53   Temp (!) 97.5 F (36.4 C) (Temporal)   Ht 5\' 7"  (1.702 m)   Wt 216 lb (98 kg)   SpO2 100%   BMI 33.83 kg/m    Physical Exam  Constitutional: He is oriented  to person, place, and time. He appears well-developed and well-nourished. No distress.  HENT:  Mouth/Throat: Oropharynx is clear and moist. No oropharyngeal exudate.  Cardiovascular: Normal rate, regular rhythm and normal heart sounds. Exam reveals no gallop and no friction rub.  No murmur heard.  Pulmonary/Chest: Effort normal and breath sounds normal. No respiratory distress.  He has no wheezes.  Neurological: He is alert and oriented to person, place, and time.  Skin: Skin is warm and dry. No rash noted. No erythema.  Psychiatric: He has a normal mood and affect. His behavior is normal.    CBC Lab Results  Component Value Date   WBC 4.0 03/03/2023   RBC 4.06 (L) 03/03/2023   HGB 13.3 03/03/2023   HCT 39.1 03/03/2023   PLT 184 03/03/2023   MCV 96.3 03/03/2023   MCH 32.8 03/03/2023   MCHC 34.0 03/03/2023   RDW 12.4 03/03/2023   LYMPHSABS 1,272 03/03/2023   MONOABS 0.6 10/15/2018   EOSABS 72 03/03/2023    BMET Lab Results  Component Value Date   NA 135 03/03/2023   K 4.6 03/03/2023   CL 103 03/03/2023   CO2 25 03/03/2023   GLUCOSE 101 (H) 03/03/2023   BUN 20 03/03/2023   CREATININE 0.93 03/03/2023   CALCIUM 9.4 03/03/2023   GFRNONAA >60 10/15/2018   GFRAA >60 10/15/2018      Assessment and Plan  Chronic right hip pji = will refill doxy and check inflammatory labs  Long term medication management = will check cr  Health maintenance = up to date with flu and covid  White coat syndrome = hypertensive at this visit. On repeat check BP much improved

## 2023-09-03 LAB — CBC WITH DIFFERENTIAL/PLATELET
Absolute Lymphocytes: 1069 {cells}/uL (ref 850–3900)
Absolute Monocytes: 569 {cells}/uL (ref 200–950)
Basophils Absolute: 31 {cells}/uL (ref 0–200)
Basophils Relative: 0.8 %
Eosinophils Absolute: 101 {cells}/uL (ref 15–500)
Eosinophils Relative: 2.6 %
HCT: 40 % (ref 38.5–50.0)
Hemoglobin: 13.4 g/dL (ref 13.2–17.1)
MCH: 32.5 pg (ref 27.0–33.0)
MCHC: 33.5 g/dL (ref 32.0–36.0)
MCV: 97.1 fL (ref 80.0–100.0)
MPV: 12.4 fL (ref 7.5–12.5)
Monocytes Relative: 14.6 %
Neutro Abs: 2129 {cells}/uL (ref 1500–7800)
Neutrophils Relative %: 54.6 %
Platelets: 204 10*3/uL (ref 140–400)
RBC: 4.12 10*6/uL — ABNORMAL LOW (ref 4.20–5.80)
RDW: 12.9 % (ref 11.0–15.0)
Total Lymphocyte: 27.4 %
WBC: 3.9 10*3/uL (ref 3.8–10.8)

## 2023-09-03 LAB — BASIC METABOLIC PANEL
BUN: 18 mg/dL (ref 7–25)
CO2: 27 mmol/L (ref 20–32)
Calcium: 9.1 mg/dL (ref 8.6–10.3)
Chloride: 98 mmol/L (ref 98–110)
Creat: 1.01 mg/dL (ref 0.70–1.35)
Glucose, Bld: 91 mg/dL (ref 65–99)
Potassium: 4.6 mmol/L (ref 3.5–5.3)
Sodium: 132 mmol/L — ABNORMAL LOW (ref 135–146)

## 2023-09-03 LAB — C-REACTIVE PROTEIN: CRP: <3 mg/L (ref <8.0)

## 2023-09-03 LAB — SEDIMENTATION RATE: Sed Rate: 9 mm/h (ref 0–20)

## 2023-09-10 ENCOUNTER — Other Ambulatory Visit: Payer: Self-pay | Admitting: Medical Genetics

## 2023-09-17 ENCOUNTER — Other Ambulatory Visit: Payer: Self-pay | Admitting: Medical Genetics

## 2023-10-13 ENCOUNTER — Other Ambulatory Visit (HOSPITAL_COMMUNITY)
Admission: RE | Admit: 2023-10-13 | Discharge: 2023-10-13 | Disposition: A | Discharge status: Home / Self Care | Payer: Self-pay | Source: Ambulatory Visit | Attending: Oncology | Admitting: Oncology

## 2023-10-25 LAB — GENECONNECT MOLECULAR SCREEN: Genetic Analysis Overall Interpretation: NEGATIVE

## 2024-03-01 ENCOUNTER — Ambulatory Visit: Payer: Worker's Compensation | Admitting: Internal Medicine

## 2024-03-01 ENCOUNTER — Encounter: Payer: Self-pay | Admitting: Internal Medicine

## 2024-03-01 ENCOUNTER — Other Ambulatory Visit: Payer: Self-pay

## 2024-03-01 VITALS — BP 175/84 | HR 49 | Resp 16 | Ht 67.0 in | Wt 209.0 lb

## 2024-03-01 DIAGNOSIS — I10 Essential (primary) hypertension: Secondary | ICD-10-CM | POA: Diagnosis not present

## 2024-03-01 DIAGNOSIS — Z79899 Other long term (current) drug therapy: Secondary | ICD-10-CM

## 2024-03-01 DIAGNOSIS — T8450XS Infection and inflammatory reaction due to unspecified internal joint prosthesis, sequela: Secondary | ICD-10-CM

## 2024-03-01 DIAGNOSIS — T8451XD Infection and inflammatory reaction due to internal right hip prosthesis, subsequent encounter: Secondary | ICD-10-CM

## 2024-03-01 MED ORDER — DOXYCYCLINE HYCLATE 100 MG PO TABS: 100.0000 mg | ORAL_TABLET | Freq: Two times a day (BID) | ORAL | 5 refills | Status: AC

## 2024-03-01 NOTE — Progress Notes (Unsigned)
    Patient ID: [name], male   DOB: 09-18-1961, 63 y.o.   MRN: [id number]  HPI 63yo M with hx of MSSA right hip pji. Every day pain is okay but notices if he is long drive (beyond 1 hr) Continued on doxycycline  Back to the gym regularly, Right chronic hip pain. ? Sciatica type pain now. Outpatient Encounter Medications as of 03/01/2024  Medication Sig   atorvastatin  (LIPITOR) 20 MG tablet Take 20 mg daily by mouth.   carvedilol  (COREG ) 12.5 MG tablet Take 12.5 mg by mouth 2 (two) times daily.   doxycycline  (VIBRA -TABS) 100 MG tablet Take 1 tablet (100 mg total) by mouth 2 (two) times daily.   ketoconazole (NIZORAL) 2 % cream 1 app   losartan (COZAAR) 100 MG tablet Take 1 tablet by mouth daily.   meloxicam (MOBIC) 15 MG tablet Take 15 mg by mouth daily.   Multiple Vitamin (MULTIVITAMIN WITH MINERALS) TABS tablet Take 1 tablet by mouth daily.   sildenafil (VIAGRA) 100 MG tablet Take 1 po 30 minutes prior to sexual activity.  Do not exceed 1 dose/24 hours   amLODipine  (NORVASC ) 2.5 MG tablet Take 2.5 mg by mouth daily. (Patient not taking: Reported on 03/01/2024)   celecoxib  (CELEBREX ) 200 MG capsule Take 200 mg by mouth daily. (Patient not taking: Reported on 03/01/2024)   No facility-administered encounter medications on file as of 03/01/2024.     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   Acute hyponatremia 12/31/2016   Acute renal disease 12/31/2016   Prosthetic joint infection (HCC) 12/31/2016   Obese 07/19/2015   Essential hypertension 11/06/2010     Health Maintenance Due  Topic Date Due   Hepatitis C Screening  Never done   COVID-19 Vaccine (8 - Moderna risk 2024-25 season) 02/04/2024     Review of Systems  Physical Exam   BP (!) 175/84   Pulse (!) 49   Resp 16   Ht 5\' 7"  (1.702 m)   Wt 209 lb (94.8 kg)   SpO2 99%   BMI 32.73 kg/m    No results found for:  "CD4TCELL" No results found for: "CD4TABS" No results found for: "HIV1RNAQUANT" No results found for: "HEPBSAB" No results found for: "RPR", "LABRPR"  CBC Lab Results  Component Value Date   WBC 3.9 09/02/2023   RBC 4.12 (L) 09/02/2023   HGB 13.4 09/02/2023   HCT 40.0 09/02/2023   PLT 204 09/02/2023   MCV 97.1 09/02/2023   MCH 32.5 09/02/2023   MCHC 33.5 09/02/2023   RDW 12.9 09/02/2023   LYMPHSABS 1,272 03/03/2023   MONOABS 0.6 10/15/2018   EOSABS 101 09/02/2023    BMET Lab Results  Component Value Date   NA 132 (L) 09/02/2023   K 4.6 09/02/2023   CL 98 09/02/2023   CO2 27 09/02/2023   GLUCOSE 91 09/02/2023   BUN 18 09/02/2023   CREATININE 1.01 09/02/2023   CALCIUM  9.1 09/02/2023   GFRNONAA >60 10/15/2018   GFRAA >60 10/15/2018      Assessment and Plan  Repeat labs today Knows to wear hat and sunscreen with doxycyline  Rtc 6 months

## 2024-03-02 LAB — BASIC METABOLIC PANEL WITH GFR
BUN: 19 mg/dL (ref 7–25)
CO2: 26 mmol/L (ref 20–32)
Calcium: 9.1 mg/dL (ref 8.6–10.3)
Chloride: 104 mmol/L (ref 98–110)
Creat: 0.91 mg/dL (ref 0.70–1.35)
Glucose, Bld: 90 mg/dL (ref 65–99)
Potassium: 4.5 mmol/L (ref 3.5–5.3)
Sodium: 136 mmol/L (ref 135–146)
eGFR: 94 mL/min/{1.73_m2} (ref >=60)

## 2024-03-02 LAB — CBC WITH DIFFERENTIAL/PLATELET
Absolute Lymphocytes: 1231 {cells}/uL (ref 850–3900)
Absolute Monocytes: 517 {cells}/uL (ref 200–950)
Basophils Absolute: 19 {cells}/uL (ref 0–200)
Basophils Relative: 0.5 %
Eosinophils Absolute: 91 {cells}/uL (ref 15–500)
Eosinophils Relative: 2.4 %
HCT: 37.3 % — ABNORMAL LOW (ref 38.5–50.0)
Hemoglobin: 13.2 g/dL (ref 13.2–17.1)
MCH: 34.9 pg — ABNORMAL HIGH (ref 27.0–33.0)
MCHC: 35.4 g/dL (ref 32.0–36.0)
MCV: 98.7 fL (ref 80.0–100.0)
MPV: 11.6 fL (ref 7.5–12.5)
Monocytes Relative: 13.6 %
Neutro Abs: 1942 {cells}/uL (ref 1500–7800)
Neutrophils Relative %: 51.1 %
Platelets: 213 10*3/uL (ref 140–400)
RBC: 3.78 10*6/uL — ABNORMAL LOW (ref 4.20–5.80)
RDW: 12.9 % (ref 11.0–15.0)
Total Lymphocyte: 32.4 %
WBC: 3.8 10*3/uL (ref 3.8–10.8)

## 2024-03-02 LAB — SEDIMENTATION RATE: Sed Rate: 6 mm/h (ref 0–20)

## 2024-03-02 LAB — C-REACTIVE PROTEIN: CRP: <3 mg/L (ref <8.0)

## 2024-09-01 ENCOUNTER — Ambulatory Visit: Payer: Worker's Compensation | Admitting: Internal Medicine

## 2024-09-01 ENCOUNTER — Encounter: Payer: Self-pay | Admitting: Internal Medicine

## 2024-09-01 ENCOUNTER — Other Ambulatory Visit: Payer: Self-pay

## 2024-09-01 VITALS — BP 155/88 | HR 53 | Temp 97.5°F | Wt 207.0 lb

## 2024-09-01 DIAGNOSIS — T8450XS Infection and inflammatory reaction due to unspecified internal joint prosthesis, sequela: Secondary | ICD-10-CM

## 2024-09-01 DIAGNOSIS — I10 Essential (primary) hypertension: Secondary | ICD-10-CM

## 2024-09-01 DIAGNOSIS — T8451XS Infection and inflammatory reaction due to internal right hip prosthesis, sequela: Secondary | ICD-10-CM | POA: Diagnosis not present

## 2024-09-01 DIAGNOSIS — Z79899 Other long term (current) drug therapy: Secondary | ICD-10-CM

## 2024-09-01 NOTE — Progress Notes (Signed)
 Patient ID: [name], male   DOB: July 02, 1961, 63 y.o.   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 hesistant to stop oral abtx chronic suppression since he has had 2 revisions (last infection occurred while he was off of abtx for 6 month prior to recurrence)/    Last week went to see Dr. Ernie, just for ongoing chronic hip pain. Now plan to get MRI of right hip to evaluate the latest sciatica. Still on doxyclycine. Suspect pain more localized to hamstring. No self healing. Pain with lifting leg or crossing legs up with hip flexion.  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   doxycycline  (VIBRA -TABS) 100 MG tablet Take 1 tablet (100 mg total) by mouth 2 (two) times daily.   ketoconazole (NIZORAL) 2 % cream 1 app   losartan (COZAAR) 100 MG tablet Take 1 tablet by mouth daily.   meloxicam (MOBIC) 15 MG tablet Take 15 mg by mouth daily.   Multiple Vitamin (MULTIVITAMIN WITH MINERALS) TABS tablet Take 1 tablet by mouth daily.   sildenafil (VIAGRA) 100 MG tablet Take 1 po 30 minutes prior to sexual activity.  Do not exceed 1 dose/24 hours   amLODipine  (NORVASC ) 2.5 MG tablet Take 2.5 mg by mouth daily. (Patient not taking: Reported on 09/01/2024)   atorvastatin  (LIPITOR) 20 MG tablet Take 20 mg daily by mouth. (Patient not taking: Reported on 09/01/2024)   celecoxib  (CELEBREX ) 200 MG capsule Take 200 mg by mouth daily. (Patient not taking: Reported on 09/01/2024)   No facility-administered encounter medications on file as of 09/01/2024.     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   S/P left TH revision  05/12/2017   Acute hyponatremia 12/31/2016   Acute renal disease 12/31/2016   Prosthetic joint infection 12/31/2016   Obese 07/19/2015   Essential hypertension 11/06/2010     Health Maintenance Due  Topic Date Due   Hepatitis C Screening  Never done   Influenza Vaccine  05/14/2024   COVID-19 Vaccine (8 - 2025-26 season) 06/14/2024     Review of Systems 12 point ros except for right hip pain Physical Exam   BP (!) 178/76   Pulse (!) 53   Temp (!) 97.5 F (36.4 C) (Oral)   Wt 207 lb (93.9 kg)   SpO2 100%   BMI 32.42 kg/m    Physical Exam  Constitutional: He is oriented to person, place, and time. He appears well-developed and well-nourished. No distress.  HENT:  Mouth/Throat: Oropharynx is clear and moist. No oropharyngeal exudate.  Cardiovascular: Normal rate, regular rhythm and normal heart sounds. Exam reveals no gallop and no friction rub.  No murmur heard.  Pulmonary/Chest: Effort normal and breath sounds normal. No respiratory distress. He has no wheezes.  Abdominal: Soft. Bowel sounds are normal. He exhibits no distension. There is no tenderness.  Lymphadenopathy:  He has no cervical adenopathy.  Neurological: He is alert and oriented to person, place, and time.  Skin: Skin is warm and dry. No rash noted. No erythema.  Psychiatric: He has a normal mood and affect. His behavior is normal.  CBC Lab Results  Component Value Date   WBC 3.8 03/01/2024   RBC 3.78 (L) 03/01/2024   HGB 13.2 03/01/2024   HCT 37.3 (L) 03/01/2024   PLT 213 03/01/2024   MCV 98.7 03/01/2024   MCH 34.9 (H) 03/01/2024   MCHC 35.4 03/01/2024   RDW 12.9 03/01/2024   LYMPHSABS 1,272 03/03/2023   MONOABS 0.6 10/15/2018   EOSABS 91 03/01/2024    BMET Lab Results  Component Value Date   NA 136 03/01/2024   K 4.5 03/01/2024   CL 104 03/01/2024   CO2 26 03/01/2024   GLUCOSE 90 03/01/2024   BUN 19 03/01/2024   CREATININE 0.91 03/01/2024   CALCIUM  9.1 03/01/2024   GFRNONAA >60  10/15/2018   GFRAA >60 10/15/2018    Assessment and Plan Chronic right hip pji = Labs;refill doxy Dr ernie to plan to get mri of right hip for eval. Ask patient to call us  to when it is done so that we can see imaging  Long term medication = will check cr, sed rate and crp  Hypertension = suffers from white coat syndrome. No issues with taking BP meds. Has upcoming PCP appt early dec. Repeat BP check

## 2024-09-02 LAB — CBC WITH DIFFERENTIAL/PLATELET
Absolute Lymphocytes: 1156 {cells}/uL (ref 850–3900)
Absolute Monocytes: 475 {cells}/uL (ref 200–950)
Basophils Absolute: 18 {cells}/uL (ref 0–200)
Basophils Relative: 0.5 %
Eosinophils Absolute: 90 {cells}/uL (ref 15–500)
Eosinophils Relative: 2.5 %
HCT: 38.5 % (ref 38.5–50.0)
Hemoglobin: 13 g/dL — ABNORMAL LOW (ref 13.2–17.1)
MCH: 32.7 pg (ref 27.0–33.0)
MCHC: 33.8 g/dL (ref 32.0–36.0)
MCV: 97 fL (ref 80.0–100.0)
MPV: 11.7 fL (ref 7.5–12.5)
Monocytes Relative: 13.2 %
Neutro Abs: 1861 {cells}/uL (ref 1500–7800)
Neutrophils Relative %: 51.7 %
Platelets: 210 Thousand/uL (ref 140–400)
RBC: 3.97 Million/uL — ABNORMAL LOW (ref 4.20–5.80)
RDW: 13.1 % (ref 11.0–15.0)
Total Lymphocyte: 32.1 %
WBC: 3.6 Thousand/uL — ABNORMAL LOW (ref 3.8–10.8)

## 2024-09-02 LAB — C-REACTIVE PROTEIN: CRP: <3 mg/L (ref <8.0)

## 2024-09-02 LAB — BASIC METABOLIC PANEL WITH GFR
BUN: 18 mg/dL (ref 7–25)
CO2: 25 mmol/L (ref 20–32)
Calcium: 9.1 mg/dL (ref 8.6–10.3)
Chloride: 101 mmol/L (ref 98–110)
Creat: 0.8 mg/dL (ref 0.70–1.35)
Glucose, Bld: 85 mg/dL (ref 65–99)
Potassium: 4.5 mmol/L (ref 3.5–5.3)
Sodium: 133 mmol/L — ABNORMAL LOW (ref 135–146)
eGFR: 98 mL/min/1.73m2 (ref >=60)

## 2024-09-02 LAB — SEDIMENTATION RATE: Sed Rate: 2 mm/h (ref 0–20)

## 2025-02-28 ENCOUNTER — Ambulatory Visit: Admitting: Internal Medicine
